# Patient Record
Sex: Male | Born: 1950 | Race: Black or African American | Hispanic: No | Smoking: Current every day smoker
Health system: Southern US, Community
[De-identification: ages and names within clinical notes are randomized; demographics above are authoritative.]

## PROBLEM LIST (undated history)

## (undated) DIAGNOSIS — M1990 Unspecified osteoarthritis, unspecified site: Secondary | ICD-10-CM

## (undated) DIAGNOSIS — I10 Essential (primary) hypertension: Secondary | ICD-10-CM

## (undated) DIAGNOSIS — I739 Peripheral vascular disease, unspecified: Secondary | ICD-10-CM

## (undated) DIAGNOSIS — E785 Hyperlipidemia, unspecified: Secondary | ICD-10-CM

## (undated) DIAGNOSIS — H409 Unspecified glaucoma: Secondary | ICD-10-CM

## (undated) HISTORY — PX: COLONOSCOPY W/ POLYPECTOMY: SHX1380

## (undated) HISTORY — PX: FOOT SURGERY: SHX648

---

## 2011-08-23 ENCOUNTER — Ambulatory Visit: Payer: Self-pay | Admitting: Physician Assistant

## 2011-08-23 VITALS — BP 126/83 | HR 67 | Temp 98.0°F | Resp 16 | Ht 68.0 in | Wt 145.2 lb

## 2011-08-23 DIAGNOSIS — Z0289 Encounter for other administrative examinations: Secondary | ICD-10-CM

## 2011-08-23 NOTE — Progress Notes (Signed)
Patient ID: DANIS PEMBLETON MRN: [id number], DOB: 1950-06-11 61 y.o. Date of Encounter: 08/23/2011, 12:46 PM  Primary Physician: No primary provider on file.  Chief Complaint: DOT Physical   HPI: 61 y.o. y/o male with history noted below here for DOT physical. Doing well. No issues/complaints. Recertification. 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 Drinks 2 beers per day after work. PCP [facility].   Review of Systems: Consitutional: No fever, chills, fatigue, night sweats, lymphadenopathy, or weight changes. Eyes: No visual changes, eye redness, or discharge. ENT/Mouth: Ears: No otalgia, tinnitus, hearing loss, discharge. Nose: No congestion, rhinorrhea, sinus pain, or epistaxis. Throat: No sore throat, post nasal drip, or teeth pain. Cardiovascular: No CP, palpitations, diaphoresis, DOE, edema, orthopnea, PND. Respiratory: No cough, hemoptysis, SOB, or wheezing. Gastrointestinal: No anorexia, dysphagia, reflux, pain, nausea, vomiting, hematemesis, diarrhea, constipation, BRBPR, or melena. Genitourinary: No dysuria, frequency, urgency, hematuria, incontinence, nocturia, decreased urinary stream, discharge, impotence, or testicular pain/masses. Musculoskeletal: No decreased ROM, myalgias, stiffness, joint swelling, or weakness. Skin: No rash, erythema, lesion changes, pain, warmth, jaundice, or pruritis. Neurological: No headache, dizziness, syncope, seizures, tremors, memory loss, coordination problems, or paresthesias. Psychological: No anxiety, depression, hallucinations, SI/HI. Endocrine: No fatigue, polydipsia, polyphagia, polyuria, or known diabetes. All other systems were reviewed and are otherwise negative.  No past medical history on file.   No past surgical history on file.  Home Meds:  Prior to Admission medications   Medication Sig Start Date End Date Taking? Authorizing Provider  aspirin 81 MG tablet Take 81 mg by mouth daily.   Yes  Historical Provider, MD    Allergies: No Known Allergies  History   Social History  . Marital Status: Single    Spouse Name: N/A    Number of Children: N/A  . Years of Education: N/A   Occupational History  . Not on file.   Social History Main Topics  . Smoking status: Current Everyday Smoker -- 1.0 packs/day    Types: Cigarettes  . Smokeless tobacco: Not on file  . Alcohol Use: Not on file  . Drug Use: Not on file  . Sexually Active: Not on file   Other Topics Concern  . Not on file   Social History Narrative  . No narrative on file    No family history on file.  Vision uncorrected: Right eye 20/30           Left eye 20/30           Both eyes 20/20 Peripheral vision : Right eye 85         Left eye 85 Hearing: Right ear: 10 feet      Left ear ear: 10 feet Urine: Sp. GR. 1.030 Protein: negative Blood: Trace Sugar: negatvie  Physical Exam: Blood pressure 126/83, pulse 67, temperature 98 F (36.7 C), temperature source Oral, resp. rate 16, height 5\' 8"  (1.727 m), weight 145 lb 3.2 oz (65.862 kg).  General: Well developed, well nourished, in no acute distress. HEENT: Normocephalic, atraumatic. Conjunctiva pink, sclera non-icteric. Pupils 2 mm constricting to 1 mm, round, regular, and equally reactive to light and accomodation. EOMI. Internal auditory canal clear. TMs with good cone of light and without pathology. Nasal mucosa pink. Nares are without discharge. No sinus tenderness. Oral mucosa pink. Dentition poor. Pharynx without exudate.   Neck: Supple. Trachea midline. No thyromegaly. Full ROM. No lymphadenopathy. Lungs: Clear to auscultation bilaterally without wheezes, rales, or rhonchi. Breathing is of normal effort  and unlabored. Cardiovascular: RRR with S1 S2. No murmurs, rubs, or gallops appreciated. Distal pulses 2+ symmetrically. No carotid or abdominal bruits. Abdomen: Soft, non-tender, non-distended with normoactive bowel sounds. No hepatosplenomegaly or  masses. No rebound/guarding. No CVA tenderness. Without hernias.  Genitourinary: Circumcised male. No penile lesions. Testes descended bilaterally, and smooth without tenderness or masses.  Musculoskeletal: Full range of motion and 5/5 strength throughout. Without swelling, atrophy, tenderness, crepitus, or warmth. Extremities without clubbing, cyanosis, or edema. Calves supple. Skin: Warm and moist without erythema, ecchymosis, wounds, or rash. Neuro: A+Ox3. CN II-XII grossly intact. Moves all extremities spontaneously. Full sensation throughout. Normal gait. DTR 2+ throughout upper and lower extremities. Finger to nose intact. Psych:  Responds to questions appropriately with a normal affect.    Assessment/Plan:  61 y.o. y/o male here for DOT physical. -Cleared -2 year card issued -Form completed -RTC prn -Follow up with [facility] for trace hematuria  Signed, Eula Listen, PA-C 08/23/2011 12:46 PM

## 2017-11-11 ENCOUNTER — Ambulatory Visit: Payer: Self-pay | Admitting: Cardiology

## 2017-11-14 ENCOUNTER — Ambulatory Visit (INDEPENDENT_AMBULATORY_CARE_PROVIDER_SITE_OTHER): Payer: No Typology Code available for payment source | Admitting: Cardiovascular Disease

## 2017-11-14 ENCOUNTER — Encounter: Payer: Self-pay | Admitting: Cardiovascular Disease

## 2017-11-14 VITALS — BP 117/78 | HR 84 | Ht 67.0 in | Wt 127.6 lb

## 2017-11-14 DIAGNOSIS — E7800 Pure hypercholesterolemia, unspecified: Secondary | ICD-10-CM

## 2017-11-14 DIAGNOSIS — I10 Essential (primary) hypertension: Secondary | ICD-10-CM

## 2017-11-14 DIAGNOSIS — Z720 Tobacco use: Secondary | ICD-10-CM | POA: Diagnosis not present

## 2017-11-14 DIAGNOSIS — E785 Hyperlipidemia, unspecified: Secondary | ICD-10-CM | POA: Insufficient documentation

## 2017-11-14 DIAGNOSIS — I739 Peripheral vascular disease, unspecified: Secondary | ICD-10-CM

## 2017-11-14 NOTE — Assessment & Plan Note (Signed)
History of mild hyperlipidemia not on statin therapy 

## 2017-11-14 NOTE — Progress Notes (Signed)
11/14/2017 Nathan SpillerWilliam R Caillier   Oct 28, 1950  [id number]  [facility] Primary Cardiologist: Runell GessJonathan J Berry MD Roseanne RenoFACP, FACC, FAHA, FSCAI  HPI:  Nathan Palmer is a 67 y.o. thin appearing divorced African-American male father of 3, grandfather one grandchild referred by the [facility] for evaluation of claudication.  He has a history of treated hypertension, untreated mild hyperlipidemia and greater than 50 pack years of tobacco abuse currently smoking 1 pack/day.  There is no family history of heart disease.  Never had a heart attack or stroke and denies chest pain or shortness of breath.  He has complained of right greater than left lower extremity lifestyle limiting claudication less than 1 block for the last year and was referred here for further evaluation of this.   Current Meds  Medication Sig  . acetaminophen (TYLENOL) 500 MG tablet Take 500 mg by mouth daily as needed.  Marland Kitchen. aspirin 81 MG tablet Take 81 mg by mouth daily.  Marland Kitchen. gabapentin (NEURONTIN) 300 MG capsule Take 300 mg by mouth 2 (two) times daily.  . hydrochlorothiazide (HYDRODIURIL) 25 MG tablet Take 25 mg by mouth daily.  . salsalate (DISALCID) 750 MG tablet Take 750 mg by mouth daily as needed.     No Known Allergies  Social History   Socioeconomic History  . Marital status: Single    Spouse name: Not on file  . Number of children: Not on file  . Years of education: Not on file  . Highest education level: Not on file  Occupational History  . Not on file  Social Needs  . Financial resource strain: Not on file  . Food insecurity:    Worry: Not on file    Inability: Not on file  . Transportation needs:    Medical: Not on file    Non-medical: Not on file  Tobacco Use  . Smoking status: Current Every Day Smoker    Packs/day: 1.00    Types: Cigarettes  . Smokeless tobacco: Never Used  Substance and Sexual Activity  . Alcohol use: Not on file  . Drug use: Not on  file  . Sexual activity: Not on file  Lifestyle  . Physical activity:    Days per week: Not on file    Minutes per session: Not on file  . Stress: Not on file  Relationships  . Social connections:    Talks on phone: Not on file    Gets together: Not on file    Attends religious service: Not on file    Active member of club or organization: Not on file    Attends meetings of clubs or organizations: Not on file    Relationship status: Not on file  . Intimate partner violence:    Fear of current or ex partner: Not on file    Emotionally abused: Not on file    Physically abused: Not on file    Forced sexual activity: Not on file  Other Topics Concern  . Not on file  Social History Narrative  . Not on file     Review of Systems: General: negative for chills, fever, night sweats or weight changes.  Cardiovascular: negative for chest pain, dyspnea on exertion, edema, orthopnea, palpitations, paroxysmal nocturnal dyspnea or shortness of breath Dermatological: negative for rash Respiratory: negative for cough or wheezing Urologic: negative for hematuria Abdominal: negative for nausea, vomiting, diarrhea, bright red blood per rectum, melena, or hematemesis Neurologic: negative for visual  changes, syncope, or dizziness All other systems reviewed and are otherwise negative except as noted above.    Blood pressure 117/78, pulse 84, height 5\' 7"  (1.702 m), weight 127 lb 9.6 oz (57.9 kg).  General appearance: alert and no distress Neck: no adenopathy, no carotid bruit, no JVD, supple, symmetrical, trachea midline and thyroid not enlarged, symmetric, no tenderness/mass/nodules Lungs: clear to auscultation bilaterally Heart: regular rate and rhythm, S1, S2 normal, no murmur, click, rub or gallop Extremities: extremities normal, atraumatic, no cyanosis or edema Pulses: 2+ and symmetric Skin: Skin color, texture, turgor normal. No rashes or lesions Neurologic: Alert and oriented X 3,  normal strength and tone. Normal symmetric reflexes. Normal coordination and gait  EKG sinus rhythm 84 without ST or T wave changes.  There were septal Q waves.  I personally reviewed this EKG.  ASSESSMENT AND PLAN:   Claudication in peripheral vascular disease (HCC) History of lifestyle limiting claudication right greater than left for greater than 1 year.  We will repeat lower extremity arterial Doppler studies.  Essential hypertension History of essential hypertension with blood pressure measured today 117/78.  He is on HydroDIURIL.  Hyperlipidemia History of mild hyperlipidemia not on statin therapy.  Tobacco abuse History of ongoing tobacco abuse of 1 pack/day for last 50 years.      Runell Gess MD FACP,FACC,FAHA, [facility] 11/14/2017 2:04 PM

## 2017-11-14 NOTE — Assessment & Plan Note (Signed)
History of ongoing tobacco abuse of 1 pack/day for last 50 years.

## 2017-11-14 NOTE — Assessment & Plan Note (Signed)
History of lifestyle limiting claudication right greater than left for greater than 1 year.  We will repeat lower extremity arterial Doppler studies.

## 2017-11-14 NOTE — Patient Instructions (Signed)
Medication Instructions:   NO CHANGE  Testing/Procedures:  Your physician has requested that you have a lower extremity arterial duplex. This test is an ultrasound of the arteries in the legs or arms. It looks at arterial blood flow in the legs and arms. Allow one hour for Lower and Upper Arterial scans. There are no restrictions or special instructions   Follow-Up:  Your physician recommends that you schedule a follow-up appointment in: WITH DR BERRY AFTER TESTING COMPLETE

## 2017-11-14 NOTE — Assessment & Plan Note (Signed)
History of essential hypertension with blood pressure measured today 117/78.  He is on HydroDIURIL.

## 2017-11-21 ENCOUNTER — Other Ambulatory Visit: Payer: Self-pay | Admitting: Cardiovascular Disease

## 2017-11-21 DIAGNOSIS — I739 Peripheral vascular disease, unspecified: Secondary | ICD-10-CM

## 2017-12-01 ENCOUNTER — Ambulatory Visit (HOSPITAL_COMMUNITY)
Admission: RE | Admit: 2017-12-01 | Discharge: 2017-12-01 | Disposition: A | Discharge status: Home / Self Care | Payer: No Typology Code available for payment source | Source: Ambulatory Visit | Attending: Cardiovascular Disease | Admitting: Cardiovascular Disease

## 2017-12-01 ENCOUNTER — Telehealth: Payer: Self-pay | Admitting: Cardiovascular Disease

## 2017-12-01 ENCOUNTER — Inpatient Hospital Stay (HOSPITAL_COMMUNITY): Admission: RE | Admit: 2017-12-01 | Payer: Self-pay | Source: Ambulatory Visit

## 2017-12-01 DIAGNOSIS — I739 Peripheral vascular disease, unspecified: Secondary | ICD-10-CM | POA: Insufficient documentation

## 2017-12-01 NOTE — Telephone Encounter (Signed)
[facility] on 12/01/17, Appt 12/03/17 @ 10:00AM. NV

## 2017-12-03 ENCOUNTER — Other Ambulatory Visit (HOSPITAL_COMMUNITY): Payer: Self-pay

## 2017-12-03 ENCOUNTER — Other Ambulatory Visit (HOSPITAL_COMMUNITY): Payer: Self-pay | Admitting: Cardiovascular Disease

## 2017-12-03 ENCOUNTER — Telehealth: Payer: Self-pay

## 2017-12-03 ENCOUNTER — Encounter: Payer: Self-pay | Admitting: Cardiovascular Disease

## 2017-12-03 ENCOUNTER — Ambulatory Visit (HOSPITAL_COMMUNITY)
Admission: RE | Admit: 2017-12-03 | Discharge: 2017-12-03 | Disposition: A | Discharge status: Home / Self Care | Payer: No Typology Code available for payment source | Source: Ambulatory Visit | Attending: Cardiovascular Disease | Admitting: Cardiovascular Disease

## 2017-12-03 ENCOUNTER — Ambulatory Visit (INDEPENDENT_AMBULATORY_CARE_PROVIDER_SITE_OTHER): Payer: Non-veteran care | Admitting: Cardiovascular Disease

## 2017-12-03 ENCOUNTER — Other Ambulatory Visit: Payer: Self-pay | Admitting: Cardiovascular Disease

## 2017-12-03 DIAGNOSIS — I739 Peripheral vascular disease, unspecified: Secondary | ICD-10-CM | POA: Insufficient documentation

## 2017-12-03 LAB — CBC
HEMATOCRIT: 40.6 % (ref 37.5–51.0)
HEMOGLOBIN: 14.1 g/dL (ref 13.0–17.7)
MCH: 32.9 pg (ref 26.6–33.0)
MCHC: 34.7 g/dL (ref 31.5–35.7)
MCV: 95 fL (ref 79–97)
PLATELETS: 189 10*3/uL (ref 150–450)
RBC: 4.29 x10E6/uL (ref 4.14–5.80)
RDW: 13.5 % (ref 12.3–15.4)
WBC: 5 10*3/uL (ref 3.4–10.8)

## 2017-12-03 LAB — BASIC METABOLIC PANEL
BUN / CREAT RATIO: 16 (ref 10–24)
BUN: 17 mg/dL (ref 8–27)
CALCIUM: 9.8 mg/dL (ref 8.6–10.2)
CHLORIDE: 105 mmol/L (ref 96–106)
CO2: 25 mmol/L (ref 20–29)
CREATININE: 1.06 mg/dL (ref 0.76–1.27)
GFR, EST AFRICAN AMERICAN: 84 mL/min/{1.73_m2} (ref >59)
GFR, EST NON AFRICAN AMERICAN: 72 mL/min/{1.73_m2} (ref >59)
Glucose: 90 mg/dL (ref 65–99)
Potassium: 5.3 mmol/L — ABNORMAL HIGH (ref 3.5–5.2)
Sodium: 139 mmol/L (ref 134–144)

## 2017-12-03 NOTE — H&P (View-Only) (Signed)
Mr. Nathan Palmer returns today for follow-up of his Doppler studies performed in evaluation of lifestyle limiting right lower extremely claudication.  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  His left leg without significant disease.  The plan is to perform angiography and potential endovascular therapy next week.  Runell GessJonathan J. Shakenya Stoneberg, M.D., FACP, [facility]FACC, Earl LagosFAHA, [facility]SCAI [facility] [address] [place], [place]  [address]  [phone] 12/03/2017 10:42 AM

## 2017-12-03 NOTE — Patient Instructions (Addendum)
    [facility] [address] [place] [address] Dept: [phone] Loc: [phone]  Levonne SpillerWilliam R Kleppe  12/03/2017  You are scheduled for a Peripheral Angiogram on Thursday, August 21 with Dr. Nanetta BattyJonathan Berry.  1. [facility]: [address] [place], [place] [address] at 9:30 AM (This time is two hours before your procedure to ensure your preparation). Free valet parking service is available.   Special note: Every effort is made to have your procedure done on time. Please understand that emergencies sometimes delay scheduled procedures.  2. Diet: Do not eat solid foods after midnight.  The patient may have clear liquids until 5am upon the day of the procedure.  3. Labs: You will need to have blood drawn on TODAY  4. Medication instructions in preparation for your procedure:  On the morning of your procedure, take your Aspirin and any morning medicines NOT listed above.  You may use sips of water.  5. Plan for one night stay--bring personal belongings. 6. Bring a current list of your medications and current insurance cards. 7. You MUST have a responsible person to drive you home. 8. Someone MUST be with you the first 24 hours after you arrive home or your discharge will be delayed. 9. Please wear clothes that are easy to get on and off and wear slip-on shoes.  Thank you for allowing us to care for you!   -- [facility]    DOPPLERS ONE WEEK POST PROCEDURE   Your physician recommends that you schedule a follow-up appointment in: 2-3 WEEKS POST PROCEDURE WITH DR Allyson SabalBERRY

## 2017-12-03 NOTE — Assessment & Plan Note (Signed)
Mr. Nathan Palmer returns today for follow-up of his Doppler studies performed in evaluation of lifestyle limiting right lower extremely claudication.  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  His left leg without significant disease.  The plan is to perform angiography and potential endovascular therapy next week.

## 2017-12-03 NOTE — Telephone Encounter (Signed)
RECEIVED CALL

## 2017-12-03 NOTE — Progress Notes (Signed)
Mr. Humbarger returns today for follow-up of his Doppler studies performed in evaluation of lifestyle limiting right lower extremely claudication.  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  His left leg without significant disease.  The plan is to perform angiography and potential endovascular therapy next week.  Vandell Kun J. Redonna Wilbert, M.D., FACP, FACC, FAHA, FSCAI [facility] [address] [place], [place]  [address]  [phone] 12/03/2017 10:42 AM  

## 2017-12-04 ENCOUNTER — Ambulatory Visit (HOSPITAL_COMMUNITY)
Admission: RE | Admit: 2017-12-04 | Discharge: 2017-12-05 | Disposition: A | Discharge status: Home / Self Care | Payer: No Typology Code available for payment source | Source: Ambulatory Visit | Attending: Cardiovascular Disease | Admitting: Cardiovascular Disease

## 2017-12-04 ENCOUNTER — Ambulatory Visit (HOSPITAL_COMMUNITY)
Admission: RE | Disposition: A | Discharge status: Home / Self Care | Payer: Self-pay | Source: Ambulatory Visit | Attending: Cardiovascular Disease

## 2017-12-04 ENCOUNTER — Telehealth: Payer: Self-pay | Admitting: Surgery

## 2017-12-04 ENCOUNTER — Encounter (HOSPITAL_COMMUNITY): Payer: Self-pay | Admitting: *Deleted

## 2017-12-04 ENCOUNTER — Other Ambulatory Visit: Payer: Self-pay

## 2017-12-04 DIAGNOSIS — Z7902 Long term (current) use of antithrombotics/antiplatelets: Secondary | ICD-10-CM | POA: Insufficient documentation

## 2017-12-04 DIAGNOSIS — Z7982 Long term (current) use of aspirin: Secondary | ICD-10-CM | POA: Insufficient documentation

## 2017-12-04 DIAGNOSIS — I70213 Atherosclerosis of native arteries of extremities with intermittent claudication, bilateral legs: Secondary | ICD-10-CM | POA: Insufficient documentation

## 2017-12-04 DIAGNOSIS — F17210 Nicotine dependence, cigarettes, uncomplicated: Secondary | ICD-10-CM | POA: Insufficient documentation

## 2017-12-04 DIAGNOSIS — I739 Peripheral vascular disease, unspecified: Secondary | ICD-10-CM | POA: Diagnosis present

## 2017-12-04 DIAGNOSIS — I70212 Atherosclerosis of native arteries of extremities with intermittent claudication, left leg: Secondary | ICD-10-CM

## 2017-12-04 DIAGNOSIS — Z23 Encounter for immunization: Secondary | ICD-10-CM | POA: Insufficient documentation

## 2017-12-04 DIAGNOSIS — I10 Essential (primary) hypertension: Secondary | ICD-10-CM | POA: Insufficient documentation

## 2017-12-04 DIAGNOSIS — E785 Hyperlipidemia, unspecified: Secondary | ICD-10-CM | POA: Diagnosis not present

## 2017-12-04 HISTORY — DX: Hyperlipidemia, unspecified: E78.5

## 2017-12-04 HISTORY — PX: ABDOMINAL AORTOGRAM W/LOWER EXTREMITY: CATH118223

## 2017-12-04 HISTORY — DX: Essential (primary) hypertension: I10

## 2017-12-04 HISTORY — PX: PERIPHERAL VASCULAR INTERVENTION: CATH118257

## 2017-12-04 HISTORY — DX: Peripheral vascular disease, unspecified: I73.9

## 2017-12-04 HISTORY — PX: ABDOMINAL AORTAGRAM: SHX5706

## 2017-12-04 LAB — POCT ACTIVATED CLOTTING TIME
ACTIVATED CLOTTING TIME: 246 s
Activated Clotting Time: 285 seconds
Activated Clotting Time: 191 seconds
Activated Clotting Time: 164 seconds

## 2017-12-04 SURGERY — ABDOMINAL AORTOGRAM W/LOWER EXTREMITY
Anesthesia: LOCAL

## 2017-12-04 MED ORDER — NITROGLYCERIN 1 MG/10 ML FOR IR/CATH LAB
INTRA_ARTERIAL | Status: AC
  Filled 2017-12-04: qty 10

## 2017-12-04 MED ORDER — LABETALOL HCL 5 MG/ML IV SOLN: 10.0000 mg | INTRAVENOUS | Status: DC | PRN

## 2017-12-04 MED ORDER — SODIUM CHLORIDE 0.9 % IV SOLN: 250.0000 mL | INTRAVENOUS | Status: DC | PRN

## 2017-12-04 MED ORDER — ATORVASTATIN CALCIUM 80 MG PO TABS
80.0000 mg | ORAL_TABLET | Freq: Every day | ORAL | Status: DC
  Administered 2017-12-04: 80 mg via ORAL
  Filled 2017-12-04: qty 1

## 2017-12-04 MED ORDER — HYDRALAZINE HCL 20 MG/ML IJ SOLN: 5.0000 mg | INTRAMUSCULAR | Status: DC | PRN

## 2017-12-04 MED ORDER — SODIUM CHLORIDE 0.9 % IV SOLN: INTRAVENOUS | Status: AC

## 2017-12-04 MED ORDER — SODIUM CHLORIDE 0.9 % WEIGHT BASED INFUSION: 1.0000 mL/kg/h | INTRAVENOUS | Status: DC

## 2017-12-04 MED ORDER — HYDROCHLOROTHIAZIDE 25 MG PO TABS
25.0000 mg | ORAL_TABLET | Freq: Every day | ORAL | Status: DC
  Administered 2017-12-05: 25 mg via ORAL
  Filled 2017-12-04: qty 1

## 2017-12-04 MED ORDER — ACETAMINOPHEN 325 MG PO TABS: 650.0000 mg | ORAL_TABLET | ORAL | Status: DC | PRN

## 2017-12-04 MED ORDER — CLOPIDOGREL BISULFATE 300 MG PO TABS
ORAL_TABLET | ORAL | Status: DC | PRN
  Administered 2017-12-04: 300 mg via ORAL

## 2017-12-04 MED ORDER — FENTANYL CITRATE (PF) 100 MCG/2ML IJ SOLN
INTRAMUSCULAR | Status: AC
  Filled 2017-12-04: qty 2

## 2017-12-04 MED ORDER — CLOPIDOGREL BISULFATE 300 MG PO TABS
ORAL_TABLET | ORAL | Status: AC
  Filled 2017-12-04: qty 1

## 2017-12-04 MED ORDER — ONDANSETRON HCL 4 MG/2ML IJ SOLN: 4.0000 mg | Freq: Four times a day (QID) | INTRAMUSCULAR | Status: DC | PRN

## 2017-12-04 MED ORDER — SODIUM CHLORIDE 0.9% FLUSH: 3.0000 mL | INTRAVENOUS | Status: DC | PRN

## 2017-12-04 MED ORDER — MIDAZOLAM HCL 2 MG/2ML IJ SOLN
INTRAMUSCULAR | Status: AC
  Filled 2017-12-04: qty 2

## 2017-12-04 MED ORDER — SODIUM CHLORIDE 0.9% FLUSH: 3.0000 mL | Freq: Two times a day (BID) | INTRAVENOUS | Status: DC

## 2017-12-04 MED ORDER — LIDOCAINE HCL (PF) 1 % IJ SOLN
INTRAMUSCULAR | Status: AC
  Filled 2017-12-04: qty 30

## 2017-12-04 MED ORDER — ASPIRIN EC 81 MG PO TBEC
81.0000 mg | DELAYED_RELEASE_TABLET | Freq: Every day | ORAL | Status: DC
  Administered 2017-12-05: 81 mg via ORAL
  Filled 2017-12-04: qty 1

## 2017-12-04 MED ORDER — ANGIOPLASTY BOOK
Freq: Once | Status: AC
  Administered 2017-12-04: 21:00:00 1
  Filled 2017-12-04: qty 1

## 2017-12-04 MED ORDER — HEPARIN SODIUM (PORCINE) 1000 UNIT/ML IJ SOLN
INTRAMUSCULAR | Status: DC | PRN
  Administered 2017-12-04: 2500 [IU] via INTRAVENOUS
  Administered 2017-12-04: 7000 [IU] via INTRAVENOUS

## 2017-12-04 MED ORDER — FENTANYL CITRATE (PF) 100 MCG/2ML IJ SOLN
INTRAMUSCULAR | Status: DC | PRN
  Administered 2017-12-04: 25 ug via INTRAVENOUS

## 2017-12-04 MED ORDER — HEPARIN (PORCINE) IN NACL 1000-0.9 UT/500ML-% IV SOLN
INTRAVENOUS | Status: DC | PRN
  Administered 2017-12-04 (×2): 500 mL

## 2017-12-04 MED ORDER — ASPIRIN 81 MG PO CHEW: 81.0000 mg | CHEWABLE_TABLET | ORAL | Status: DC

## 2017-12-04 MED ORDER — ENSURE ENLIVE PO LIQD
237.0000 mL | Freq: Two times a day (BID) | ORAL | Status: DC
  Administered 2017-12-05: 237 mL via ORAL
  Filled 2017-12-04 (×4): qty 237

## 2017-12-04 MED ORDER — MIDAZOLAM HCL 2 MG/2ML IJ SOLN
INTRAMUSCULAR | Status: DC | PRN
  Administered 2017-12-04: 1 mg via INTRAVENOUS

## 2017-12-04 MED ORDER — LIDOCAINE HCL (PF) 1 % IJ SOLN
INTRAMUSCULAR | Status: DC | PRN
  Administered 2017-12-04: 15 mL via INTRADERMAL

## 2017-12-04 MED ORDER — HEPARIN (PORCINE) IN NACL 1000-0.9 UT/500ML-% IV SOLN
INTRAVENOUS | Status: AC
  Filled 2017-12-04: qty 1000

## 2017-12-04 MED ORDER — HEPARIN SODIUM (PORCINE) 1000 UNIT/ML IJ SOLN
INTRAMUSCULAR | Status: AC
  Filled 2017-12-04: qty 1

## 2017-12-04 MED ORDER — CLOPIDOGREL BISULFATE 75 MG PO TABS
75.0000 mg | ORAL_TABLET | Freq: Every day | ORAL | Status: DC
  Administered 2017-12-05: 75 mg via ORAL
  Filled 2017-12-04: qty 1

## 2017-12-04 MED ORDER — IODIXANOL 320 MG/ML IV SOLN
INTRAVENOUS | Status: DC | PRN
  Administered 2017-12-04: 200 mL via INTRA_ARTERIAL

## 2017-12-04 MED ORDER — PNEUMOCOCCAL VAC POLYVALENT 25 MCG/0.5ML IJ INJ
0.5000 mL | INJECTION | INTRAMUSCULAR | Status: AC
  Administered 2017-12-05: 0.5 mL via INTRAMUSCULAR
  Filled 2017-12-04: qty 0.5

## 2017-12-04 MED ORDER — MORPHINE SULFATE (PF) 10 MG/ML IV SOLN: 2.0000 mg | INTRAVENOUS | Status: DC | PRN

## 2017-12-04 MED ORDER — SODIUM CHLORIDE 0.9 % WEIGHT BASED INFUSION
3.0000 mL/kg/h | INTRAVENOUS | Status: DC
  Administered 2017-12-04: 3 mL/kg/h via INTRAVENOUS

## 2017-12-04 MED ORDER — ASPIRIN 81 MG PO TABS: 81.0000 mg | ORAL_TABLET | Freq: Every day | ORAL | Status: DC

## 2017-12-04 MED ORDER — ACETAMINOPHEN 500 MG PO TABS: 1000.0000 mg | ORAL_TABLET | Freq: Three times a day (TID) | ORAL | Status: DC | PRN

## 2017-12-04 MED ORDER — NITROGLYCERIN IN D5W 200-5 MCG/ML-% IV SOLN
INTRAVENOUS | Status: AC
  Filled 2017-12-04: qty 250

## 2017-12-04 SURGICAL SUPPLY — 27 items
BALLN MUSTANG 9X40X75 (BALLOONS) ×3
BALLOON MUSTANG 9X40X75 (BALLOONS) IMPLANT
CATH ANGIO 5F PIGTAIL 65CM (CATHETERS) ×2 IMPLANT
CATH STRAIGHT 5FR 65CM (CATHETERS) ×2 IMPLANT
DEVICE CONTINUOUS FLUSH (MISCELLANEOUS) ×1 IMPLANT
DIAMONDBACK SOLID OAS 2.0MM (CATHETERS) ×3
KIT ENCORE 26 ADVANTAGE (KITS) ×1 IMPLANT
KIT PV (KITS) ×3 IMPLANT
LUBRICANT VIPERSLIDE CORONARY (MISCELLANEOUS) ×1 IMPLANT
SHEATH BRITE TIP 7FR 35CM (SHEATH) ×1 IMPLANT
SHEATH BRITE TIP 8FR 35CM (SHEATH) ×1 IMPLANT
SHEATH PINNACLE 5F 10CM (SHEATH) ×1 IMPLANT
SHEATH PINNACLE 7F 10CM (SHEATH) IMPLANT
SHEATH PINNACLE 8F 10CM (SHEATH) ×1 IMPLANT
STENT EPIC VASCULAR 12X40X75 (Permanent Stent) ×1 IMPLANT
STENT VIABAHNBX 10X29X135 (Permanent Stent) ×1 IMPLANT
STOPCOCK MORSE 400PSI 3WAY (MISCELLANEOUS) ×1 IMPLANT
SYRINGE MEDRAD AVANTA MACH 7 (SYRINGE) ×1 IMPLANT
SYSTEM DIMNDBCK SLD OAS 2.0MM (CATHETERS) IMPLANT
TAPE VIPERTRACK RADIOPAQ (MISCELLANEOUS) IMPLANT
TAPE VIPERTRACK RADIOPAQUE (MISCELLANEOUS) ×3
TRANSDUCER W/STOPCOCK (MISCELLANEOUS) ×3 IMPLANT
TRAY PV CATH (CUSTOM PROCEDURE TRAY) ×3 IMPLANT
TUBING CIL FLEX 10 FLL-RA (TUBING) ×1 IMPLANT
WIRE HITORQ VERSACORE ST 145CM (WIRE) ×1 IMPLANT
WIRE VERSACORE LOC 115CM (WIRE) ×1 IMPLANT
WIRE VIPER ADVANCE .017X335CM (WIRE) ×1 IMPLANT

## 2017-12-04 NOTE — Progress Notes (Addendum)
628fr sheath aspirated and removed from LFA, manual pressure applied for 25 minutes. Groin level 0 tegaderm dressing applied, bedrest instructions given. Bilateral dp and pt pulses present per Vernona RiegerLaura.  Bedrest begins at 17:05:00

## 2017-12-04 NOTE — Interval H&P Note (Signed)
History and Physical Interval Note:  12/04/2017 12:28 PM  Nathan SpillerWilliam R Chizek  has presented today for surgery, with the diagnosis of pad  The various methods of treatment have been discussed with the patient and family. After consideration of risks, benefits and other options for treatment, the patient has consented to  Procedure(s): ABDOMINAL AORTOGRAM W/LOWER EXTREMITY (N/A) as a surgical intervention .  The patient's history has been reviewed, patient examined, no change in status, stable for surgery.  I have reviewed the patient's chart and labs.  Questions were answered to the patient's satisfaction.     Nanetta BattyJonathan Berry

## 2017-12-04 NOTE — Telephone Encounter (Signed)
sch appt phone NA mld ltr 01/05/18 830am New Pt

## 2017-12-05 ENCOUNTER — Encounter (HOSPITAL_COMMUNITY): Payer: Self-pay | Admitting: Cardiovascular Disease

## 2017-12-05 DIAGNOSIS — I739 Peripheral vascular disease, unspecified: Secondary | ICD-10-CM

## 2017-12-05 DIAGNOSIS — I70213 Atherosclerosis of native arteries of extremities with intermittent claudication, bilateral legs: Secondary | ICD-10-CM | POA: Diagnosis not present

## 2017-12-05 LAB — CBC
HCT: 34.1 % — ABNORMAL LOW (ref 39.0–52.0)
Hemoglobin: 11.7 g/dL — ABNORMAL LOW (ref 13.0–17.0)
MCH: 33.3 pg (ref 26.0–34.0)
MCHC: 34.3 g/dL (ref 30.0–36.0)
MCV: 97.2 fL (ref 78.0–100.0)
PLATELETS: 143 10*3/uL — AB (ref 150–400)
RBC: 3.51 MIL/uL — ABNORMAL LOW (ref 4.22–5.81)
RDW: 12.9 % (ref 11.5–15.5)
WBC: 5.5 10*3/uL (ref 4.0–10.5)

## 2017-12-05 LAB — BASIC METABOLIC PANEL
Anion gap: 8 (ref 5–15)
BUN: 11 mg/dL (ref 8–23)
CALCIUM: 8 mg/dL — AB (ref 8.9–10.3)
CO2: 24 mmol/L (ref 22–32)
CREATININE: 0.86 mg/dL (ref 0.61–1.24)
Chloride: 106 mmol/L (ref 98–111)
GFR calc Af Amer: >60 mL/min (ref >60)
GFR calc non Af Amer: >60 mL/min (ref >60)
GLUCOSE: 91 mg/dL (ref 70–99)
Potassium: 3.5 mmol/L (ref 3.5–5.1)
Sodium: 138 mmol/L (ref 135–145)

## 2017-12-05 MED ORDER — ATORVASTATIN CALCIUM 80 MG PO TABS: 80.0000 mg | ORAL_TABLET | Freq: Every day | ORAL | 3 refills | Status: AC

## 2017-12-05 MED ORDER — CLOPIDOGREL BISULFATE 75 MG PO TABS: 75.0000 mg | ORAL_TABLET | Freq: Every day | ORAL | 3 refills | Status: AC

## 2017-12-05 MED FILL — Nitroglycerin IV Soln 100 MCG/ML in D5W: INTRA_ARTERIAL | Qty: 10 | Status: AC

## 2017-12-05 MED FILL — Nitroglycerin IV Soln 200 MCG/ML in D5W: INTRAVENOUS | Qty: 250 | Status: AC

## 2017-12-05 NOTE — Progress Notes (Signed)
Progress Note  Patient Name: Nathan Palmer Date of Encounter: 12/05/2017  Primary Cardiologist: Nanetta Batty, MD   Subjective    Feeling well. No chest pain, sob or palpitations.   Inpatient Medications    Scheduled Meds: . aspirin EC  81 mg Oral Daily  . atorvastatin  80 mg Oral q1800  . clopidogrel  75 mg Oral Q breakfast  . feeding supplement (ENSURE ENLIVE)  237 mL Oral BID BM  . hydrochlorothiazide  25 mg Oral Daily  . pneumococcal 23 valent vaccine  0.5 mL Intramuscular Tomorrow-1000  . sodium chloride flush  3 mL Intravenous Q12H   Continuous Infusions: . sodium chloride     PRN Meds: sodium chloride, acetaminophen, hydrALAZINE, labetalol, morphine injection, ondansetron (ZOFRAN) IV, sodium chloride flush   Vital Signs    Vitals:   12/04/17 2100 12/05/17 0236 12/05/17 0300 12/05/17 0616  BP: 137/79 113/82 120/75 124/83  Pulse: 82 79 72 78  Resp: (!) 22 13 14 16   Temp:  98.5 F (36.9 C)  98.6 F (37 C)  TempSrc:  Oral  Oral  SpO2: 99% 96% 95% 96%  Weight:    56.7 kg  Height:        Intake/Output Summary (Last 24 hours) at 12/05/2017 0802 Last data filed at 12/05/2017 0237 Gross per 24 hour  Intake 693.75 ml  Output 1025 ml  Net -331.25 ml   Filed Weights   12/04/17 0843 12/05/17 0616  Weight: 58.5 kg 56.7 kg    Telemetry    SR  ECG    N/A  Physical Exam   GEN: No acute distress.   Neck: No JVD Cardiac: RRR, no murmurs, rubs, or gallops. R groin stable Respiratory: Clear to auscultation bilaterally. GI: Soft, nontender, non-distended  MS: No edema; No deformity. Neuro:  Nonfocal  Psych: Normal affect   Labs    Chemistry Recent Labs  Lab 12/03/17 1143 12/05/17 0232  NA 139 138  K 5.3* 3.5  CL 105 106  CO2 25 24  GLUCOSE 90 91  BUN 17 11  CREATININE 1.06 0.86  CALCIUM 9.8 8.0*  GFRNONAA 72 >60  GFRAA 84 >60  ANIONGAP  --  8     Hematology Recent Labs  Lab 12/03/17 1143 12/05/17 0232  WBC 5.0 5.5  RBC 4.29  3.51*  HGB 14.1 11.7*  HCT 40.6 34.1*  MCV 95 97.2  MCH 32.9 33.3  MCHC 34.7 34.3  RDW 13.5 12.9  PLT 189 143*    Radiology    Dg Chest 2 View  Result Date: 12/03/2017 CLINICAL DATA:  Preop stent placement. EXAM: CHEST - 2 VIEW COMPARISON:  None. FINDINGS: The heart size and mediastinal contours are within normal limits. Both lungs are clear. The visualized skeletal structures are unremarkable. IMPRESSION: No active cardiopulmonary disease. Electronically Signed   By: Elige Ko   On: 12/03/2017 14:19    Cardiac Studies   PV Angiogram/Intervention Procedures Performed:               1.  Abdominal aortogram/bilateral iliac angiogram/bifemoral runoff               2.  Diamondback orbital rotational atherectomy left common iliac and external iliac artery               3.  VBX covered stenting left common iliac artery               4.  Epic nitinol self-expanding stenting left external  iliac artery.  PROCEDURE DESCRIPTION:   The patient was brought to the second floor [facility]iac cath lab in the the postabsorptive state. He was premedicated with IV Versed and fentanyl. His left groin was prepped and shaved in usual sterile fashion. Xylocaine 1% was used for local anesthesia. A 5 French sheath was inserted into the left common femoral artery using standard Seldinger technique.  A 5 French pigtail catheter was placed in the distal abdominal aorta.  Distal abdominal aortography, bilateral iliac angiography with bifemoral runoff was performed using bolus chase, digital subtraction and step table technique.  Omnipaque dye was used for the entirety of the case.  Retrograde aortic pressure was monitored during the case.    Angiographic Data:   1: Abdominal aorta- fluoroscopically calcified but not aneurysmal or obstructive 2: Left lower extremity- calcified 80% diffuse segmental proximal and mid left common iliac artery stenosis, 90% calcified proximal left external iliac artery  stenosis with three-vessel runoff 3: Right lower extremity- diffuse 90% calcified right common and external iliac artery, occluded right common femoral and proximal right SFA with a calcified plaque, 90% focal mid right SFA and 95% calcified popliteal artery in the P2 segment with three-vessel runoff  IMPRESSION: Nathan Palmer has highly calcified iliac vessels bilaterally with an occluded right common femoral and proximal right SFA.  He has a high-grade focal calcified mid right SFA and popliteal artery with three-vessel runoff bilaterally.  He complains of right greater than left lower extremity claudication.  I reviewed the angiographic with Dr. Myra Gianotti who agrees that he will need surgical revascularization of his right common femoral artery with endarterectomy patch angioplasty and most likely orbital atherectomy of his right common and external iliac artery.  I will performed orbital atherectomy of the left common and external iliac artery with covered stenting of his common and nitinol self-expanding stenting of his left external iliac artery preserving his hypogastric artery.  Procedure Description: Patient received a total of 9500 units of heparin with an ACT of 285.  Total contrast measured the patient was 200 cc.  He did receive 300 mg of p.o. Plavix at the end of the case.  I placed an 8 French bright tip sheath in his mid left external iliac artery.  I put a Viper wire in the infra renal abdominal aorta and performed orbital atherectomy with a 2 mm solid bur up to 120,000 RPMs performing multiple passes.  I then used a 10 mm x 29 mm VBX covered stent to stent the proximal left common iliac artery.  I overlapped a 10 mm x 40 mm [employer] epic nitinol self-expanding stent in the distal left common and proximal left external iliac artery and postdilated with a 9 mm x 4 cm balloon resulting in reduction of 80% left common and 90% left external iliac artery stenosis to 0% residual.  Patient  tolerated the procedure well.  A distal abdominal aortogram was performed at the end of the case using the pigtail catheter.  The bright tip sheath with then exchanged over the 035 wire for a short 8 French sheath which was secured in place.  Final Impression: Successful diamondback orbital rotational atherectomy of the highly calcified high-grade segmentally occluded left common and external iliac artery with covered stenting using a VBX covered stent of the proximal left common iliac and nitinol self-expanding stent of the left external iliac artery post dilating with a 9 mm balloon.  The sheath will be removed once ACT falls below 170 and  pressure held.  Patient will be discharged home in the morning and will see me back in the office next week.  He will get lower extremity arterial Doppler studies.  Dr. Myra GianottiBrabham will then see him in the office for consultation with the intent to perform right common femoral endarterectomy with orbital atherectomy of the right common and external iliac artery intraoperatively  Patient Profile     67 y.o. male with hx of HTN, HLD and ongoing tobacco smoking presented for outpatient PV angiogram for right greater than left lower extremity lifestyle limiting claudication.  Assessment & Plan    1. PVD - s/p successful diamondback orbital rotational atherectomy of the highly calcified high-grade segmentally occluded left common and external iliac artery with covered stenting using a VBX covered stent of the proximal left common iliac and nitinol self-expanding stent of the left external iliac artery post dilating with a 9 mm balloon.  - Plan for outpatient R sided intervention by Dr. Myra GianottiBrabham. - Continue ASA, Plavix and Lipitor.   2. HLD - Continue high intensity statin.   3. HTN - BP stable on current medications.  For questions or updates, please contact [facility] Please consult [email or web address] for contact info under Cardiology/STEMI.       Signed, Manson PasseyBhavinkumar Shi Grose, PA  12/05/2017, 8:02 AM

## 2017-12-05 NOTE — Discharge Summary (Addendum)
Discharge Summary    Patient ID: Nathan Palmer,  MRN: [id number], DOB/AGE: [birth year] 67 y.o.  Admit date: 12/04/2017 Discharge date: 12/05/2017  Primary Care Provider: [facility] Primary Cardiologist: Nanetta Batty, MD  Discharge Diagnoses    Active Problems:   Claudication in peripheral vascular disease (HCC)   HTN   HLD  Tobacco smoking  Allergies No Known Allergies  Diagnostic Studies/Procedures    PV Angiogram/Intervention Procedures Performed: 1. Abdominal aortogram/bilateral iliac angiogram/bifemoral runoff 2. Diamondback orbital rotational atherectomy left common iliac and external iliac artery 3. VBX covered stenting left common iliac artery 4. Epic nitinol self-expanding stenting left external iliac artery.  PROCEDURE DESCRIPTION:  The patient was brought to the second floor [facility] lab in the the postabsorptive state. Hewas premedicated with IV Versed and fentanyl. Hisleft groin was prepped and shaved in usual sterile fashion. Xylocaine 1% was used for local anesthesia. A 5 French sheath was inserted into the left common femoral artery using standard Seldinger technique. A 5 French pigtail catheter was placed in the distal abdominal aorta. Distal abdominal aortography, bilateral iliac angiography with bifemoral runoff was performed using bolus chase, digital subtraction and step table technique. Omnipaque dye was used for the entirety of the case. Retrograde aortic pressure was monitored during the case.   Angiographic Data:   1: Abdominal aorta- fluoroscopically calcified but not aneurysmal or obstructive 2: Left lower extremity- calcified 80% diffuse segmental proximal and mid left common iliac artery stenosis, 90% calcified proximal left external iliac artery stenosis with three-vessel runoff 3: Right lower extremity- diffuse 90% calcified right common and  external iliac artery, occluded right common femoral and proximal right SFA with a calcified plaque, 90% focal mid right SFA and 95% calcified popliteal artery in the P2 segment with three-vessel runoff  IMPRESSION:Nathan Palmer has highly calcified iliac vessels bilaterally with an occluded right common femoral and proximal right SFA. He has a high-grade focal calcified mid right SFA and popliteal artery with three-vessel runoff bilaterally. He complains of right greater than left lower extremity claudication. I reviewed the angiographic with Dr. Myra Gianotti who agrees that he will need surgical revascularization of his right common femoral artery with endarterectomy patch angioplasty and most likely orbital atherectomy of his right common and external iliac artery. I will performed orbital atherectomy of the left common and external iliac artery with covered stenting of his common and nitinol self-expanding stenting of his left external iliac artery preserving his hypogastric artery.  Procedure Description: Patient received a total of 9500 units of heparin with an ACT of 285. Total contrast measured the patient was 200 cc. He did receive 300 mg of p.o. Plavix at the end of the case. I placed an 8 French bright tip sheath in his mid left external iliac artery. I put a Viper wire in the infra renal abdominal aorta and performed orbital atherectomy with a 2 mm solid bur up to 120,000 RPMs performing multiple passes. I then used a 10 mm x 29 mm VBX covered stent to stent the proximal left common iliac artery. I overlapped a 10 mm x 40 mm [employer] epic nitinol self-expanding stent in the distal left common and proximal left external iliac artery and postdilated with a 9 mm x 4 cm balloon resulting in reduction of 80% left common and 90% left external iliac artery stenosis to 0% residual. Patient tolerated the procedure well. A distal abdominal aortogram was performed at the end of the case using  the pigtail catheter. The bright tip sheath with then exchanged over the 035 wire for a short 8 French sheath which was secured in place.  Final Impression:Successful diamondback orbital rotational atherectomy of the highly calcified high-grade segmentally occluded left common and external iliac artery with covered stenting using a VBX covered stent of the proximal left common iliac and nitinol self-expanding stent of the left external iliac artery post dilating with a 9 mm balloon. The sheath will be removed once ACT falls below 170 and pressure held. Patient will be discharged home in the morning and will see me back in the office next week. He will get lower extremity arterial Doppler studies. Dr. Myra Gianotti will then see him in the office for consultation with the intent to perform right common femoral endarterectomy with orbital atherectomy of the right common and external iliac artery intraoperatively   History of Present Illness     67 y.o. male with hx of HTN, HLD and ongoing tobacco smoking presented for outpatient PV angiogram for right greater than left lower extremity lifestyle limiting claudication.  Hx of greater than 50 pack years of tobacco abuse, currently smoking 1 pack/day.  There is no family history of heart disease.  Never had a heart attack or stroke and denies chest pain or shortness of breath.  He has complained of right greater than left lower extremity lifestyle limiting claudication less than 1 block for the last year and was referred to Dr. Allyson Sabal for further evaluation.  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  His left leg without significant disease.  The plan is to perform angiography and potential endovascular therapy.   Hospital Course     Consultants: None  1. PVD - s/p successful diamondback orbital rotational atherectomy of the highly calcified high-grade segmentally occluded left common and external iliac artery with covered  stenting using a VBX covered stent of the proximal left common iliac and nitinol self-expanding stent of the left external iliac artery post dilating with a 9 mm balloon. outpatient study post intervention followed by office visit with Dr. Allyson Sabal.  - Plan for outpatient R sided intervention by Dr. Myra Gianotti. - Continue ASA, Plavix and Lipitor.   2. HLD - Continue high intensity statin.   3. HTN - BP stable on current medications.  Discharge Vitals Blood pressure 124/83, pulse 78, temperature 98.6 F (37 C), temperature source Oral, resp. rate 16, height 5' 7.5" (1.715 m), weight 56.7 kg, SpO2 96 %.  Filed Weights   12/04/17 0843 12/05/17 0616  Weight: 58.5 kg 56.7 kg    Labs & Radiologic Studies    CBC Recent Labs    12/03/17 1143 12/05/17 0232  WBC 5.0 5.5  HGB 14.1 11.7*  HCT 40.6 34.1*  MCV 95 97.2  PLT 189 143*   Basic Metabolic Panel Recent Labs    16/10/96 1143 12/05/17 0232  NA 139 138  K 5.3* 3.5  CL 105 106  CO2 25 24  GLUCOSE 90 91  BUN 17 11  CREATININE 1.06 0.86  CALCIUM 9.8 8.0*   _____________  Dg Chest 2 View  Result Date: 12/03/2017 CLINICAL DATA:  Preop stent placement. EXAM: CHEST - 2 VIEW COMPARISON:  None. FINDINGS: The heart size and mediastinal contours are within normal limits. Both lungs are clear. The visualized skeletal structures are unremarkable. IMPRESSION: No active cardiopulmonary disease. Electronically Signed   By: Elige Ko   On: 12/03/2017 14:19   Disposition   Pt is being  discharged home today in good condition.  Follow-up Plans & Appointments    Follow-up Information    [facility]. Go on 12/12/2017.   Specialty:  Cardiology Why:  @9am  for LE arterial doppler Contact information: [address] [place] [id number] [phone]       Runell GessBerry, Jonathan J, MD. Go on 12/24/2017.   Specialties:  Cardiology, Radiology Why:  @ 9am for post hospital follow up  Contact  information: [address] [place] [id number] [phone]        Nada LibmanBrabham, Vance W, MD. Go on 01/05/2018.   Specialties:  Vascular Surgery, Cardiology Why:  @8 :30am for vascular evaluation Contact information: [address] [place] [address] [phone]          Discharge Instructions    Diet - low sodium heart healthy   Complete by:  As directed    Discharge instructions   Complete by:  As directed    No driving for 72 hours. No lifting over 5 lbs for 1 week. No sexual activity for 1 week.  Keep procedure site clean & dry. If you notice increased pain, swelling, bleeding or pus, call/return!  You may shower, but no soaking baths/hot tubs/pools for 1 week.   Increase activity slowly   Complete by:  As directed       Discharge Medications   Allergies as of 12/05/2017   No Known Allergies     Medication List    TAKE these medications   acetaminophen 500 MG tablet Commonly known as:  TYLENOL Take 1,000 mg by mouth 3 (three) times daily as needed for mild pain.   aspirin 81 MG tablet Take 81 mg by mouth daily.   atorvastatin 80 MG tablet Commonly known as:  LIPITOR Take 1 tablet (80 mg total) by mouth daily at 6 PM.   clopidogrel 75 MG tablet Commonly known as:  PLAVIX Take 1 tablet (75 mg total) by mouth daily with breakfast.   gabapentin 300 MG capsule Commonly known as:  NEURONTIN Take 300 mg by mouth 2 (two) times daily.   hydrochlorothiazide 25 MG tablet Commonly known as:  HYDRODIURIL Take 25 mg by mouth daily.   latanoprost 0.005 % ophthalmic solution Commonly known as:  XALATAN Place 1 drop into both eyes at bedtime.   OVER THE COUNTER MEDICATION Apply 1 application topically daily as needed (rash). AMISH CREAM        Acute coronary syndrome (MI, NSTEMI, STEMI, etc) this admission?: No.    Outstanding Labs/Studies   LE arterial doppler   Duration of Discharge Encounter   Greater than 30 minutes including physician  time.  Lorelei PontSigned, Chantal Worthey, PA 12/05/2017, 10:56 AM

## 2017-12-12 ENCOUNTER — Ambulatory Visit (HOSPITAL_BASED_OUTPATIENT_CLINIC_OR_DEPARTMENT_OTHER)
Admission: RE | Admit: 2017-12-12 | Discharge: 2017-12-12 | Disposition: A | Discharge status: Home / Self Care | Payer: No Typology Code available for payment source | Source: Ambulatory Visit | Attending: Cardiovascular Disease | Admitting: Cardiovascular Disease

## 2017-12-12 ENCOUNTER — Ambulatory Visit (HOSPITAL_COMMUNITY)
Admission: RE | Admit: 2017-12-12 | Discharge: 2017-12-12 | Disposition: A | Discharge status: Home / Self Care | Payer: No Typology Code available for payment source | Source: Ambulatory Visit | Attending: Cardiovascular Disease | Admitting: Cardiovascular Disease

## 2017-12-12 DIAGNOSIS — I739 Peripheral vascular disease, unspecified: Secondary | ICD-10-CM

## 2017-12-24 ENCOUNTER — Ambulatory Visit (INDEPENDENT_AMBULATORY_CARE_PROVIDER_SITE_OTHER): Payer: No Typology Code available for payment source | Admitting: Cardiovascular Disease

## 2017-12-24 ENCOUNTER — Encounter: Payer: Self-pay | Admitting: Cardiovascular Disease

## 2017-12-24 VITALS — BP 111/75 | HR 91 | Ht 69.0 in | Wt 131.4 lb

## 2017-12-24 DIAGNOSIS — E782 Mixed hyperlipidemia: Secondary | ICD-10-CM | POA: Diagnosis not present

## 2017-12-24 DIAGNOSIS — Z01818 Encounter for other preprocedural examination: Secondary | ICD-10-CM

## 2017-12-24 NOTE — Assessment & Plan Note (Signed)
History of essential hypertension blood pressure measured at 111/75.  He is on hydrochlorothiazide.

## 2017-12-24 NOTE — Progress Notes (Signed)
12/24/2017 Nathan Palmer   Dec 27, 1950  [id number]  Primary Physician [facility] Primary Cardiologist: Runell Gess MD [name]  HPI:  Nathan Palmer is a 67 y.o.  thin appearing divorced African-American male father of 3, grandfather one grandchild referred by the [facility] for evaluation of claudication.    I last saw him in the office 11/14/2017.  He has a history of treated hypertension, untreated mild hyperlipidemia and greater than 50 pack years of tobacco abuse currently smoking 1 pack/day.  There is no family history of heart disease.  Never had a heart attack or stroke and denies chest pain or shortness of breath.  He has complained of right greater than left lower extremity lifestyle limiting claudication less than 1 block for the last year and was referred here for further evaluation of this. I performed peripheral angiography 12/04/2017 revealing bilateral iliac disease as well as right common femoral SFA and popliteal disease.  I performed Dynabac orbital rotational atherectomy, PTA and stenting of his left common and external iliac artery.  His ABIs did not change but his velocities improved.  His claudication on the left has markedly improved as well.  He has an appointment to see Dr. Myra Gianotti on 01/05/2018 to discuss surgical recatheterization of his right lower extremity.  Current Meds  Medication Sig  . aspirin 81 MG tablet Take 81 mg by mouth daily.  Marland Kitchen atorvastatin (LIPITOR) 80 MG tablet Take 1 tablet (80 mg total) by mouth daily at 6 PM.  . clopidogrel (PLAVIX) 75 MG tablet Take 1 tablet (75 mg total) by mouth daily with breakfast.  . gabapentin (NEURONTIN) 300 MG capsule Take 300 mg by mouth 2 (two) times daily.  . hydrochlorothiazide (HYDRODIURIL) 25 MG tablet Take 25 mg by mouth daily.  Marland Kitchen latanoprost (XALATAN) 0.005 % ophthalmic solution Place 1 drop into both eyes at bedtime.  Marland Kitchen OVER THE COUNTER MEDICATION Apply 1  application topically daily as needed (rash). AMISH CREAM  . traMADol (ULTRAM) 50 MG tablet Take by mouth every 3 (three) hours as needed.  . [DISCONTINUED] acetaminophen (TYLENOL) 500 MG tablet Take 1,000 mg by mouth 3 (three) times daily as needed for mild pain.      No Known Allergies  Social History   Socioeconomic History  . Marital status: Single    Spouse name: Not on file  . Number of children: Not on file  . Years of education: Not on file  . Highest education level: Not on file  Occupational History  . Not on file  Social Needs  . Financial resource strain: Not on file  . Food insecurity:    Worry: Not on file    Inability: Not on file  . Transportation needs:    Medical: Not on file    Non-medical: Not on file  Tobacco Use  . Smoking status: Former Smoker    Packs/day: 1.00    Types: Cigarettes    Last attempt to quit: 12/04/2017    Years since quitting: 0.0  . Smokeless tobacco: Never Used  Substance and Sexual Activity  . Alcohol use: Yes    Comment: beer daily " I plan on quitting "  . Drug use: Never  . Sexual activity: Not on file  Lifestyle  . Physical activity:    Days per week: Not on file    Minutes per session: Not on file  . Stress: Not on file  Relationships  .  Social connections:    Talks on phone: Not on file    Gets together: Not on file    Attends religious service: Not on file    Active member of club or organization: Not on file    Attends meetings of clubs or organizations: Not on file    Relationship status: Not on file  . Intimate partner violence:    Fear of current or ex partner: Not on file    Emotionally abused: Not on file    Physically abused: Not on file    Forced sexual activity: Not on file  Other Topics Concern  . Not on file  Social History Narrative  . Not on file     Review of Systems: General: negative for chills, fever, night sweats or weight changes.  Cardiovascular: negative for chest pain, dyspnea on  exertion, edema, orthopnea, palpitations, paroxysmal nocturnal dyspnea or shortness of breath Dermatological: negative for rash Respiratory: negative for cough or wheezing Urologic: negative for hematuria Abdominal: negative for nausea, vomiting, diarrhea, bright red blood per rectum, melena, or hematemesis Neurologic: negative for visual changes, syncope, or dizziness All other systems reviewed and are otherwise negative except as noted above.    Blood pressure 111/75, pulse 91, height 5\' 9"  (1.753 m), weight 131 lb 6.4 oz (59.6 kg).  General appearance: alert and no distress Neck: no adenopathy, no carotid bruit, no JVD, supple, symmetrical, trachea midline and thyroid not enlarged, symmetric, no tenderness/mass/nodules Lungs: clear to auscultation bilaterally Heart: regular rate and rhythm, S1, S2 normal, no murmur, click, rub or gallop Extremities: extremities normal, atraumatic, no cyanosis or edema Pulses: Absent right pedal pulse Skin: Skin color, texture, turgor normal. No rashes or lesions Neurologic: Alert and oriented X 3, normal strength and tone. Normal symmetric reflexes. Normal coordination and gait  EKG not performed today  ASSESSMENT AND PLAN:   Claudication in peripheral vascular disease [facility]) [name] returns today after his recent lower extremity revascularization procedure which I performed on 12/04/2017.  I performed Dynabac orbital rotational atherectomy, PTA and stenting of a highly calcified left common and external iliac artery.  He has three-vessel runoff on that side.  He did have a long diffuse calcific right common and external iliac artery stenosis with an occluded right common femoral and proximal right SFA.  He had 90% mid right SFA and 90% calcified popliteal artery stenosis on that side.  He has an appointment to see Dr. Myra Gianotti for consideration of surgical revascularization on 01/05/2018.  Because of his risk factors and the diffuse nature of his PAD, I  am going to get a 2D echo and a formic logic Myoview stress test to risk stratify him.  Essential hypertension History of essential hypertension blood pressure measured at 111/75.  He is on hydrochlorothiazide.  Hyperlipidemia History of hyperlipidemia on statin therapy.  We will check a lipid and liver profile  Tobacco abuse History of ongoing tobacco abuse down from 1 pack a day to 5 cigarettes a day with a desire to stop.  I counseled him on the importance of smoking cessation.      Runell Gess MD FACP,FACC,FAHA, [facility] 12/24/2017 9:05 AM

## 2017-12-24 NOTE — Assessment & Plan Note (Signed)
History of ongoing tobacco abuse down from 1 pack a day to 5 cigarettes a day with a desire to stop.  I counseled him on the importance of smoking cessation.

## 2017-12-24 NOTE — Assessment & Plan Note (Signed)
Nathan Palmer returns today after his recent lower extremity revascularization procedure which I performed on 12/04/2017.  I performed Dynabac orbital rotational atherectomy, PTA and stenting of a highly calcified left common and external iliac artery.  He has three-vessel runoff on that side.  He did have a long diffuse calcific right common and external iliac artery stenosis with an occluded right common femoral and proximal right SFA.  He had 90% mid right SFA and 90% calcified popliteal artery stenosis on that side.  He has an appointment to see Dr. Myra Gianotti for consideration of surgical revascularization on 01/05/2018.  Because of his risk factors and the diffuse nature of his PAD, I am going to get a 2D echo and a formic logic Myoview stress test to risk stratify him.

## 2017-12-24 NOTE — Assessment & Plan Note (Signed)
History of hyperlipidemia on statin therapy. We will check a lipid and liver profile 

## 2017-12-24 NOTE — Patient Instructions (Signed)
Medication Instructions:  Your physician recommends that you continue on your current medications as directed. Please refer to the Current Medication list given to you today.   Labwork: Your physician recommends that you return for lab work in: FASTING Labs   Testing/Procedures: Your physician has requested that you have an echocardiogram. Echocardiography is a painless test that uses sound waves to create images of your heart. It provides your doctor with information about the size and shape of your heart and how well your heart's chambers and valves are working. This procedure takes approximately one hour. There are no restrictions for this procedure.  Your physician has requested that you have a lexiscan myoview. For further information please visit [email or web address] Please follow instruction sheet, as given.    Follow-Up: Your physician recommends that you schedule a follow-up appointment in: 3 months with Dr. Allyson Sabal   Any Other Special Instructions Will Be Listed Below (If Applicable).     If you need a refill on your cardiac medications before your next appointment, please call your pharmacy.

## 2017-12-29 ENCOUNTER — Ambulatory Visit (HOSPITAL_COMMUNITY): Payer: No Typology Code available for payment source | Attending: Cardiology

## 2017-12-29 ENCOUNTER — Other Ambulatory Visit: Payer: Non-veteran care | Admitting: *Deleted

## 2017-12-29 ENCOUNTER — Other Ambulatory Visit: Payer: Self-pay

## 2017-12-29 DIAGNOSIS — Z01818 Encounter for other preprocedural examination: Secondary | ICD-10-CM | POA: Diagnosis present

## 2017-12-29 DIAGNOSIS — I119 Hypertensive heart disease without heart failure: Secondary | ICD-10-CM | POA: Diagnosis not present

## 2017-12-29 DIAGNOSIS — Z720 Tobacco use: Secondary | ICD-10-CM | POA: Diagnosis not present

## 2017-12-29 DIAGNOSIS — E785 Hyperlipidemia, unspecified: Secondary | ICD-10-CM | POA: Insufficient documentation

## 2017-12-29 DIAGNOSIS — I059 Rheumatic mitral valve disease, unspecified: Secondary | ICD-10-CM | POA: Diagnosis not present

## 2017-12-29 DIAGNOSIS — I739 Peripheral vascular disease, unspecified: Secondary | ICD-10-CM | POA: Insufficient documentation

## 2017-12-29 LAB — LIPID PANEL
Chol/HDL Ratio: 2.4 ratio (ref 0.0–5.0)
Cholesterol, Total: 111 mg/dL (ref 100–199)
HDL: 46 mg/dL (ref >39)
LDL Calculated: 18 mg/dL (ref 0–99)
Triglycerides: 236 mg/dL — ABNORMAL HIGH (ref 0–149)
VLDL Cholesterol Cal: 47 mg/dL — ABNORMAL HIGH (ref 5–40)

## 2017-12-29 LAB — HEPATIC FUNCTION PANEL
ALBUMIN: 4.5 g/dL (ref 3.6–4.8)
ALT: 24 IU/L (ref 0–44)
AST: 41 IU/L — AB (ref 0–40)
Alkaline Phosphatase: 82 IU/L (ref 39–117)
BILIRUBIN TOTAL: 0.6 mg/dL (ref 0.0–1.2)
Bilirubin, Direct: 0.22 mg/dL (ref 0.00–0.40)
TOTAL PROTEIN: 7.4 g/dL (ref 6.0–8.5)

## 2017-12-30 ENCOUNTER — Telehealth (HOSPITAL_COMMUNITY): Payer: Self-pay

## 2017-12-30 NOTE — Telephone Encounter (Signed)
Encounter complete. 

## 2018-01-01 ENCOUNTER — Ambulatory Visit (HOSPITAL_COMMUNITY)
Admission: RE | Admit: 2018-01-01 | Discharge: 2018-01-01 | Disposition: A | Discharge status: Home / Self Care | Payer: No Typology Code available for payment source | Source: Ambulatory Visit | Attending: Cardiovascular Disease | Admitting: Cardiovascular Disease

## 2018-01-01 DIAGNOSIS — Z01818 Encounter for other preprocedural examination: Secondary | ICD-10-CM | POA: Insufficient documentation

## 2018-01-01 LAB — MYOCARDIAL PERFUSION IMAGING
CHL CUP NUCLEAR SDS: 0
CHL CUP RESTING HR STRESS: 74 {beats}/min
LV dias vol: 88 mL (ref 62–150)
LVSYSVOL: 44 mL
NUC STRESS TID: 1.06
Peak HR: 105 {beats}/min
SRS: 2
SSS: 2

## 2018-01-01 MED ORDER — TECHNETIUM TC 99M TETROFOSMIN IV KIT
10.2000 | PACK | Freq: Once | INTRAVENOUS | Status: AC | PRN
  Administered 2018-01-01: 10.2 via INTRAVENOUS
  Filled 2018-01-01: qty 11

## 2018-01-01 MED ORDER — TECHNETIUM TC 99M TETROFOSMIN IV KIT
31.0000 | PACK | Freq: Once | INTRAVENOUS | Status: AC | PRN
  Administered 2018-01-01: 31 via INTRAVENOUS
  Filled 2018-01-01: qty 31

## 2018-01-01 MED ORDER — REGADENOSON 0.4 MG/5ML IV SOLN
0.4000 mg | Freq: Once | INTRAVENOUS | Status: AC
  Administered 2018-01-01: 0.4 mg via INTRAVENOUS

## 2018-01-05 ENCOUNTER — Other Ambulatory Visit: Payer: Self-pay

## 2018-01-05 ENCOUNTER — Encounter: Payer: Self-pay | Admitting: Surgery

## 2018-01-05 ENCOUNTER — Ambulatory Visit (INDEPENDENT_AMBULATORY_CARE_PROVIDER_SITE_OTHER): Payer: No Typology Code available for payment source | Admitting: Surgery

## 2018-01-05 ENCOUNTER — Other Ambulatory Visit: Payer: Self-pay | Admitting: *Deleted

## 2018-01-05 ENCOUNTER — Ambulatory Visit (INDEPENDENT_AMBULATORY_CARE_PROVIDER_SITE_OTHER)
Admission: RE | Admit: 2018-01-05 | Discharge: 2018-01-05 | Disposition: A | Discharge status: Home / Self Care | Payer: No Typology Code available for payment source | Source: Ambulatory Visit | Attending: Surgery | Admitting: Surgery

## 2018-01-05 ENCOUNTER — Encounter: Payer: Self-pay | Admitting: *Deleted

## 2018-01-05 ENCOUNTER — Other Ambulatory Visit: Payer: Self-pay | Admitting: Surgery

## 2018-01-05 VITALS — BP 111/80 | HR 86 | Temp 98.4°F | Resp 19 | Ht 69.0 in | Wt 125.3 lb

## 2018-01-05 DIAGNOSIS — I70213 Atherosclerosis of native arteries of extremities with intermittent claudication, bilateral legs: Secondary | ICD-10-CM | POA: Diagnosis not present

## 2018-01-05 DIAGNOSIS — I6523 Occlusion and stenosis of bilateral carotid arteries: Secondary | ICD-10-CM

## 2018-01-05 DIAGNOSIS — Z01818 Encounter for other preprocedural examination: Secondary | ICD-10-CM

## 2018-01-05 DIAGNOSIS — I739 Peripheral vascular disease, unspecified: Secondary | ICD-10-CM

## 2018-01-05 NOTE — H&P (View-Only) (Signed)
Vascular and Vein Specialist of Nathan Palmer  Patient name: Nathan Palmer [place] MRN: [id number] DOB: 12-30-1950 Sex: male   REQUESTING PROVIDER:    Dr Allyson SabalBerry   REASON FOR CONSULT:    claudication  HISTORY OF PRESENT ILLNESS:   Nathan Palmer is a 67 y.o. male, who is referred for evaluation for evaluation of right leg claudication.  The patient has previously undergone intervention on the left common and external iliac artery which gave him symptomatic improvement on the left.  On the right he has iliac disease as well as femoral-popliteal disease.  He is here today to discuss surgical intervention.  He states he can walk less than half a block before his right leg gives out.  He does not have rest pain.  He does not have any nonhealing wounds.  The patient suffers from hypertension which has been medically managed.  He takes a statin for hypercholesterolemia.  He is a 50-pack-year smoker, currently smoking 1 pack a day.  PAST MEDICAL HISTORY    Past Medical History:  Diagnosis Date  . Hyperlipidemia   . Hypertension   . Peripheral vascular disease (HCC)      FAMILY HISTORY   History reviewed. No pertinent family history.  SOCIAL HISTORY:   Social History   Socioeconomic History  . Marital status: Single    Spouse name: Not on file  . Number of children: Not on file  . Years of education: Not on file  . Highest education level: Not on file  Occupational History  . Not on file  Social Needs  . Financial resource strain: Not on file  . Food insecurity:    Worry: Not on file    Inability: Not on file  . Transportation needs:    Medical: Not on file    Non-medical: Not on file  Tobacco Use  . Smoking status: Former Smoker    Packs/day: 1.00    Types: Cigarettes    Last attempt to quit: 12/04/2017    Years since quitting: 0.0  . Smokeless tobacco: Never Used  Substance and Sexual Activity  . Alcohol use: Yes    Comment: beer  daily " I plan on quitting "  . Drug use: Never  . Sexual activity: Not on file  Lifestyle  . Physical activity:    Days per week: Not on file    Minutes per session: Not on file  . Stress: Not on file  Relationships  . Social connections:    Talks on phone: Not on file    Gets together: Not on file    Attends religious service: Not on file    Active member of club or organization: Not on file    Attends meetings of clubs or organizations: Not on file    Relationship status: Not on file  . Intimate partner violence:    Fear of current or ex partner: Not on file    Emotionally abused: Not on file    Physically abused: Not on file    Forced sexual activity: Not on file  Other Topics Concern  . Not on file  Social History Narrative  . Not on file    ALLERGIES:    No Known Allergies  CURRENT MEDICATIONS:    Current Outpatient Medications  Medication Sig Dispense Refill  . aspirin 81 MG tablet Take 81 mg by mouth daily.    Marland Kitchen. atorvastatin (LIPITOR) 80 MG tablet Take 1 tablet (80 mg total) by mouth daily at 6  PM. 90 tablet 3  . clopidogrel (PLAVIX) 75 MG tablet Take 1 tablet (75 mg total) by mouth daily with breakfast. 90 tablet 3  . gabapentin (NEURONTIN) 300 MG capsule Take 300 mg by mouth 2 (two) times daily.    . hydrochlorothiazide (HYDRODIURIL) 25 MG tablet Take 25 mg by mouth daily.    Marland Kitchen latanoprost (XALATAN) 0.005 % ophthalmic solution Place 1 drop into both eyes at bedtime.    Marland Kitchen OVER THE COUNTER MEDICATION Apply 1 application topically daily as needed (rash). AMISH CREAM    . traMADol (ULTRAM) 50 MG tablet Take by mouth every 3 (three) hours as needed.     No current facility-administered medications for this visit.     REVIEW OF SYSTEMS:   [X]  denotes positive finding, [ ]  denotes negative finding Cardiac  Comments:  Chest pain or chest pressure:    Shortness of breath upon exertion:    Short of breath when lying flat:    Irregular heart rhythm:          Vascular    Pain in calf, thigh, or hip brought on by ambulation: x   Pain in feet at night that wakes you up from your sleep:  x   Blood clot in your veins:    Leg swelling:         Pulmonary    Oxygen at home:    Productive cough:     Wheezing:         Neurologic    Sudden weakness in arms or legs:     Sudden numbness in arms or legs:     Sudden onset of difficulty speaking or slurred speech:    Temporary loss of vision in one eye:     Problems with dizziness:  x       Gastrointestinal    Blood in stool:  x    Vomited blood:         Genitourinary    Burning when urinating:     Blood in urine:        Psychiatric    Major depression:         Hematologic    Bleeding problems:    Problems with blood clotting too easily:        Skin    Rashes or ulcers:        Constitutional    Fever or chills:     PHYSICAL EXAM:   Vitals:   01/05/18 0826  BP: 111/80  Pulse: 86  Resp: 19  Temp: 98.4 F (36.9 C)  TempSrc: Oral  SpO2: 98%  Weight: 125 lb 4.8 oz (56.8 kg)  Height: 5\' 9"  (1.753 m)    GENERAL: The patient is a well-nourished male, in no acute distress. The vital signs are documented above. CARDIAC: There is a regular rate and rhythm.  VASCULAR: Palpable left femoral pulse, nonpalpable right.  Pedal pulses are not palpable. PULMONARY: Nonlabored respirations ABDOMEN: Soft and non-tender with normal pitched bowel sounds.  MUSCULOSKELETAL: There are no major deformities or cyanosis. NEUROLOGIC: No focal weakness or paresthesias are detected. SKIN: There are no ulcers or rashes noted. PSYCHIATRIC: The patient has a normal affect.  STUDIES:   I have reviewed his arteriogram which shows heavily calcified right iliac system, occluded right common femoral and proximal superficial femoral artery and high-grade stenosis of the popliteal artery at the level of the knee  Carotid:  1-39% bilateral  ASSESSMENT and PLAN   Severe right leg claudication:  We discussed  proceeding with right femoral endarterectomy and atherectomy with stenting of his right iliac artery.  He wants to get this done as soon as possible.  I am placing him on the schedule for Wednesday.  I will stop his Plavix today.  We discussed the risk of recurrence, bleeding, infection.  All of his questions were answered.  I am getting a carotid duplex for preoperative evaluation.  We spoke about smoking cessation and its importance is now 5 weight 7 get his vitals to   Durene Cal, MD [facility] [phone] Pager [phone]

## 2018-01-05 NOTE — Progress Notes (Signed)
V[facility] and Vein Specialist of [name] name: Nathan Palmer MRN: [id number] DOB: 12-30-1950 Sex: male   REQUESTING PROVIDER:    Dr Allyson SabalBerry   REASON FOR CONSULT:    claudication  HISTORY OF PRESENT ILLNESS:   Nathan Palmer is a 67 y.o. male, who is referred for evaluation for evaluation of right leg claudication.  The patient has previously undergone intervention on the left common and external iliac artery which gave him symptomatic improvement on the left.  On the right he has iliac disease as well as femoral-popliteal disease.  He is here today to discuss surgical intervention.  He states he can walk less than half a block before his right leg gives out.  He does not have rest pain.  He does not have any nonhealing wounds.  The patient suffers from hypertension which has been medically managed.  He takes a statin for hypercholesterolemia.  He is a 50-pack-year smoker, currently smoking 1 pack a day.  PAST MEDICAL HISTORY    Past Medical History:  Diagnosis Date  . Hyperlipidemia   . Hypertension   . Peripheral vascular disease (HCC)      FAMILY HISTORY   History reviewed. No pertinent family history.  SOCIAL HISTORY:   Social History   Socioeconomic History  . Marital status: Single    Spouse name: Not on file  . Number of children: Not on file  . Years of education: Not on file  . Highest education level: Not on file  Occupational History  . Not on file  Social Needs  . Financial resource strain: Not on file  . Food insecurity:    Worry: Not on file    Inability: Not on file  . Transportation needs:    Medical: Not on file    Non-medical: Not on file  Tobacco Use  . Smoking status: Former Smoker    Packs/day: 1.00    Types: Cigarettes    Last attempt to quit: 12/04/2017    Years since quitting: 0.0  . Smokeless tobacco: Never Used  Substance and Sexual Activity  . Alcohol use: Yes    Comment: beer  daily " I plan on quitting "  . Drug use: Never  . Sexual activity: Not on file  Lifestyle  . Physical activity:    Days per week: Not on file    Minutes per session: Not on file  . Stress: Not on file  Relationships  . Social connections:    Talks on phone: Not on file    Gets together: Not on file    Attends religious service: Not on file    Active member of club or organization: Not on file    Attends meetings of clubs or organizations: Not on file    Relationship status: Not on file  . Intimate partner violence:    Fear of current or ex partner: Not on file    Emotionally abused: Not on file    Physically abused: Not on file    Forced sexual activity: Not on file  Other Topics Concern  . Not on file  Social History Narrative  . Not on file    ALLERGIES:    No Known Allergies  CURRENT MEDICATIONS:    Current Outpatient Medications  Medication Sig Dispense Refill  . aspirin 81 MG tablet Take 81 mg by mouth daily.    Marland Kitchen. atorvastatin (LIPITOR) 80 MG tablet Take 1 tablet (80 mg total) by mouth daily at 6  PM. 90 tablet 3  . clopidogrel (PLAVIX) 75 MG tablet Take 1 tablet (75 mg total) by mouth daily with breakfast. 90 tablet 3  . gabapentin (NEURONTIN) 300 MG capsule Take 300 mg by mouth 2 (two) times daily.    . hydrochlorothiazide (HYDRODIURIL) 25 MG tablet Take 25 mg by mouth daily.    Marland Kitchen latanoprost (XALATAN) 0.005 % ophthalmic solution Place 1 drop into both eyes at bedtime.    Marland Kitchen OVER THE COUNTER MEDICATION Apply 1 application topically daily as needed (rash). AMISH CREAM    . traMADol (ULTRAM) 50 MG tablet Take by mouth every 3 (three) hours as needed.     No current facility-administered medications for this visit.     REVIEW OF SYSTEMS:   [X]  denotes positive finding, [ ]  denotes negative finding Cardiac  Comments:  Chest pain or chest pressure:    Shortness of breath upon exertion:    Short of breath when lying flat:    Irregular heart rhythm:          Vascular    Pain in calf, thigh, or hip brought on by ambulation: x   Pain in feet at night that wakes you up from your sleep:  x   Blood clot in your veins:    Leg swelling:         Pulmonary    Oxygen at home:    Productive cough:     Wheezing:         Neurologic    Sudden weakness in arms or legs:     Sudden numbness in arms or legs:     Sudden onset of difficulty speaking or slurred speech:    Temporary loss of vision in one eye:     Problems with dizziness:  x       Gastrointestinal    Blood in stool:  x    Vomited blood:         Genitourinary    Burning when urinating:     Blood in urine:        Psychiatric    Major depression:         Hematologic    Bleeding problems:    Problems with blood clotting too easily:        Skin    Rashes or ulcers:        Constitutional    Fever or chills:     PHYSICAL EXAM:   Vitals:   01/05/18 0826  BP: 111/80  Pulse: 86  Resp: 19  Temp: 98.4 F (36.9 C)  TempSrc: Oral  SpO2: 98%  Weight: 125 lb 4.8 oz (56.8 kg)  Height: 5\' 9"  (1.753 m)    GENERAL: The patient is a well-nourished male, in no acute distress. The vital signs are documented above. CARDIAC: There is a regular rate and rhythm.  VASCULAR: Palpable left femoral pulse, nonpalpable right.  Pedal pulses are not palpable. PULMONARY: Nonlabored respirations ABDOMEN: Soft and non-tender with normal pitched bowel sounds.  MUSCULOSKELETAL: There are no major deformities or cyanosis. NEUROLOGIC: No focal weakness or paresthesias are detected. SKIN: There are no ulcers or rashes noted. PSYCHIATRIC: The patient has a normal affect.  STUDIES:   I have reviewed his arteriogram which shows heavily calcified right iliac system, occluded right common femoral and proximal superficial femoral artery and high-grade stenosis of the popliteal artery at the level of the knee  Carotid:  1-39% bilateral  ASSESSMENT and PLAN   Severe right leg claudication:  We discussed  proceeding with right femoral endarterectomy and atherectomy with stenting of his right iliac artery.  He wants to get this done as soon as possible.  I am placing him on the schedule for Wednesday.  I will stop his Plavix today.  We discussed the risk of recurrence, bleeding, infection.  All of his questions were answered.  I am getting a carotid duplex for preoperative evaluation.  We spoke about smoking cessation and its importance is now 5 weight 7 get his vitals to   Durene Cal, MD [facility] of [facility] [phone] Pager [phone]

## 2018-01-06 ENCOUNTER — Encounter (HOSPITAL_COMMUNITY): Payer: Self-pay | Admitting: *Deleted

## 2018-01-06 ENCOUNTER — Other Ambulatory Visit: Payer: Self-pay

## 2018-01-06 ENCOUNTER — Other Ambulatory Visit: Payer: Self-pay | Admitting: *Deleted

## 2018-01-06 DIAGNOSIS — I739 Peripheral vascular disease, unspecified: Secondary | ICD-10-CM

## 2018-01-06 DIAGNOSIS — E782 Mixed hyperlipidemia: Secondary | ICD-10-CM

## 2018-01-06 NOTE — Progress Notes (Signed)
Anesthesia Chart Review: Maury Dus  Case:  [id number] Date/Time:  01/07/18 1115   Procedures:      ENDARTERECTOMY FEMORAL RIGHT (Right )     ILIAC ATHERECTOMY RIGHT (Right )     INSERTION OF ILIAC STENT RIGHT (Right )   Anesthesia type:  General   Pre-op diagnosis:  PERIPHERAL VASCULAR DISEASE WITH CLAUDICATION   Location:  [facility] ROOM 16 / [facility] OR   Surgeon:  Nada Libman, MD      DISCUSSION: Patient is a 67 year old male scheduled for the above procedure.   History includes PAD (atherectomy/stenting left CIA, left EIA 12/04/17; surgical intervention for RLE disease), smoking, HTN, HLD, glaucoma.   He had recent cardiac testing by cardiologist Dr. Allyson Sabal in anticipation for upcoming vascular surgery (see below).   According to [facility] notes, patient to hold Plavix x 5 days and continue ASA perioperatively. If no acute changes then I anticipate that he can proceed as planned.   PROVIDERS: Sherwood Gambler, MD is listed as PCP Nanetta Batty, MD is cardiologist   LABS: He is for updated labs prior to surgery. Last Cr 0.86, AST 41, ALT 24, H/H 11.7/34.1, PLT 143.     IMAGES:  CXR 12/03/17: IMPRESSION: No active cardiopulmonary disease.   EKG: 11/14/17: NSR   CV: Carotid U/S 01/05/18: Final Interpretation: Right Carotid: Velocities in the right ICA are consistent with a 1-39% stenosis. Left Carotid: Velocities in the left ICA are consistent with a 1-39% stenosis. Vertebrals: Bilateral vertebral arteries demonstrate antegrade flow. Subclavians: Normal flow hemodynamics were seen in bilateral subclavian arteries.  Nuclear stress test 01/01/18:  Nuclear stress EF: 50%. The left ventricular ejection fraction is mildly decreased (45-54%).  There was no ST segment deviation noted during stress.  The study is normal. There is no evidence of ischemia no evidence of previous infarction.  This is a low risk study.  Echo 12/29/17: Study Conclusions - Left ventricle: The  cavity size was normal. Systolic function was   mildly reduced. The estimated ejection fraction was in the range   of 45% to 50%. Mild hypokinesis of the anteroseptal myocardium. - Aortic valve: There was no regurgitation. - Mitral valve: Calcified annulus. There was trivial regurgitation. - Right ventricle: Systolic function was normal. - Atrial septum: No defect or patent foramen ovale was identified. - Tricuspid valve: There was trivial regurgitation. - Pulmonic valve: There was no significant regurgitation. Impressions: - Technically difficult study. There appears to be mild-moderate   hypokinesis of the anteroseptum, but the anterior wall cannot be   fully visualized to determine the extent of the wall motion   abnormality. EF mildly reduced. Recommend study such as   myocardial perfusion to determine if there is ischemia/infarct in   that region.  Abdominal aortogram/bilateral iliac angiogram/bifemoral runoff 12/04/17: 1: Abdominal aorta- fluoroscopically calcified but not aneurysmal or obstructive 2: Left lower extremity- calcified 80% diffuse segmental proximal and mid left common iliac artery stenosis, 90% calcified proximal left external iliac artery stenosis with three-vessel runoff 3: Right lower extremity- diffuse 90% calcified right common and external iliac artery, occluded right common femoral and proximal right SFA with a calcified plaque, 90% focal mid right SFA and 95% calcified popliteal artery in the P2 segment with three-vessel runoff 4. Diamondback orbital rotational atherectomy left common iliac and external iliac artery. VBX covered stenting left common iliac artery. Epic nitinol self-expanding stenting left external iliac artery.   Past Medical History:  Diagnosis Date  .  Arthritis   . Glaucoma   . Hyperlipidemia   . Hypertension   . Peripheral vascular disease [facility](HCC)     Past Surgical History:  Procedure Laterality Date  . ABDOMINAL AORTAGRAM Left  12/04/2017   ABDOMINAL AORTOGRAM W/LOWER EXTREMITY (N/A) as a surgical intervention   . ABDOMINAL AORTOGRAM W/LOWER EXTREMITY N/A 12/04/2017   Procedure: ABDOMINAL AORTOGRAM W/LOWER EXTREMITY;  Surgeon: Runell GessBerry, Jonathan J, MD;  Location: [facility] INVASIVE CV LAB;  Service: Cardiovascular;  Laterality: N/A;  . bone spurs Bilateral    feet  . COLONOSCOPY W/ POLYPECTOMY    . PERIPHERAL VASCULAR INTERVENTION Left 12/04/2017   Procedure: PERIPHERAL VASCULAR INTERVENTION;  Surgeon: Runell GessBerry, Jonathan J, MD;  [facility];  Service: Cardiovascular;  Laterality: Left;  left external and common    MEDICATIONS: No current facility-administered medications for this encounter.    Marland Kitchen. aspirin 81 MG tablet  . atorvastatin (LIPITOR) 80 MG tablet  . clopidogrel (PLAVIX) 75 MG tablet  . gabapentin (NEURONTIN) 300 MG capsule  . hydrochlorothiazide (HYDRODIURIL) 25 MG tablet  . latanoprost (XALATAN) 0.005 % ophthalmic solution  . Skin Protectants, Misc. (MINERIN) CREA  . traMADol (ULTRAM) 50 MG tablet    Velna Ochsllison Zelenak, PA-C [facility]esthesiology Phone [phone] 01/06/2018 4:58 PM

## 2018-01-07 ENCOUNTER — Encounter (HOSPITAL_COMMUNITY)
Admission: RE | Disposition: A | Discharge status: Home Health | Payer: Self-pay | Source: Home / Self Care | Attending: Surgery

## 2018-01-07 ENCOUNTER — Inpatient Hospital Stay (HOSPITAL_COMMUNITY): Payer: No Typology Code available for payment source | Admitting: Vascular Surgery

## 2018-01-07 ENCOUNTER — Inpatient Hospital Stay (HOSPITAL_COMMUNITY)
Admission: RE | Admit: 2018-01-07 | Discharge: 2018-01-09 | DRG: 272 | Disposition: A | Discharge status: Home Health | Payer: No Typology Code available for payment source | Attending: Surgery | Admitting: Surgery

## 2018-01-07 ENCOUNTER — Encounter (HOSPITAL_COMMUNITY): Payer: Self-pay

## 2018-01-07 DIAGNOSIS — Z716 Tobacco abuse counseling: Secondary | ICD-10-CM

## 2018-01-07 DIAGNOSIS — E785 Hyperlipidemia, unspecified: Secondary | ICD-10-CM | POA: Diagnosis present

## 2018-01-07 DIAGNOSIS — F1290 Cannabis use, unspecified, uncomplicated: Secondary | ICD-10-CM | POA: Diagnosis not present

## 2018-01-07 DIAGNOSIS — Z79899 Other long term (current) drug therapy: Secondary | ICD-10-CM | POA: Diagnosis not present

## 2018-01-07 DIAGNOSIS — F17210 Nicotine dependence, cigarettes, uncomplicated: Secondary | ICD-10-CM | POA: Diagnosis not present

## 2018-01-07 DIAGNOSIS — Z7982 Long term (current) use of aspirin: Secondary | ICD-10-CM

## 2018-01-07 DIAGNOSIS — I10 Essential (primary) hypertension: Secondary | ICD-10-CM | POA: Diagnosis present

## 2018-01-07 DIAGNOSIS — Z79891 Long term (current) use of opiate analgesic: Secondary | ICD-10-CM | POA: Diagnosis not present

## 2018-01-07 DIAGNOSIS — H409 Unspecified glaucoma: Secondary | ICD-10-CM | POA: Diagnosis present

## 2018-01-07 DIAGNOSIS — Z7902 Long term (current) use of antithrombotics/antiplatelets: Secondary | ICD-10-CM

## 2018-01-07 DIAGNOSIS — I70211 Atherosclerosis of native arteries of extremities with intermittent claudication, right leg: Principal | ICD-10-CM | POA: Diagnosis present

## 2018-01-07 DIAGNOSIS — I739 Peripheral vascular disease, unspecified: Secondary | ICD-10-CM | POA: Diagnosis present

## 2018-01-07 HISTORY — PX: ILIAC ATHERECTOMY: SHX6328

## 2018-01-07 HISTORY — PX: ENDARTERECTOMY FEMORAL: SHX5804

## 2018-01-07 HISTORY — PX: FEMORAL ENDARTERECTOMY: SUR606

## 2018-01-07 HISTORY — DX: Unspecified glaucoma: H40.9

## 2018-01-07 HISTORY — PX: INSERTION OF ILIAC STENT: SHX6256

## 2018-01-07 HISTORY — DX: Unspecified osteoarthritis, unspecified site: M19.90

## 2018-01-07 LAB — SURGICAL PCR SCREEN
MRSA, PCR: NEGATIVE
STAPHYLOCOCCUS AUREUS: NEGATIVE

## 2018-01-07 LAB — COMPREHENSIVE METABOLIC PANEL
ALT: 36 U/L (ref 0–44)
AST: 59 U/L — ABNORMAL HIGH (ref 15–41)
Albumin: 3.6 g/dL (ref 3.5–5.0)
Alkaline Phosphatase: 59 U/L (ref 38–126)
Anion gap: 10 (ref 5–15)
BILIRUBIN TOTAL: 0.7 mg/dL (ref 0.3–1.2)
BUN: 10 mg/dL (ref 8–23)
CHLORIDE: 104 mmol/L (ref 98–111)
CO2: 24 mmol/L (ref 22–32)
CREATININE: 0.96 mg/dL (ref 0.61–1.24)
Calcium: 8.8 mg/dL — ABNORMAL LOW (ref 8.9–10.3)
Glucose, Bld: 89 mg/dL (ref 70–99)
Potassium: 3.6 mmol/L (ref 3.5–5.1)
Sodium: 138 mmol/L (ref 135–145)
TOTAL PROTEIN: 6.7 g/dL (ref 6.5–8.1)

## 2018-01-07 LAB — URINALYSIS, ROUTINE W REFLEX MICROSCOPIC
BACTERIA UA: NONE SEEN
Bilirubin Urine: NEGATIVE
Glucose, UA: NEGATIVE mg/dL
KETONES UR: NEGATIVE mg/dL
LEUKOCYTES UA: NEGATIVE
Nitrite: NEGATIVE
PROTEIN: NEGATIVE mg/dL
Specific Gravity, Urine: 1.021 (ref 1.005–1.030)
pH: 5 (ref 5.0–8.0)

## 2018-01-07 LAB — CBC
HEMATOCRIT: 35.6 % — AB (ref 39.0–52.0)
Hemoglobin: 12 g/dL — ABNORMAL LOW (ref 13.0–17.0)
MCH: 32.5 pg (ref 26.0–34.0)
MCHC: 33.7 g/dL (ref 30.0–36.0)
MCV: 96.5 fL (ref 78.0–100.0)
PLATELETS: 152 10*3/uL (ref 150–400)
RBC: 3.69 MIL/uL — AB (ref 4.22–5.81)
RDW: 12.1 % (ref 11.5–15.5)
WBC: 5.1 10*3/uL (ref 4.0–10.5)

## 2018-01-07 LAB — PROTIME-INR
INR: 0.93
PROTHROMBIN TIME: 12.4 s (ref 11.4–15.2)

## 2018-01-07 LAB — TYPE AND SCREEN
ABO/RH(D): AB POS
Antibody Screen: NEGATIVE

## 2018-01-07 LAB — ABO/RH: ABO/RH(D): AB POS

## 2018-01-07 LAB — APTT: aPTT: 29 seconds (ref 24–36)

## 2018-01-07 SURGERY — ENDARTERECTOMY, FEMORAL
Anesthesia: General | Site: Groin | Laterality: Right

## 2018-01-07 MED ORDER — LACTATED RINGERS IV SOLN
INTRAVENOUS | Status: DC | PRN
  Administered 2018-01-07 (×3): via INTRAVENOUS

## 2018-01-07 MED ORDER — POLYETHYLENE GLYCOL 3350 17 G PO PACK
17.0000 g | PACK | Freq: Every day | ORAL | Status: DC | PRN
  Administered 2018-01-08: 17 g via ORAL
  Filled 2018-01-07: qty 1

## 2018-01-07 MED ORDER — PROPOFOL 10 MG/ML IV BOLUS
INTRAVENOUS | Status: DC | PRN
  Administered 2018-01-07: 150 mg via INTRAVENOUS

## 2018-01-07 MED ORDER — SODIUM CHLORIDE 0.9 % IV SOLN: 500.0000 mL | Freq: Once | INTRAVENOUS | Status: DC | PRN

## 2018-01-07 MED ORDER — ACETAMINOPHEN 325 MG PO TABS
325.0000 mg | ORAL_TABLET | ORAL | Status: DC | PRN
  Administered 2018-01-08 – 2018-01-09 (×2): 650 mg via ORAL
  Filled 2018-01-07 (×2): qty 2

## 2018-01-07 MED ORDER — VERAPAMIL HCL 2.5 MG/ML IV SOLN
5.0000 mg | INTRAVENOUS | Status: DC
  Filled 2018-01-07: qty 2

## 2018-01-07 MED ORDER — OXYCODONE HCL 5 MG PO TABS: 5.0000 mg | ORAL_TABLET | Freq: Once | ORAL | Status: DC | PRN

## 2018-01-07 MED ORDER — CLOPIDOGREL BISULFATE 75 MG PO TABS
75.0000 mg | ORAL_TABLET | Freq: Every day | ORAL | Status: DC
  Administered 2018-01-08 – 2018-01-09 (×2): 75 mg via ORAL
  Filled 2018-01-07 (×2): qty 1

## 2018-01-07 MED ORDER — GABAPENTIN 300 MG PO CAPS
300.0000 mg | ORAL_CAPSULE | Freq: Two times a day (BID) | ORAL | Status: DC
  Administered 2018-01-07 – 2018-01-09 (×4): 300 mg via ORAL
  Filled 2018-01-07 (×4): qty 1

## 2018-01-07 MED ORDER — 0.9 % SODIUM CHLORIDE (POUR BTL) OPTIME
TOPICAL | Status: DC | PRN
  Administered 2018-01-07: 2000 mL

## 2018-01-07 MED ORDER — DOCUSATE SODIUM 100 MG PO CAPS
100.0000 mg | ORAL_CAPSULE | Freq: Every day | ORAL | Status: DC
  Administered 2018-01-08: 100 mg via ORAL
  Filled 2018-01-07 (×2): qty 1

## 2018-01-07 MED ORDER — CHLORHEXIDINE GLUCONATE 4 % EX LIQD: 60.0000 mL | Freq: Once | CUTANEOUS | Status: DC

## 2018-01-07 MED ORDER — HEMOSTATIC AGENTS (NO CHARGE) OPTIME
TOPICAL | Status: DC | PRN
  Administered 2018-01-07: 1 via TOPICAL

## 2018-01-07 MED ORDER — HEPARIN SODIUM (PORCINE) 5000 UNIT/ML IJ SOLN
5000.0000 [IU] | Freq: Three times a day (TID) | INTRAMUSCULAR | Status: DC
  Administered 2018-01-08 – 2018-01-09 (×3): 5000 [IU] via SUBCUTANEOUS
  Filled 2018-01-07 (×3): qty 1

## 2018-01-07 MED ORDER — BISACODYL 10 MG RE SUPP: 10.0000 mg | Freq: Every day | RECTAL | Status: DC | PRN

## 2018-01-07 MED ORDER — NITROGLYCERIN 5 MG/ML IV SOLN
5.0000 mg | INTRAVENOUS | Status: AC
  Administered 2018-01-07: 5 mg via INTRAVENOUS
  Filled 2018-01-07: qty 1

## 2018-01-07 MED ORDER — ONDANSETRON HCL 4 MG/2ML IJ SOLN
INTRAMUSCULAR | Status: AC
  Filled 2018-01-07: qty 2

## 2018-01-07 MED ORDER — PANTOPRAZOLE SODIUM 40 MG PO TBEC
40.0000 mg | DELAYED_RELEASE_TABLET | Freq: Every day | ORAL | Status: DC
  Administered 2018-01-08 – 2018-01-09 (×2): 40 mg via ORAL
  Filled 2018-01-07 (×2): qty 1

## 2018-01-07 MED ORDER — SODIUM CHLORIDE 0.9 % IR SOLN
Status: DC | PRN
  Administered 2018-01-07: 1000 mL

## 2018-01-07 MED ORDER — MORPHINE SULFATE (PF) 2 MG/ML IV SOLN: 1.0000 mg | INTRAVENOUS | Status: DC | PRN

## 2018-01-07 MED ORDER — CEFAZOLIN SODIUM-DEXTROSE 2-4 GM/100ML-% IV SOLN
2.0000 g | INTRAVENOUS | Status: AC
  Administered 2018-01-07: 2 g via INTRAVENOUS

## 2018-01-07 MED ORDER — FENTANYL CITRATE (PF) 100 MCG/2ML IJ SOLN
INTRAMUSCULAR | Status: DC | PRN
  Administered 2018-01-07: 150 ug via INTRAVENOUS
  Administered 2018-01-07: 100 ug via INTRAVENOUS
  Administered 2018-01-07 (×2): 50 ug via INTRAVENOUS
  Administered 2018-01-07: 100 ug via INTRAVENOUS
  Administered 2018-01-07: 50 ug via INTRAVENOUS

## 2018-01-07 MED ORDER — MAGNESIUM SULFATE 2 GM/50ML IV SOLN: 2.0000 g | Freq: Every day | INTRAVENOUS | Status: DC | PRN

## 2018-01-07 MED ORDER — GUAIFENESIN-DM 100-10 MG/5ML PO SYRP: 15.0000 mL | ORAL_SOLUTION | ORAL | Status: DC | PRN

## 2018-01-07 MED ORDER — LATANOPROST 0.005 % OP SOLN
1.0000 [drp] | Freq: Every day | OPHTHALMIC | Status: DC
  Administered 2018-01-07 – 2018-01-08 (×2): 1 [drp] via OPHTHALMIC
  Filled 2018-01-07: qty 2.5

## 2018-01-07 MED ORDER — SODIUM CHLORIDE 0.9 % IV SOLN
INTRAVENOUS | Status: DC | PRN
  Administered 2018-01-07: 500 mL

## 2018-01-07 MED ORDER — ROCURONIUM BROMIDE 50 MG/5ML IV SOSY
PREFILLED_SYRINGE | INTRAVENOUS | Status: AC
  Filled 2018-01-07: qty 10

## 2018-01-07 MED ORDER — MUPIROCIN 2 % EX OINT
1.0000 "application " | TOPICAL_OINTMENT | Freq: Once | CUTANEOUS | Status: AC
  Administered 2018-01-07: 1 via TOPICAL
  Filled 2018-01-07: qty 22

## 2018-01-07 MED ORDER — TRAMADOL HCL 50 MG PO TABS
50.0000 mg | ORAL_TABLET | ORAL | Status: DC | PRN
  Administered 2018-01-08 – 2018-01-09 (×3): 50 mg via ORAL
  Filled 2018-01-07 (×3): qty 1

## 2018-01-07 MED ORDER — ALUM & MAG HYDROXIDE-SIMETH 200-200-20 MG/5ML PO SUSP: 15.0000 mL | ORAL | Status: DC | PRN

## 2018-01-07 MED ORDER — METOPROLOL TARTRATE 5 MG/5ML IV SOLN: 2.0000 mg | INTRAVENOUS | Status: DC | PRN

## 2018-01-07 MED ORDER — SUCCINYLCHOLINE CHLORIDE 200 MG/10ML IV SOSY
PREFILLED_SYRINGE | INTRAVENOUS | Status: AC
  Filled 2018-01-07: qty 10

## 2018-01-07 MED ORDER — CEFAZOLIN SODIUM-DEXTROSE 2-4 GM/100ML-% IV SOLN
2.0000 g | Freq: Three times a day (TID) | INTRAVENOUS | Status: AC
  Administered 2018-01-07 – 2018-01-08 (×2): 2 g via INTRAVENOUS
  Filled 2018-01-07 (×2): qty 100

## 2018-01-07 MED ORDER — HYDROCHLOROTHIAZIDE 25 MG PO TABS
25.0000 mg | ORAL_TABLET | Freq: Every day | ORAL | Status: DC
  Administered 2018-01-07 – 2018-01-09 (×3): 25 mg via ORAL
  Filled 2018-01-07 (×3): qty 1

## 2018-01-07 MED ORDER — PROTAMINE SULFATE 10 MG/ML IV SOLN
INTRAVENOUS | Status: DC | PRN
  Administered 2018-01-07 (×5): 10 mg via INTRAVENOUS

## 2018-01-07 MED ORDER — PHENYLEPHRINE HCL 10 MG/ML IJ SOLN
INTRAMUSCULAR | Status: DC | PRN
  Administered 2018-01-07 (×2): 100 ug via INTRAVENOUS

## 2018-01-07 MED ORDER — POTASSIUM CHLORIDE CRYS ER 20 MEQ PO TBCR
20.0000 meq | EXTENDED_RELEASE_TABLET | Freq: Every day | ORAL | Status: AC | PRN
  Administered 2018-01-08: 40 meq via ORAL
  Filled 2018-01-07: qty 2

## 2018-01-07 MED ORDER — ASPIRIN EC 81 MG PO TBEC
81.0000 mg | DELAYED_RELEASE_TABLET | Freq: Every day | ORAL | Status: DC
  Administered 2018-01-08 – 2018-01-09 (×2): 81 mg via ORAL
  Filled 2018-01-07 (×2): qty 1

## 2018-01-07 MED ORDER — VERAPAMIL HCL 2.5 MG/ML IV SOLN
5.0000 mg | INTRAVENOUS | Status: AC
  Administered 2018-01-07: 5 mg via INTRAVENOUS
  Filled 2018-01-07: qty 2

## 2018-01-07 MED ORDER — HEPARIN SODIUM (PORCINE) 1000 UNIT/ML IJ SOLN
INTRAMUSCULAR | Status: DC | PRN
  Administered 2018-01-07: 2000 [IU] via INTRAVENOUS
  Administered 2018-01-07: 6000 [IU] via INTRAVENOUS

## 2018-01-07 MED ORDER — PHENOL 1.4 % MT LIQD: 1.0000 | OROMUCOSAL | Status: DC | PRN

## 2018-01-07 MED ORDER — SODIUM CHLORIDE 0.9 % IV SOLN: INTRAVENOUS | Status: DC

## 2018-01-07 MED ORDER — HYDROCERIN EX CREA
1.0000 "application " | TOPICAL_CREAM | Freq: Two times a day (BID) | CUTANEOUS | Status: DC | PRN
  Filled 2018-01-07: qty 113

## 2018-01-07 MED ORDER — ONDANSETRON HCL 4 MG/2ML IJ SOLN
INTRAMUSCULAR | Status: DC | PRN
  Administered 2018-01-07: 4 mg via INTRAVENOUS

## 2018-01-07 MED ORDER — ONDANSETRON HCL 4 MG/2ML IJ SOLN: 4.0000 mg | Freq: Once | INTRAMUSCULAR | Status: DC | PRN

## 2018-01-07 MED ORDER — ROCURONIUM BROMIDE 50 MG/5ML IV SOSY
PREFILLED_SYRINGE | INTRAVENOUS | Status: DC | PRN
  Administered 2018-01-07: 50 mg via INTRAVENOUS
  Administered 2018-01-07: 20 mg via INTRAVENOUS

## 2018-01-07 MED ORDER — FENTANYL CITRATE (PF) 100 MCG/2ML IJ SOLN
INTRAMUSCULAR | Status: AC
  Administered 2018-01-07: 50 ug via INTRAVENOUS
  Filled 2018-01-07: qty 2

## 2018-01-07 MED ORDER — DEXAMETHASONE SODIUM PHOSPHATE 10 MG/ML IJ SOLN
INTRAMUSCULAR | Status: AC
  Filled 2018-01-07: qty 1

## 2018-01-07 MED ORDER — LABETALOL HCL 5 MG/ML IV SOLN: 10.0000 mg | INTRAVENOUS | Status: DC | PRN

## 2018-01-07 MED ORDER — OXYCODONE HCL 5 MG/5ML PO SOLN: 5.0000 mg | Freq: Once | ORAL | Status: DC | PRN

## 2018-01-07 MED ORDER — NITROGLYCERIN 5 MG/ML IV SOLN
5.0000 mg | INTRAVENOUS | Status: DC
  Filled 2018-01-07: qty 1

## 2018-01-07 MED ORDER — HYDRALAZINE HCL 20 MG/ML IJ SOLN: 5.0000 mg | INTRAMUSCULAR | Status: DC | PRN

## 2018-01-07 MED ORDER — SUGAMMADEX SODIUM 200 MG/2ML IV SOLN
INTRAVENOUS | Status: DC | PRN
  Administered 2018-01-07: 113.6 mg via INTRAVENOUS

## 2018-01-07 MED ORDER — ATORVASTATIN CALCIUM 80 MG PO TABS
80.0000 mg | ORAL_TABLET | Freq: Every day | ORAL | Status: DC
  Administered 2018-01-07 – 2018-01-08 (×2): 80 mg via ORAL
  Filled 2018-01-07 (×2): qty 1

## 2018-01-07 MED ORDER — LACTATED RINGERS IV SOLN
INTRAVENOUS | Status: DC
  Administered 2018-01-07: 09:00:00 via INTRAVENOUS

## 2018-01-07 MED ORDER — ACETAMINOPHEN 325 MG RE SUPP: 325.0000 mg | RECTAL | Status: DC | PRN

## 2018-01-07 MED ORDER — IODIXANOL 320 MG/ML IV SOLN
INTRAVENOUS | Status: DC | PRN
  Administered 2018-01-07: 43 mL via INTRAVENOUS

## 2018-01-07 MED ORDER — FENTANYL CITRATE (PF) 250 MCG/5ML IJ SOLN
INTRAMUSCULAR | Status: AC
  Filled 2018-01-07: qty 5

## 2018-01-07 MED ORDER — PROPOFOL 10 MG/ML IV BOLUS
INTRAVENOUS | Status: AC
  Filled 2018-01-07: qty 20

## 2018-01-07 MED ORDER — FENTANYL CITRATE (PF) 100 MCG/2ML IJ SOLN
25.0000 ug | INTRAMUSCULAR | Status: DC | PRN
  Administered 2018-01-07 (×2): 50 ug via INTRAVENOUS

## 2018-01-07 MED ORDER — LIDOCAINE HCL (CARDIAC) PF 100 MG/5ML IV SOSY
PREFILLED_SYRINGE | INTRAVENOUS | Status: DC | PRN
  Administered 2018-01-07: 30 mg via INTRAVENOUS

## 2018-01-07 MED ORDER — ONDANSETRON HCL 4 MG/2ML IJ SOLN: 4.0000 mg | Freq: Four times a day (QID) | INTRAMUSCULAR | Status: DC | PRN

## 2018-01-07 MED ORDER — SODIUM CHLORIDE 0.9 % IV SOLN
INTRAVENOUS | Status: AC
  Filled 2018-01-07: qty 1.2

## 2018-01-07 SURGICAL SUPPLY — 97 items
ADH SKN CLS APL DERMABOND .7 (GAUZE/BANDAGES/DRESSINGS) ×1
BAG BANDED W/RUBBER/TAPE 36X54 (MISCELLANEOUS) ×2 IMPLANT
BAG EQP BAND 135X91 W/RBR TAPE (MISCELLANEOUS) ×1
BAG ISL DRAPE 18X18 STRL (DRAPES)
BAG ISOLATION DRAPE 18X18 (DRAPES) IMPLANT
BALLN MUSTANG 10X80X75 (BALLOONS) ×2
BALLOON MUSTANG 10X80X75 (BALLOONS) IMPLANT
CANISTER SUCT 3000ML PPV (MISCELLANEOUS) ×2 IMPLANT
CATH ANGIO 5F BER2 65CM (CATHETERS) IMPLANT
CATH BEACON 5 .035 65 KMP TIP (CATHETERS) ×1 IMPLANT
CATH CROSS OVER TEMPO 5F (CATHETERS) IMPLANT
CATH CXI SUPP ANG 2.6FR 150CM (CATHETERS) IMPLANT
CATH EMB 5FR 80CM (CATHETERS) ×1 IMPLANT
CATH OMNI FLUSH .035X70CM (CATHETERS) IMPLANT
CATH QUICKCROSS SUPP .035X90CM (MICROCATHETER) IMPLANT
CATH SOFT-VU 4F 65 STRAIGHT (CATHETERS) IMPLANT
CATH SOFT-VU STRAIGHT 4F 65CM (CATHETERS)
CLIP VESOCCLUDE MED 24/CT (CLIP) ×2 IMPLANT
CLIP VESOCCLUDE MED 6/CT (CLIP) ×2 IMPLANT
CLIP VESOCCLUDE SM WIDE 24/CT (CLIP) ×2 IMPLANT
CLIP VESOCCLUDE SM WIDE 6/CT (CLIP) ×2 IMPLANT
COVER BACK TABLE 60X90IN (DRAPES) ×2 IMPLANT
COVER BACK TABLE 80X110 HD (DRAPES) ×2 IMPLANT
COVER DOME SNAP 22 D (MISCELLANEOUS) ×2 IMPLANT
COVER SURGICAL LIGHT HANDLE (MISCELLANEOUS) ×1 IMPLANT
COVER TRANSDUCER ULTRASND GEL (DRAPE) ×2 IMPLANT
DERMABOND ADVANCED (GAUZE/BANDAGES/DRESSINGS) ×1
DERMABOND ADVANCED .7 DNX12 (GAUZE/BANDAGES/DRESSINGS) ×1 IMPLANT
DRAIN CHANNEL 15F RND FF W/TCR (WOUND CARE) IMPLANT
DRAPE C-ARM 42X72 X-RAY (DRAPES) ×2 IMPLANT
DRAPE ISOLATION BAG 18X18 (DRAPES)
DRAPE UNIVERSAL (DRAPES) IMPLANT
DRAPE X-RAY CASS 24X20 (DRAPES) IMPLANT
ELECT REM PT RETURN 9FT ADLT (ELECTROSURGICAL) ×2
ELECTRODE REM PT RTRN 9FT ADLT (ELECTROSURGICAL) ×1 IMPLANT
EVACUATOR SILICONE 100CC (DRAIN) IMPLANT
GAUZE 4X4 16PLY RFD (DISPOSABLE) ×2 IMPLANT
GLIDEWIRE ANGLED SS 035X260CM (WIRE) IMPLANT
GLOVE BIOGEL PI IND STRL 7.5 (GLOVE) ×1 IMPLANT
GLOVE BIOGEL PI INDICATOR 7.5 (GLOVE) ×1
GLOVE SURG SS PI 7.5 STRL IVOR (GLOVE) ×2 IMPLANT
GOWN STRL REUS W/ TWL LRG LVL3 (GOWN DISPOSABLE) ×3 IMPLANT
GOWN STRL REUS W/ TWL XL LVL3 (GOWN DISPOSABLE) ×1 IMPLANT
GOWN STRL REUS W/TWL LRG LVL3 (GOWN DISPOSABLE) ×6
GOWN STRL REUS W/TWL XL LVL3 (GOWN DISPOSABLE) ×2
GRAFT VASC PATCH XENOSURE 1X14 (Vascular Products) ×1 IMPLANT
HEMOSTAT SNOW SURGICEL 2X4 (HEMOSTASIS) ×1 IMPLANT
KIT BASIN OR (CUSTOM PROCEDURE TRAY) ×2 IMPLANT
KIT ENCORE 26 ADVANTAGE (KITS) ×1 IMPLANT
KIT TURNOVER KIT B (KITS) ×2 IMPLANT
LUBRICANT VIPERSLIDE CORONARY (MISCELLANEOUS) ×1 IMPLANT
NDL PERC 18GX7CM (NEEDLE) ×1 IMPLANT
NEEDLE PERC 18GX7CM (NEEDLE) ×4 IMPLANT
NS IRRIG 1000ML POUR BTL (IV SOLUTION) ×4 IMPLANT
PACK PERIPHERAL VASCULAR (CUSTOM PROCEDURE TRAY) ×2 IMPLANT
PAD ARMBOARD 7.5X6 YLW CONV (MISCELLANEOUS) ×4 IMPLANT
POWDER SURGICEL 3.0 GRAM (HEMOSTASIS) ×1 IMPLANT
PROTECTION STATION PRESSURIZED (MISCELLANEOUS)
SET COLLECT BLD 21X3/4 12 (NEEDLE) IMPLANT
SHEATH AVANTI 11CM 5FR (SHEATH) IMPLANT
SHEATH BRITE TIP 7FRX11 (SHEATH) ×1 IMPLANT
SHEATH HIGHFLEX ANSEL 6FRX55 (SHEATH) IMPLANT
SHEATH PINNACLE 6F 10CM (SHEATH) IMPLANT
SHEATH PINNACLE 8F 10CM (SHEATH) ×1 IMPLANT
STATION PROTECTION PRESSURIZED (MISCELLANEOUS) IMPLANT
STENT EPIC VASCULAR 10X80X75 (Permanent Stent) ×1 IMPLANT
STENT VIABAHNBX 10X29X135 (Permanent Stent) ×1 IMPLANT
STOPCOCK 4 WAY LG BORE MALE ST (IV SETS) ×2 IMPLANT
STOPCOCK MORSE 400PSI 3WAY (MISCELLANEOUS) ×2 IMPLANT
SUT ETHILON 3 0 PS 1 (SUTURE) IMPLANT
SUT MNCRL AB 4-0 PS2 18 (SUTURE) ×2 IMPLANT
SUT PROLENE 5 0 C 1 24 (SUTURE) ×5 IMPLANT
SUT PROLENE 6 0 BV (SUTURE) ×4 IMPLANT
SUT PROLENE 6 0 CC (SUTURE) IMPLANT
SUT VIC AB 2-0 CT1 27 (SUTURE) ×2
SUT VIC AB 2-0 CT1 TAPERPNT 27 (SUTURE) ×1 IMPLANT
SUT VIC AB 2-0 CTX 36 (SUTURE) IMPLANT
SUT VIC AB 3-0 SH 27 (SUTURE) ×2
SUT VIC AB 3-0 SH 27X BRD (SUTURE) ×1 IMPLANT
SUT VICRYL 4-0 PS2 18IN ABS (SUTURE) ×3 IMPLANT
SYR 10ML LL (SYRINGE) ×6 IMPLANT
SYR 20CC LL (SYRINGE) ×4 IMPLANT
SYR 30ML LL (SYRINGE) ×4 IMPLANT
SYR MEDRAD MARK V 150ML (SYRINGE) IMPLANT
TAPE VIPERTRACK RADIOPAQ 30X (MISCELLANEOUS) IMPLANT
TAPE VIPERTRACK RADIOPAQUE (MISCELLANEOUS) ×2
TOWEL GREEN STERILE (TOWEL DISPOSABLE) ×4 IMPLANT
TOWEL GREEN STERILE FF (TOWEL DISPOSABLE) ×2 IMPLANT
TRAY FOLEY MTR SLVR 16FR STAT (SET/KITS/TRAYS/PACK) ×2 IMPLANT
TUBING EXTENTION W/L.L. (IV SETS) ×2 IMPLANT
TUBING HIGH PRESSURE 120CM (CONNECTOR) IMPLANT
UNDERPAD 30X30 (UNDERPADS AND DIAPERS) ×2 IMPLANT
WATER STERILE IRR 1000ML POUR (IV SOLUTION) ×2 IMPLANT
WIRE BENTSON .035X145CM (WIRE) ×3 IMPLANT
WIRE HI TORQ VERSACORE J 260CM (WIRE) ×1 IMPLANT
WIRE MICRO SET SILHO 5FR 7 (SHEATH) IMPLANT
WIRE VIPER ADVANCE .017X335CM (WIRE) ×1 IMPLANT

## 2018-01-07 NOTE — Anesthesia Preprocedure Evaluation (Addendum)
Anesthesia Evaluation  Patient identified by MRN, date of birth, ID band Patient awake    Reviewed: Allergy & Precautions, NPO status , Patient's Chart, lab work & pertinent test results  History of Anesthesia Complications Negative for: history of anesthetic complications  Airway Mallampati: III  TM Distance: >3 FB Neck ROM: Full   Comment: Full beard  Dental  (+) Dental Advisory Given, Missing   Pulmonary Current Smoker,    breath sounds clear to auscultation       Cardiovascular hypertension, Pt. on medications (-) angina+ Peripheral Vascular Disease   Rhythm:Regular Rate:Normal   '19 Carotid US - 1-39% b/l ICAS  '19 Myoperfusion - Nuclear stress EF: 50%. The left ventricular ejection fraction is mildly decreased (45-54%). There was no ST segment deviation noted during stress. The study is normal. There is no evidence of ischemia no evidence of previous infarction. This is a low risk study.  '19 TTE - EF 45% to 50%. Mild hypokinesis of the anteroseptal myocardium. Trivial MR, TR.    Neuro/Psych negative neurological ROS  negative psych ROS   GI/Hepatic negative GI ROS, (+)     substance abuse  marijuana use,   Endo/Other  negative endocrine ROS  Renal/GU negative Renal ROS  negative genitourinary   Musculoskeletal  (+) Arthritis ,   Abdominal   Peds  Hematology negative hematology ROS (+)   Anesthesia Other Findings   Reproductive/Obstetrics                            Anesthesia Physical Anesthesia Plan  ASA: III  Anesthesia Plan: General   Post-op Pain Management:    Induction: Intravenous  PONV Risk Score and Plan: 3 and Treatment may vary due to age or medical condition, Ondansetron and Dexamethasone  Airway Management Planned: Oral ETT  Additional Equipment: Arterial line  Intra-op Plan:   Post-operative Plan: Extubation in OR  Informed Consent: I have  reviewed the patients History and Physical, chart, labs and discussed the procedure including the risks, benefits and alternatives for the proposed anesthesia with the patient or authorized representative who has indicated his/her understanding and acceptance.   Dental advisory given  Plan Discussed with: CRNA and Anesthesiologist  Anesthesia Plan Comments:        Anesthesia Quick Evaluation

## 2018-01-07 NOTE — Anesthesia Procedure Notes (Signed)
Arterial Line Insertion Start/End9/25/2019 9:40 AM, 01/07/2018 9:54 AM Performed by: Sonda Primes, CRNA, CRNA  Patient location: Pre-op. Preanesthetic checklist: patient identified, IV checked, site marked, risks and benefits discussed, surgical consent, monitors and equipment checked, pre-op evaluation, timeout performed and anesthesia consent Lidocaine 1% used for infiltration radial was placed Catheter size: 20 G Hand hygiene performed  and maximum sterile barriers used  Allen's test indicative of satisfactory collateral circulation Attempts: 2 Procedure performed without using ultrasound guided technique. Ultrasound Notes:anatomy identified and no ultrasound evidence of intravascular and/or intraneural injection Following insertion, dressing applied and Biopatch. Post procedure assessment: normal

## 2018-01-07 NOTE — Interval H&P Note (Signed)
History and Physical Interval Note:  01/07/2018 11:10 AM  Nathan Palmer  has presented today for surgery, with the diagnosis of PERIPHERAL VASCULAR DISEASE WITH CLAUDICATION  The various methods of treatment have been discussed with the patient and family. After consideration of risks, benefits and other options for treatment, the patient has consented to  Procedure(s): ENDARTERECTOMY FEMORAL RIGHT (Right) ILIAC ATHERECTOMY RIGHT (Right) INSERTION OF ILIAC STENT RIGHT (Right) as a surgical intervention .  The patient's history has been reviewed, patient examined, no change in status, stable for surgery.  I have reviewed the patient's chart and labs.  Questions were answered to the patient's satisfaction.     Durene Cal

## 2018-01-07 NOTE — Transfer of Care (Signed)
Immediate Anesthesia Transfer of Care Note  Patient: Nathan Palmer  Procedure(s) Performed: ENDARTERECTOMY FEMORAL RIGHT (Right Groin) EXTERNAL ILIAC ATHERECTOMY (Right Groin) INSERTION OF STENT COMMON AND EXTERNAL ILIAC (Right Groin)  Patient Location: PACU  Anesthesia Type:General  Level of Consciousness: awake, alert  and oriented  Airway & Oxygen Therapy: Patient Spontanous Breathing and Patient connected to nasal cannula oxygen  Post-op Assessment: Report given to RN and Post -op Vital signs reviewed and stable  Post vital signs: Reviewed and stable  Last Vitals:  Vitals Value Taken Time  BP 143/91 01/07/2018  3:30 PM  Temp    Pulse 81 01/07/2018  3:38 PM  Resp 16 01/07/2018  3:38 PM  SpO2 99 % 01/07/2018  3:38 PM  Vitals shown include unvalidated device data.  Last Pain:  Vitals:   01/07/18 1530  PainSc: (P) 0-No pain         Complications: No apparent anesthesia complications

## 2018-01-07 NOTE — Progress Notes (Signed)
Patient received from PACU A&Ox4, VSS. Telemetry applied and CCMD notified. 

## 2018-01-07 NOTE — Anesthesia Procedure Notes (Signed)
Procedure Name: Intubation Date/Time: 01/07/2018 11:37 AM Performed by: Gwenyth Allegra, CRNA Pre-anesthesia Checklist: Patient identified, Suction available, Patient being monitored and Timeout performed Patient Re-evaluated:Patient Re-evaluated prior to induction Oxygen Delivery Method: Circle system utilized Preoxygenation: Pre-oxygenation with 100% oxygen Induction Type: IV induction Ventilation: Mask ventilation without difficulty Grade View: Grade II Tube type: Oral Tube size: 7.5 mm Number of attempts: 1 Airway Equipment and Method: Stylet Placement Confirmation: ETT inserted through vocal cords under direct vision,  positive ETCO2 and breath sounds checked- equal and bilateral Secured at: 22 cm Tube secured with: Tape Dental Injury: Teeth and Oropharynx as per pre-operative assessment

## 2018-01-07 NOTE — Op Note (Signed)
Patient name: Nathan Palmer MRN: [id number] DOB: 01-18-1951 Sex: male  01/07/2018 Pre-operative Diagnosis: Severe right leg claudication Post-operative diagnosis:  Same Surgeon:  Annamarie Major Assistants: Aldona Bar Ryne Procedure:   #1: Right external iliac, common femoral, superficial femoral, and profundofemoral artery endarterectomy with bovine pericardial patch angioplasty   #2: Distal abdominal and pelvic aortogram   #3: Orbital atherectomy, right external iliac artery   #4: Right external iliac artery stent (10 x 80 epic)   #5: Right common iliac artery stent (10 x 29 VBX) Anesthesia: General Blood Loss: 300 Specimens: None  Findings:  Occlusive plaque in the common femoral and proximal superficial femoral artery extending into the profundofemoral artery.  Endarterectomy was performed extending up into the external iliac artery and bovine pericardial patch angioplasty was performed.  I also used a CSI 2.0 classic device to perform rotational atherectomy and the external iliac artery followed by stenting using a 10 x 80 epic self-expanding stent.  I then used a 10 x 29 VBX stent in the common iliac artery landing at the aortic bifurcation.  Indications: The patient has severe right leg claudication bordering on rest pain.  He underwent angiography by Dr. Alvester Chou who performed percutaneous intervention on the left iliac system.  The patient had an occluded right femoral artery and so surgical intervention was warranted on the right side.  The risk benefits the procedure discussed with the patient.  He is eager to proceed.  Procedure:  The patient was identified in the holding area and taken to [facility]  The patient was then placed supine on the table. general anesthesia was administered.  The patient was prepped and draped in the usual sterile fashion.  A time out was called and antibiotics were administered.  A longitudinal incision was made in the right groin.  Cautery was used  about subtenons tissue down the femoral sheath which was opened sharply.  I exposed the common femoral artery up under the inguinal ligament as well as all branches.  I dissected out the superficial femoral artery for approximately 5 cm.  There were 2 main profunda arteries, both of which were individually isolated.  Once exposure was adequate, the patient was systemically heparinized.  Heparin levels were monitored with ACT levels, and appropriately dosing was performed.  I then occluded the common femoral, profundofemoral and superficial femoral artery with vascular clamps.  A #11 blade was used to make an arteriotomy along the anterior surface of the common femoral artery.  There was severe completely occlusive plaque in the common femoral artery which extended approximately 2 cm into the superficial femoral artery and into the origins of the profundofemoral artery.  A Kleinert Kuntz elevator was used to perform endarterectomy.  I was able to get a good distal endpoint in both profunda branches as well as in the superficial femoral artery.  I then inserted a Fogarty balloon up into the right external iliac artery and then used a hemostat to retrieve plaque from the distal external iliac artery.  Next, all potential embolic debris was removed.  Once I was satisfied with the endarterectomy, a bovine pericardial patch was used to perform patch angioplasty.  This is approximately a 6 cm patch going from the proximal common femoral artery down approximately 2 cm on the superficial femoral artery.  Prior to completion, the appropriate flushing maneuvers were performed and the anastomosis was completed.   Next, a 18-gauge needle was used to cannulate the middle of the bovine patch.  Using a Berenstein 2 catheter and a Bentson wire, wire access into the aorta was performed.  I then inserted an 8 [place] sheath.  A retrograde injection was performed through the sheath which showed 90% plaque within the external iliac  artery and greater than 60% plaque in the common iliac artery.  I then used a viper wire into the aorta.  A CSI 2.0 classic device was used to perform rotational atherectomy and the right external iliac artery.  Forward and backward passes were made at low medium and high speeds.  I then selected a 10 x 80 epic self-expanding stent and deployed this landing at the origin of the hypogastric artery distally.  It was molded with an 8 mm balloon.  I then elected to use a 10 x 29 balloon expandable covered VBX stent to stent the right common iliac artery.  The stent landed at the origin of the iliac artery and went down to just proximal to the hypogastric artery.  The balloon was taken to 15 atm.  A retrograde injection was then performed which showed no residual stenosis within the iliac arterial system.  The sheath was then removed and the hole in the patch was repaired with 5-0 Prolene.  There were brisk Doppler signals in the common femoral profundofemoral and superficial femoral artery.  I then reversed the heparin with 50 mg of protamine.  Once hemostasis was achieved, the femoral sheath was reapproximated with 2-0 Vicryl.  The subtenons tissue was then closed with additional layers of 3-0 Vicryl followed by 4-0 Vicryl and Dermabond on the skin.  There were no immediate complications.  The patient was successfully extubated and taken to recovery room in stable condition.   Disposition: To PACU stable   V. Annamarie Major, M.D. [facility] Office: [phone] Pager:  [phone]

## 2018-01-08 ENCOUNTER — Inpatient Hospital Stay (HOSPITAL_COMMUNITY): Payer: No Typology Code available for payment source

## 2018-01-08 ENCOUNTER — Encounter (HOSPITAL_COMMUNITY): Payer: Self-pay | Admitting: General Practice

## 2018-01-08 DIAGNOSIS — I739 Peripheral vascular disease, unspecified: Secondary | ICD-10-CM

## 2018-01-08 LAB — CBC
HEMATOCRIT: 25.1 % — AB (ref 39.0–52.0)
HEMOGLOBIN: 8.4 g/dL — AB (ref 13.0–17.0)
MCH: 32.4 pg (ref 26.0–34.0)
MCHC: 33.5 g/dL (ref 30.0–36.0)
MCV: 96.9 fL (ref 78.0–100.0)
Platelets: 127 10*3/uL — ABNORMAL LOW (ref 150–400)
RBC: 2.59 MIL/uL — ABNORMAL LOW (ref 4.22–5.81)
RDW: 12.3 % (ref 11.5–15.5)
WBC: 5.2 10*3/uL (ref 4.0–10.5)

## 2018-01-08 LAB — BASIC METABOLIC PANEL
Anion gap: 7 (ref 5–15)
BUN: 6 mg/dL — AB (ref 8–23)
CHLORIDE: 106 mmol/L (ref 98–111)
CO2: 25 mmol/L (ref 22–32)
CREATININE: 0.94 mg/dL (ref 0.61–1.24)
Calcium: 8.2 mg/dL — ABNORMAL LOW (ref 8.9–10.3)
GFR calc Af Amer: >60 mL/min (ref >60)
GFR calc non Af Amer: >60 mL/min (ref >60)
Glucose, Bld: 102 mg/dL — ABNORMAL HIGH (ref 70–99)
Potassium: 2.9 mmol/L — ABNORMAL LOW (ref 3.5–5.1)
SODIUM: 138 mmol/L (ref 135–145)

## 2018-01-08 LAB — POCT ACTIVATED CLOTTING TIME
Activated Clotting Time: 202 seconds
Activated Clotting Time: 208 seconds

## 2018-01-08 MED ORDER — TRAMADOL HCL 50 MG PO TABS: 50.0000 mg | ORAL_TABLET | Freq: Four times a day (QID) | ORAL | 0 refills | Status: DC | PRN

## 2018-01-08 MED ORDER — INFLUENZA VAC SPLIT HIGH-DOSE 0.5 ML IM SUSY: 0.5000 mL | PREFILLED_SYRINGE | INTRAMUSCULAR | Status: DC | PRN

## 2018-01-08 MED ORDER — ENSURE ENLIVE PO LIQD
237.0000 mL | Freq: Two times a day (BID) | ORAL | Status: DC
  Administered 2018-01-08 – 2018-01-09 (×2): 237 mL via ORAL

## 2018-01-08 NOTE — Progress Notes (Addendum)
[facility]  Subjective  - Doing well this am without new complaints.   Objective 106/65 77 98.7 F (37.1 C) (Oral) (!) 22 100%  Intake/Output Summary (Last 24 hours) at 01/08/2018 0747 Last data filed at 01/08/2018 0453 Gross per 24 hour  Intake 2710 ml  Output 2840 ml  Net -130 ml    Right groin soft without hematoma, incision healing well Right foot with doppler signal PT/peroneal Heart RRR Lungs non labored breathing  Assessment/Planning: POD # 1  #1: Right external iliac, common femoral, superficial femoral, and profundofemoral artery endarterectomy with bovine pericardial patch angioplasty                         #2: Distal abdominal and pelvic aortogram                         #3: Orbital atherectomy, right external iliac artery                         #4: Right external iliac artery stent (10 x 80 epic)                         #5: Right common iliac artery stent (10 x 29 VBX)  PT eval and treatment Will restart Plavix today he has Plavix at home. Discharge possible today, likely tomorrow. Pending ABI's and duplex of aortoiliacs.   Mosetta Pigeon 01/08/2018 7:47 AM --  Laboratory Lab Results: Recent Labs    01/07/18 0819 01/08/18 0359  WBC 5.1 5.2  HGB 12.0* 8.4*  HCT 35.6* 25.1*  PLT 152 127*   BMET Recent Labs    01/07/18 0819 01/08/18 0359  NA 138 138  K 3.6 2.9*  CL 104 106  CO2 24 25  GLUCOSE 89 102*  BUN 10 6*  CREATININE 0.96 0.94  CALCIUM 8.8* 8.2*    COAG Lab Results  Component Value Date   INR 0.93 01/07/2018   No results found for: PTT  POD#1:  Right groin incision without hematoma.  Brisk PT and peroneal doppler signals.  Will encourage ambulation.  Anticipate d/c today or tomorrow.  Restart Plavix.  Durene Cal

## 2018-01-08 NOTE — Progress Notes (Signed)
VASCULAR LAB PRELIMINARY  ARTERIAL  ABI completed:  Right: Resting right ankle-brachial index indicates noncompressible right lower extremity arteries.The right toe-brachial index is abnormal.   Left: Resting left ankle-brachial index indicates noncompressible left lower extremity arteries.The left toe-brachial index is abnormal.   Left toe pressure compares to the preious study decreased.    Right and Left ABIs appear essentially unchanged. Right TBIs appear increased.     RIGHT    LEFT    PRESSURE WAVEFORM  PRESSURE WAVEFORM  BRACHIAL 101 Triphasic BRACHIAL 98 Triphasic  DP 254 Monophasic DP 196 Biphasic  PT 254 Monophasic PT 143 Monophasic  GREAT TOE 34  GREAT TOE 50     RIGHT LEFT  ABI/TBI  2.51/0.34 1.94/0.50     Melodie Bouillon, RVT, RDMS 01/08/2018, 11:08 AM

## 2018-01-08 NOTE — Evaluation (Signed)
Physical Therapy Evaluation Patient Details Name: Nathan Palmer MRN: [id number] DOB: 12-17-1950 Today's Date: 01/08/2018   History of Present Illness  s/p R femoral endarterectomy and atherectomy with stenting of R iliac artery. PMH: HTN, smoker, PVD, arthritis , marijuana use, glaucoma.  Clinical Impression  Pt admitted with above diagnosis. Pt currently with functional limitations due to the deficits listed below (see PT Problem List). Pt is able to ambulate with and without RW with steady gait.  Pt feels more confident with the RW.  Encouraged to use RW at home until he feels better.  Will follow acutely to progress in hallways.   Pt will benefit from skilled PT to increase their independence and safety with mobility to allow discharge to the venue listed below.    Follow Up Recommendations No PT follow up    Equipment Recommendations  None recommended by PT    Recommendations for Other Services       Precautions / Restrictions Precautions Precautions: Fall Restrictions Weight Bearing Restrictions: No      Mobility  Bed Mobility Overal bed mobility: Independent                Transfers Overall transfer level: Independent                  Ambulation/Gait Ambulation/Gait assistance: Modified independent (Device/Increase time) Gait Distance (Feet): 110 Feet Assistive device: Rolling walker (2 wheeled) Gait Pattern/deviations: Step-through pattern;Decreased stride length   Gait velocity interpretation: <1.31 ft/sec, indicative of household ambulator General Gait Details: No issues with balance with RW and even without RW pt is steady.  Pt likes to use RW for decr pain in right groin so encouraged pt to use until pain is better.   Stairs            Wheelchair Mobility    Modified Rankin (Stroke Patients Only)       Balance Overall balance assessment: Needs assistance Sitting-balance support: No upper extremity supported;Feet supported Sitting  balance-Leahy Scale: Fair     Standing balance support: Bilateral upper extremity supported;During functional activity;No upper extremity supported Standing balance-Leahy Scale: Fair Standing balance comment: pt can balance statically without UE support                             Pertinent Vitals/Pain Pain Assessment: Faces Faces Pain Scale: Hurts even more Pain Location: right groin Pain Descriptors / Indicators: Aching;Grimacing;Guarding Pain Intervention(s): Limited activity within patient's tolerance;Monitored during session;Repositioned   HR 96-122 bpm with activity  Home Living Family/patient expects to be discharged to:: Private residence Living Arrangements: Alone Available Help at Discharge: Family;Available PRN/intermittently(daughters and sons work, daughter lives next door) Type of Home: House Home Access: Stairs to enter   Secretary/administrator of Steps: 1 Home Layout: One level Home Equipment: Environmental consultant - 4 wheels      Prior Function Level of Independence: Independent               Hand Dominance        Extremity/Trunk Assessment   Upper Extremity Assessment Upper Extremity Assessment: Defer to OT evaluation    Lower Extremity Assessment Lower Extremity Assessment: Generalized weakness    Cervical / Trunk Assessment Cervical / Trunk Assessment: Normal  Communication   Communication: No difficulties  Cognition Arousal/Alertness: Awake/alert Behavior During Therapy: WFL for tasks assessed/performed Overall Cognitive Status: Within Functional Limits for tasks assessed  General Comments      Exercises     Assessment/Plan    PT Assessment Patient needs continued PT services  PT Problem List Decreased activity tolerance;Decreased balance;Decreased mobility;Decreased knowledge of use of DME;Decreased safety awareness;Decreased knowledge of precautions;Pain       PT  Treatment Interventions DME instruction;Gait training;Functional mobility training;Therapeutic activities;Therapeutic exercise;Stair training;Balance training;Patient/family education    PT Goals (Current goals can be found in the Care Plan section)  Acute Rehab PT Goals Patient Stated Goal: to go home PT Goal Formulation: With patient Time For Goal Achievement: 01/22/18 Potential to Achieve Goals: Good    Frequency Min 3X/week   Barriers to discharge        Co-evaluation PT/OT/SLP Co-Evaluation/Treatment: Yes Reason for Co-Treatment: For patient/therapist safety PT goals addressed during session: Mobility/safety with mobility         AM-PAC PT "6 Clicks" Daily Activity  Outcome Measure Difficulty turning over in bed (including adjusting bedclothes, sheets and blankets)?: None Difficulty moving from lying on back to sitting on the side of the bed? : None Difficulty sitting down on and standing up from a chair with arms (e.g., wheelchair, bedside commode, etc,.)?: None Help needed moving to and from a bed to chair (including a wheelchair)?: None Help needed walking in hospital room?: None Help needed climbing 3-5 steps with a railing? : A Little 6 Click Score: 23    End of Session Equipment Utilized During Treatment: Gait belt Activity Tolerance: Patient limited by fatigue Patient left: in chair;with call bell/phone within reach Nurse Communication: Mobility status PT Visit Diagnosis: Muscle weakness (generalized) (M62.81);Pain Pain - Right/Left: Right Pain - part of body: (groin)    Time: 1610-9604 PT Time Calculation (min) (ACUTE ONLY): 17 min   Charges:   PT Evaluation $PT Eval Moderate Complexity: 1 Mod          Phi Avans,PT Acute Rehabilitation Services Pager:  [phone]  Office:  [phone]    Berline Lopes 01/08/2018, 11:40 AM

## 2018-01-08 NOTE — Discharge Instructions (Signed)
° °  [facility] ° °Discharge Instructions ° °Lower Extremity Angiogram; Angioplasty/Stenting ° °Please refer to the following instructions for your post-procedure care. Your surgeon or physician assistant will discuss any changes with you. ° °Activity ° °Avoid lifting more than 8 pounds (1 gallons of milk) for 72 hours (3 days) after your procedure. You may walk as much as you can tolerate. It's OK to drive after 72 hours. ° °Bathing/Showering ° °You may shower the day after your procedure. If you have a bandage, you may remove it at 24- 48 hours. Clean your incision site with mild soap and water. Pat the area dry with a clean towel. ° °Diet ° °Resume your pre-procedure diet. There are no special food restrictions following this procedure. All patients with peripheral vascular disease should follow a low fat/low cholesterol diet. In order to heal from your surgery, it is CRITICAL to get adequate nutrition. Your body requires vitamins, minerals, and protein. Vegetables are the best source of vitamins and minerals. Vegetables also provide the perfect balance of protein. Processed food has little nutritional value, so try to avoid this. ° °Medications ° °Resume taking all of your medications unless your doctor tells you not to. If your incision is causing pain, you may take over-the-counter pain relievers such as acetaminophen (Tylenol) ° °Follow Up ° °Follow up will be arranged at the time of your procedure. You may have an office visit scheduled or may be scheduled for surgery. Ask your surgeon if you have any questions. ° °Please call us immediately for any of the following conditions: °•Severe or worsening pain your legs or feet at rest or with walking. °•Increased pain, redness, drainage at your groin puncture site. °•Fever of 101 degrees or higher. °•If you have any mild or slow bleeding from your puncture site: lie down, apply firm constant pressure over the area with a piece of  gauze or a clean wash cloth for 30 minutes- no peeking!, call 911 right away if you are still bleeding after 30 minutes, or if the bleeding is heavy and unmanageable. ° °Reduce your risk factors of vascular disease: ° °Stop smoking. If you would like help call [facility] at 1-800-QUIT-NOW (1-800-784-8669) or [facility] at [phone]. °Manage your cholesterol °Maintain a desired weight °Control your diabetes °Keep your blood pressure down ° °If you have any questions, please call the office at [phone] ° °

## 2018-01-08 NOTE — Anesthesia Postprocedure Evaluation (Signed)
Anesthesia Post Note  Patient: CIAN COSTANZO  Procedure(s) Performed: ENDARTERECTOMY FEMORAL RIGHT (Right Groin) EXTERNAL ILIAC ATHERECTOMY (Right Groin) INSERTION OF STENT COMMON AND EXTERNAL ILIAC (Right Groin)     Patient location during evaluation: PACU Anesthesia Type: General Level of consciousness: awake and alert Pain management: pain level controlled Vital Signs Assessment: post-procedure vital signs reviewed and stable Respiratory status: spontaneous breathing, nonlabored ventilation, respiratory function stable and patient connected to nasal cannula oxygen Cardiovascular status: blood pressure returned to baseline and stable Postop Assessment: no apparent nausea or vomiting Anesthetic complications: no    Last Vitals:  Vitals:   01/08/18 1132 01/08/18 1548  BP: 118/76 99/64  Pulse: 83   Resp: 19 16  Temp: 37.4 C 37.5 C  SpO2: 100%     Last Pain:  Vitals:   01/08/18 1548  TempSrc: Oral  PainSc:    Pain Goal: Patients Stated Pain Goal: 2 (01/07/18 1700)               Beryle Lathe

## 2018-01-08 NOTE — Evaluation (Signed)
Occupational Therapy Evaluation and Discharge Patient Details Name: Nathan Palmer MRN: [id number] DOB: Nov 17, 1950 Today's Date: 01/08/2018    History of Present Illness s/p R femoral endarterectomy and atherectomy with stenting of R iliac artery. PMH: HTN, smoker, PVD, arthritis , marijuana use, glaucoma.   Clinical Impression   Pt is functioning modified independently in ADL and ADL transfers. No OT needs.    Follow Up Recommendations  No OT follow up    Equipment Recommendations  None recommended by OT    Recommendations for Other Services       Precautions / Restrictions Precautions Precautions: Fall Restrictions Weight Bearing Restrictions: No      Mobility Bed Mobility Overal bed mobility: Independent                Transfers Overall transfer level: Independent Equipment used: Rolling walker (2 wheeled)                  Balance Overall balance assessment: Needs assistance Sitting-balance support: No upper extremity supported;Feet supported Sitting balance-Leahy Scale: Good     Standing balance support: Bilateral upper extremity supported;During functional activity;No upper extremity supported Standing balance-Leahy Scale: Fair Standing balance comment: pt can balance statically without UE support                           ADL either performed or assessed with clinical judgement   ADL Overall ADL's : Modified independent                                             Vision Patient Visual Report: No change from baseline       Perception     Praxis      Pertinent Vitals/Pain Pain Assessment: Faces Faces Pain Scale: Hurts even more Pain Location: right groin Pain Descriptors / Indicators: Aching;Grimacing;Guarding Pain Intervention(s): Monitored during session;Repositioned     Hand Dominance Right   Extremity/Trunk Assessment Upper Extremity Assessment Upper Extremity Assessment: Overall WFL for  tasks assessed   Lower Extremity Assessment Lower Extremity Assessment: Defer to PT evaluation   Cervical / Trunk Assessment Cervical / Trunk Assessment: Normal   Communication Communication Communication: No difficulties   Cognition Arousal/Alertness: Awake/alert Behavior During Therapy: WFL for tasks assessed/performed Overall Cognitive Status: Within Functional Limits for tasks assessed                                     General Comments       Exercises     Shoulder Instructions      Home Living Family/patient expects to be discharged to:: Private residence Living Arrangements: Alone Available Help at Discharge: Family;Available PRN/intermittently(children work, daughter lives next door) Type of Home: House Home Access: Stairs to enter Secretary/administrator of Steps: 1   Home Layout: One level     Bathroom Shower/Tub: Chief Strategy Officer: Handicapped height     Home Equipment: Environmental consultant - 4 wheels          Prior Functioning/Environment Level of Independence: Independent                 OT Problem List: Pain      OT Treatment/Interventions:      OT Goals(Current goals can be found in  the care plan section) Acute Rehab OT Goals Patient Stated Goal: to go home  OT Frequency:     Barriers to D/C:            Co-evaluation   Reason for Co-Treatment: For patient/therapist safety PT goals addressed during session: Mobility/safety with mobility        AM-PAC PT "6 Clicks" Daily Activity     Outcome Measure Help from another person eating meals?: None Help from another person taking care of personal grooming?: None Help from another person toileting, which includes using toliet, bedpan, or urinal?: None Help from another person bathing (including washing, rinsing, drying)?: None Help from another person to put on and taking off regular upper body clothing?: None Help from another person to put on and taking off  regular lower body clothing?: None 6 Click Score: 24   End of Session Equipment Utilized During Treatment: Gait belt;Rolling walker  Activity Tolerance: Patient tolerated treatment well Patient left: in chair;with call bell/phone within reach  OT Visit Diagnosis: Pain                Time: 0272-5366 OT Time Calculation (min): 16 min Charges:  OT General Charges $OT Visit: 1 Visit OT Evaluation $OT Eval Low Complexity: 1 Low  Martie Round, OTR/L Acute Rehabilitation Services Pager: [phone] Office: [phone]  Nathan Palmer 01/08/2018, 1:28 PM

## 2018-01-09 ENCOUNTER — Encounter (HOSPITAL_COMMUNITY): Payer: Self-pay | Admitting: Surgery

## 2018-01-09 ENCOUNTER — Telehealth: Payer: Self-pay | Admitting: Surgery

## 2018-01-09 LAB — BASIC METABOLIC PANEL
ANION GAP: 9 (ref 5–15)
BUN: 9 mg/dL (ref 8–23)
CHLORIDE: 99 mmol/L (ref 98–111)
CO2: 27 mmol/L (ref 22–32)
Calcium: 8.1 mg/dL — ABNORMAL LOW (ref 8.9–10.3)
Creatinine, Ser: 1.1 mg/dL (ref 0.61–1.24)
GFR calc non Af Amer: >60 mL/min (ref >60)
Glucose, Bld: 149 mg/dL — ABNORMAL HIGH (ref 70–99)
POTASSIUM: 3 mmol/L — AB (ref 3.5–5.1)
Sodium: 135 mmol/L (ref 135–145)

## 2018-01-09 MED ORDER — TRAMADOL HCL 50 MG PO TABS: 50.0000 mg | ORAL_TABLET | Freq: Four times a day (QID) | ORAL | 0 refills | Status: DC | PRN

## 2018-01-09 NOTE — Discharge Summary (Signed)
Discharge Summary     Nathan Palmer [birth year] 67 y.o. male  [id number]  Admission Date: 01/07/2018  Discharge Date: 01/09/18  Physician: Nada Libman, MD  Admission Diagnosis: PERIPHERAL VASCULAR DISEASE WITH CLAUDICATION   HPI:   This is a 67 y.o. male who is referred for evaluation for evaluation of right leg claudication.  The patient has previously undergone intervention on the left common and external iliac artery which gave him symptomatic improvement on the left.  On the right he has iliac disease as well as femoral-popliteal disease.  He is here today to discuss surgical intervention.  He states he can walk less than half a block before his right leg gives out.  He does not have rest pain.  He does not have any nonhealing wounds.  The patient suffers from hypertension which has been medically managed.  He takes a statin for hypercholesterolemia.  He is a 50-pack-year smoker, currently smoking 1 pack a day.  Hospital Course:  The patient was admitted to the hospital and taken to the operating room on 01/07/2018 and underwent: Procedure:   #1: Right external iliac, common femoral, superficial femoral, and profundofemoral artery endarterectomy with bovine pericardial patch angioplasty                         #2: Distal abdominal and pelvic aortogram                         #3: Orbital atherectomy, right external iliac artery                         #4: Right external iliac artery stent (10 x 80 epic)                         #5: Right common iliac artery stent (10 x 29 VBX)  Findings:  Occlusive plaque in the common femoral and proximal superficial femoral artery extending into the profundofemoral artery.  Endarterectomy was performed extending up into the external iliac artery and bovine pericardial patch angioplasty was performed.  I also used a CSI 2.0 classic device to perform rotational atherectomy and the external iliac artery followed by stenting using a 10 x 80  epic self-expanding stent.  I then used a 10 x 29 VBX stent in the common iliac artery landing at the aortic bifurcation.    The pt tolerated the procedure well and was transported to the PACU in good condition.   By POD 1, he was doing well.  His Plavix was restarted.    ABI's and duplex as follows on 01/08/18: ABI completed:  Right: Resting right ankle-brachial index indicates noncompressible right lower extremity arteries.The right toe-brachial index is abnormal.   Left: Resting left ankle-brachial index indicates noncompressible left lower extremity arteries.The left toe-brachial index is abnormal.   Left toe pressure compares to the preious study decreased.    Right and Left ABIs appear essentially unchanged. Right TBIs appear increased.     RIGHT    LEFT    PRESSURE WAVEFORM  PRESSURE WAVEFORM  BRACHIAL 101 Triphasic BRACHIAL 98 Triphasic  DP 254 Monophasic DP 196 Biphasic  PT 254 Monophasic PT 143 Monophasic  GREAT TOE 34  GREAT TOE 50     RIGHT LEFT  ABI/TBI  2.51/0.34 1.94/0.50   By POD 2, pt was doing well and discharged home.  No PT/OT follow  up recommended.   The remainder of the hospital course consisted of increasing mobilization and increasing intake of solids without difficulty.  CBC    Component Value Date/Time   WBC 5.2 01/08/2018 0359   RBC 2.59 (L) 01/08/2018 0359   HGB 8.4 (L) 01/08/2018 0359   HGB 14.1 12/03/2017 1143   HCT 25.1 (L) 01/08/2018 0359   HCT 40.6 12/03/2017 1143   PLT 127 (L) 01/08/2018 0359   PLT 189 12/03/2017 1143   MCV 96.9 01/08/2018 0359   MCV 95 12/03/2017 1143   MCH 32.4 01/08/2018 0359   MCHC 33.5 01/08/2018 0359   RDW 12.3 01/08/2018 0359   RDW 13.5 12/03/2017 1143    BMET    Component Value Date/Time   NA 135 01/09/2018 0343   NA 139 12/03/2017 1143   K 3.0 (L) 01/09/2018 0343   CL 99 01/09/2018 0343   CO2 27 01/09/2018 0343   GLUCOSE 149 (H) 01/09/2018 0343   BUN 9 01/09/2018 0343   BUN 17  12/03/2017 1143   CREATININE 1.10 01/09/2018 0343   CALCIUM 8.1 (L) 01/09/2018 0343   GFRNONAA >60 01/09/2018 0343   GFRAA >60 01/09/2018 0343     Discharge Instructions    Discharge patient   Complete by:  As directed    Discharge disposition:  01-Home or Self Care   Discharge patient date:  01/09/2018      Discharge Diagnosis:  PERIPHERAL VASCULAR DISEASE WITH CLAUDICATION  Secondary Diagnosis: Patient Active Problem List   Diagnosis Date Noted  . PAD (peripheral artery disease) (HCC) 01/07/2018  . Claudication in peripheral vascular disease (HCC) 11/14/2017  . Essential hypertension 11/14/2017  . Hyperlipidemia 11/14/2017  . Tobacco abuse 11/14/2017   Past Medical History:  Diagnosis Date  . Arthritis   . Glaucoma   . Hyperlipidemia   . Hypertension   . Peripheral vascular disease (HCC)      Allergies as of 01/09/2018   No Known Allergies     Medication List    TAKE these medications   aspirin 81 MG tablet Take 81 mg by mouth daily.   atorvastatin 80 MG tablet Commonly known as:  LIPITOR Take 1 tablet (80 mg total) by mouth daily at 6 PM.   clopidogrel 75 MG tablet Commonly known as:  PLAVIX Take 1 tablet (75 mg total) by mouth daily with breakfast.   gabapentin 300 MG capsule Commonly known as:  NEURONTIN Take 300 mg by mouth 2 (two) times daily.   hydrochlorothiazide 25 MG tablet Commonly known as:  HYDRODIURIL Take 25 mg by mouth daily.   latanoprost 0.005 % ophthalmic solution Commonly known as:  XALATAN Place 1 drop into both eyes at bedtime.   MINERIN Crea Apply 1 application topically 2 (two) times daily as needed (itching).   traMADol 50 MG tablet Commonly known as:  ULTRAM Take 1 tablet (50 mg total) by mouth every 6 (six) hours as needed (pain). What changed:  when to take this       Discharge Instructions: [facility] Discharge instructions Lower Extremity Bypass Surgery  Please refer to the  following instruction for your post-procedure care. Your surgeon or physician assistant will discuss any changes with you.  Activity  You are encouraged to walk as much as you can. You can slowly return to normal activities during the month after your surgery. Avoid strenuous activity and heavy lifting until your doctor tells you it's OK. Avoid activities such as vacuuming or  swinging a golf club. Do not drive until your doctor give the OK and you are no longer taking prescription pain medications. It is also normal to have difficulty with sleep habits, eating and bowel movement after surgery. These will go away with time.  Bathing/Showering  You may shower after you go home. Do not soak in a bathtub, hot tub, or swim until the incision heals completely.  Incision Care  Clean your incision with mild soap and water. Shower every day. Pat the area dry with a clean towel. You do not need a bandage unless otherwise instructed. Do not apply any ointments or creams to your incision. If you have open wounds you will be instructed how to care for them or a visiting nurse may be arranged for you. If you have staples or sutures along your incision they will be removed at your post-op appointment. You may have skin glue on your incision. Do not peel it off. It will come off on its own in about one week.  Wash the groin wound with soap and water daily and pat dry. (No tub bath-only shower)  Then put a dry gauze or washcloth in the groin to keep this area dry to help prevent wound infection.  Do this daily and as needed.  Do not use Vaseline or neosporin on your incisions.  Only use soap and water on your incisions and then protect and keep dry.  Diet  Resume your normal diet. There are no special food restrictions following this procedure. A low fat/ low cholesterol diet is recommended for all patients with vascular disease. In order to heal from your surgery, it is CRITICAL to get adequate nutrition. Your  body requires vitamins, minerals, and protein. Vegetables are the best source of vitamins and minerals. Vegetables also provide the perfect balance of protein. Processed food has little nutritional value, so try to avoid this.  Medications  Resume taking all your medications unless your doctor or Physician Assistant tells you not to. If your incision is causing pain, you may take over-the-counter pain relievers such as acetaminophen (Tylenol). If you were prescribed a stronger pain medication, please aware these medication can cause nausea and constipation. Prevent nausea by taking the medication with a snack or meal. Avoid constipation by drinking plenty of fluids and eating foods with high amount of fiber, such as fruits, vegetables, and grains. Take Colace 100 mg (an over-the-counter stool softener) twice a day as needed for constipation. Do not take Tylenol if you are taking prescription pain medications.  Follow Up  Our office will schedule a follow up appointment 2-3 weeks following discharge.  Please call us immediately for any of the following conditions  .Severe or worsening pain in your legs or feet while at rest or while walking .Increase pain, redness, warmth, or drainage (pus) from your incision site(s) Fever of 101 degree or higher The swelling in your leg with the bypass suddenly worsens and becomes more painful than when you were in the hospital If you have been instructed to feel your graft pulse then you should do so every day. If you can no longer feel this pulse, call the office immediately. Not all patients are given this instruction.  Leg swelling is common after leg bypass surgery.  The swelling should improve over a few months following surgery. To improve the swelling, you may elevate your legs above the level of your heart while you are sitting or resting. Your surgeon or physician assistant may ask you  to apply an ACE wrap or wear compression (TED) stockings to help to  reduce swelling.  Reduce your risk of vascular disease  Stop smoking. If you would like help call [facility] at 1-800-QUIT-NOW (9406305543) or [place] at (714)258-6138.  Manage your cholesterol Maintain a desired weight Control your diabetes weight Control your diabetes Keep your blood pressure down  If you have any questions, please call the office at [phone]   Prescriptions given: 1. Tramadol#30 No Refill  Disposition: home  Patient's condition: is Good  Follow up: 1. Dr. Myra Gianotti in 2 weeks with ABI's   Doreatha Massed, PA-C [facility] [phone] 01/09/2018  8:10 AM  - For VQI Registry use ---   Post-op:  Wound infection: No  Graft infection: No  Transfusion: No    If yes, n/a units given New Arrhythmia: No Ipsilateral amputation: No, [ ]  Minor, [ ]  BKA, [ ]  AKA Discharge patency: [x ] Primary, [ ]  Primary assisted, [ ]  Secondary, [ ]  Occluded Patency judged by: [x ] Dopper only, [ ]  Palpable graft pulse, []  Palpable distal pulse, [ ]  ABI inc. > 0.15, [ ]  Duplex Discharge ABI: R not done, L not done D/C Ambulatory Status: Ambulatory  Complications: MI: No, [ ]  Troponin only, [ ]  EKG or Clinical CHF: No Resp failure:No, [ ]  Pneumonia, [ ]  Ventilator Chg in renal function: No, [ ]  Inc. Cr > 0.5, [ ]  Temp. Dialysis,  [ ]  Permanent dialysis Stroke: No, [ ]  Minor, [ ]  Major Return to OR: No  Reason for return to OR: [ ]  Bleeding, [ ]  Infection, [ ]  Thrombosis, [ ]  Revision  Discharge medications: Statin use:  yes ASA use:  yes Plavix use:  yes Beta blocker use: no CCB use:  No ACEI use:   no ARB use:  no Coumadin use: no

## 2018-01-09 NOTE — Progress Notes (Signed)
[facility] [place]  Subjective  - Doing well this am without new complaints.  Ready to go home.  States his right leg feels much better.   Objective 97/65 84 98.6 F (37 C) (Oral) 18 97%  Intake/Output Summary (Last 24 hours) at 01/09/2018 0756 Last data filed at 01/09/2018 0500 Gross per 24 hour  Intake 1320 ml  Output 1500 ml  Net -180 ml    Right groin soft without hematoma, incision healing well Right foot warm with doppler signals   Assessment/Planning: POD #2 s/p #1 right external iliac, common femoral, superficial femoral, and profundofemoral artery endarterectomy with bovine pericardial patch angioplasty  #2: Distal abdominal and pelvic aortogram  #3: Orbital atherectomy, right external iliac artery  #4: Right external iliac artery stent (10 x 80 epic) #5: Right common iliac artery stent (10 x 29 VBX)  Discharge home today.  Continue Plavix.  Will arrange follow-up in 2 weeks with ABI's.   Cephus Shelling 01/09/2018 7:56 AM --  Laboratory Lab Results: Recent Labs    01/07/18 0819 01/08/18 0359  WBC 5.1 5.2  HGB 12.0* 8.4*  HCT 35.6* 25.1*  PLT 152 127*   BMET Recent Labs    01/08/18 0359 01/09/18 0343  NA 138 135  K 2.9* 3.0*  CL 106 99  CO2 25 27  GLUCOSE 102* 149*  BUN 6* 9  CREATININE 0.94 1.10  CALCIUM 8.2* 8.1*    COAG Lab Results  Component Value Date   INR 0.93 01/07/2018   No results found for: PTT  Cephus Shelling, MD [facility] [place] Office: [phone] Pager: [phone]

## 2018-01-09 NOTE — Progress Notes (Signed)
D/c instructions given to pt. Wound care and prescriptions reviewed. Prescription for tramadol given. Iv removed, clean and intact. Family member to escort home.  Versie Starks, RN

## 2018-01-09 NOTE — Telephone Encounter (Signed)
sch appt spk to pt 02/09/18 915am p/o MD

## 2018-01-09 NOTE — Care Management Note (Signed)
Case Management Note Donn Pierini RN, BSN Unit 4E- RN Care Coordinator  [phone]  Patient Details  Name: DEMONT LINFORD MRN: [id number] Date of Birth: 08-30-1950  Subjective/Objective:    Pt admitted s/p endovascular stents and endarterectomy.                  Action/Plan: PTA pt lived at home alone, notified by [facility] with [facility] that pt has pre-op referral from Vascular office for [facility] needs- Berkley Harvey has been submitted to [place]. - CM has notified Tiffany today of pt's transition home- [facility] will f/u with pt at home post discharge- no other CM needs noted.   Expected Discharge Date:  01/09/18               Expected Discharge Plan:  Home w Home Health Services  In-House Referral:  NA  Discharge planning Services  CM Consult  Post Acute Care Choice:  Home Health Choice offered to:  NA  DME Arranged:    DME Agency:     HH Arranged:    HH Agency:  [facility]  Status of Service:  Completed, signed off  If discussed at Long Length of Stay Meetings, dates discussed:    Discharge Disposition: home/home health   Additional Comments:  Nathan Span, RN 01/09/2018, 10:17 AM

## 2018-02-09 ENCOUNTER — Other Ambulatory Visit: Payer: Self-pay

## 2018-02-09 ENCOUNTER — Ambulatory Visit (INDEPENDENT_AMBULATORY_CARE_PROVIDER_SITE_OTHER): Payer: No Typology Code available for payment source | Admitting: Surgery

## 2018-02-09 ENCOUNTER — Encounter: Payer: Self-pay | Admitting: Surgery

## 2018-02-09 VITALS — BP 113/80 | HR 84 | Temp 98.8°F | Resp 18 | Ht 69.0 in | Wt 125.0 lb

## 2018-02-09 DIAGNOSIS — I70213 Atherosclerosis of native arteries of extremities with intermittent claudication, bilateral legs: Secondary | ICD-10-CM

## 2018-02-09 NOTE — Progress Notes (Signed)
   Patient name: Nathan Palmer MRN: [id number] DOB: 1951/04/13 Sex: male  REASON FOR VISIT:     post op  HISTORY OF PRESENT ILLNESS:   Nathan Palmer is a 67 y.o. male who is status post right iliofemoral endarterectomy with patch angioplasty as well as orbital atherectomy of the right external iliac artery and stenting of his right common and external iliac artery.  He had previously undergone atherectomy and stenting of his left iliac system by Dr. Allyson Sabal  The patient states that his legs are 100% better and he is very appreciative  CURRENT MEDICATIONS:    Current Outpatient Medications  Medication Sig Dispense Refill  . aspirin 81 MG tablet Take 81 mg by mouth daily.    Marland Kitchen atorvastatin (LIPITOR) 80 MG tablet Take 1 tablet (80 mg total) by mouth daily at 6 PM. 90 tablet 3  . clopidogrel (PLAVIX) 75 MG tablet Take 1 tablet (75 mg total) by mouth daily with breakfast. 90 tablet 3  . gabapentin (NEURONTIN) 300 MG capsule Take 300 mg by mouth 2 (two) times daily.    . hydrochlorothiazide (HYDRODIURIL) 25 MG tablet Take 25 mg by mouth daily.    Marland Kitchen latanoprost (XALATAN) 0.005 % ophthalmic solution Place 1 drop into both eyes at bedtime.    . Skin Protectants, Misc. (MINERIN) CREA Apply 1 application topically 2 (two) times daily as needed (itching).    . traMADol (ULTRAM) 50 MG tablet Take 1 tablet (50 mg total) by mouth every 6 (six) hours as needed (pain). 30 tablet 0   No current facility-administered medications for this visit.     REVIEW OF SYSTEMS:   [X]  denotes positive finding, [ ]  denotes negative finding Cardiac  Comments:  Chest pain or chest pressure:    Shortness of breath upon exertion:    Short of breath when lying flat:    Irregular heart rhythm:    Constitutional    Fever or chills:      PHYSICAL EXAM:   Vitals:   02/09/18 0934  BP: 113/80  Pulse: 84  Resp: 18  Temp: 98.8 F (37.1 C)  TempSrc: Oral  SpO2: 98%  Weight:  125 lb (56.7 kg)  Height: 5\' 9"  (1.753 m)    GENERAL: The patient is a well-nourished male, in no acute distress. The vital signs are documented above. CARDIOVASCULAR: There is a regular rate and rhythm. PULMONARY: Non-labored respirations Incision in the right groin is healing nicely.  STUDIES:   None   MEDICAL ISSUES:   Status post right femoral endarterectomy with patch angioplasty and atherectomy and stenting of the right common and external iliac arteries.  The patient states his symptoms are dramatically improved.  He no longer has claudication.  His incision is healing nicely.  I will have the patient get his follow-up with Dr. Allyson Sabal.  He will follow-up with me as needed.  Durene Cal, MD [facility] [phone] Pager [phone]

## 2018-03-17 ENCOUNTER — Encounter: Payer: Self-pay | Admitting: Family

## 2018-03-17 ENCOUNTER — Telehealth: Payer: Self-pay | Admitting: *Deleted

## 2018-03-17 ENCOUNTER — Ambulatory Visit (INDEPENDENT_AMBULATORY_CARE_PROVIDER_SITE_OTHER): Payer: No Typology Code available for payment source | Admitting: Family

## 2018-03-17 VITALS — BP 129/88 | HR 104 | Temp 99.4°F | Resp 14 | Ht 69.0 in | Wt 124.0 lb

## 2018-03-17 DIAGNOSIS — F17200 Nicotine dependence, unspecified, uncomplicated: Secondary | ICD-10-CM

## 2018-03-17 DIAGNOSIS — I779 Disorder of arteries and arterioles, unspecified: Secondary | ICD-10-CM

## 2018-03-17 NOTE — Progress Notes (Signed)
[facility] [place]   CC: Follow up peripheral artery occlusive disease  History of Present Illness Nathan Palmer is a 67 y.o. male who is status post right iliofemoral endarterectomy with patch angioplasty as well as orbital atherectomy of the right external iliac artery and stenting of his right common and external iliac artery on 01-07-18 by Dr. Myra Palmer.  He had previously undergone atherectomy and stenting of his left iliac system by Dr. Allyson Palmer.  The patient states that his legs are 100% better and he is very appreciative.  Pt was last evaluated on 02-09-18 by Dr. Myra Palmer. At that time his symptoms were dramatically improved.  He no longer had claudication.  His incisions were healing nicely.  Dr. Myra Palmer advised the patient get his follow-up with Dr. Allyson Palmer.  He was to follow-up with us as needed.  Pt returns today with  c/o pain and swelling at right groin. Pain 6 out of 10 no relief with pain medication.  States this has been ongoing since surgery in September. He states right calf claudication remains resolved. He states pain in right groin and right medial upper thigh are the same since surgery, no worse with walking. He states he has been chopping wood for his stove for the last month, 1-2x/week.       He denies any known hx of stroke or TIA.    Diabetic: No Tobacco use: smoker  (1/4 ppd x 40 yrs), he states he takes pills from the [facility] that have helped him cut back a great deal  Pt meds include: Statin :Yes Betablocker: No ASA: Yes Other anticoagulants/antiplatelets: Plavix  Past Medical History:  Diagnosis Date  . Arthritis   . Glaucoma   . Hyperlipidemia   . Hypertension   . Peripheral vascular disease [facility](HCC)     Social History Social History   Tobacco Use  . Smoking status: Current Every Day Smoker    Packs/day: 0.25    Years: 40.00    Pack years: 10.00    Types: Cigarettes    Last attempt to quit: 12/04/2017    Years since quitting: 0.2   . Smokeless tobacco: Never Used  Substance Use Topics  . Alcohol use: Yes    Comment:  40 ounce a week  . Drug use: Yes    Types: Marijuana    Comment: 01/06/18 - last time was 1 week ago    Family History History reviewed. No pertinent family history.  Past Surgical History:  Procedure Laterality Date  . ABDOMINAL AORTAGRAM Left 12/04/2017   ABDOMINAL AORTOGRAM Palmer/LOWER EXTREMITY (N/A) as a surgical intervention   . ABDOMINAL AORTOGRAM Palmer/LOWER EXTREMITY N/A 12/04/2017   Procedure: ABDOMINAL AORTOGRAM Palmer/LOWER EXTREMITY;  Surgeon: Nathan Palmer, Nathan J, MD;  Location: [facility] INVASIVE CV LAB;  Service: Cardiovascular;  Laterality: N/A;  . COLONOSCOPY Palmer/ POLYPECTOMY    . ENDARTERECTOMY FEMORAL Right 01/07/2018   Procedure: ENDARTERECTOMY FEMORAL RIGHT;  Surgeon: Nada LibmanBrabham, Nathan W, MD;  [facility];  Service: Vascular;  Laterality: Right;  . FEMORAL ENDARTERECTOMY Right 01/07/2018  . FOOT SURGERY Bilateral    bone spurs  . ILIAC ATHERECTOMY Right 01/07/2018   Procedure: EXTERNAL ILIAC ATHERECTOMY;  Surgeon: Nada LibmanBrabham, Nathan W, MD;  [facility];  Service: Vascular;  Laterality: Right;  . INSERTION OF ILIAC STENT Right 01/07/2018   Procedure: INSERTION OF STENT COMMON AND EXTERNAL ILIAC;  Surgeon: Nada LibmanBrabham, Nathan W, MD;  Location: [facility] OR;  Service: Vascular;  Laterality: Right;  . PERIPHERAL VASCULAR INTERVENTION  Left 12/04/2017   Procedure: PERIPHERAL VASCULAR INTERVENTION;  Surgeon: Nathan Gess, MD;  Location: [facility] INVASIVE CV LAB;  Service: Cardiovascular;  Laterality: Left;  left external and common    No Known Allergies  Current Outpatient Medications  Medication Sig Dispense Refill  . aspirin 81 MG tablet Take 81 mg by mouth daily.    Marland Kitchen atorvastatin (LIPITOR) 80 MG tablet Take 1 tablet (80 mg total) by mouth daily at 6 PM. 90 tablet 3  . clopidogrel (PLAVIX) 75 MG tablet Take 1 tablet (75 mg total) by mouth daily with breakfast. 90 tablet 3  . gabapentin (NEURONTIN) 300 MG capsule  Take 300 mg by mouth 2 (two) times daily.    . hydrochlorothiazide (HYDRODIURIL) 25 MG tablet Take 25 mg by mouth daily.    Marland Kitchen latanoprost (XALATAN) 0.005 % ophthalmic solution Place 1 drop into both eyes at bedtime.    . salsalate (DISALCID) 750 MG tablet Take 750 mg by mouth 2 (two) times daily.    . Skin Protectants, Misc. (MINERIN) CREA Apply 1 application topically 2 (two) times daily as needed (itching).    . traMADol (ULTRAM) 50 MG tablet Take 1 tablet (50 mg total) by mouth every 6 (six) hours as needed (pain). 30 tablet 0   No current facility-administered medications for this visit.     ROS: See HPI for pertinent positives and negatives.   Physical Examination  Vitals:   03/17/18 1554  BP: 129/88  Pulse: (!) 104  Resp: 14  Temp: 99.4 F (37.4 C)  SpO2: 98%  Weight: 124 lb (56.2 kg)  Height: 5\' 9"  (1.753 m)   Body mass index is 18.31 kg/m.  General: A&O x 3, WDWN, male. Gait: normal HENT: No gross abnormalities.  Eyes: PERRLA. Pulmonary: Respirations are non labored, CTAB, good air movement in all fields Cardiac: regular rhythm, no detected murmur.         Carotid Bruits Right Left   Negative Negative   Radial pulses are 2+ palpable bilaterally   Adominal aortic pulse is not palpable                         VASCULAR EXAM: Extremities without ischemic changes, without Gangrene; without open wounds.                                                                                                           LE Pulses Right Left       FEMORAL  2+ palpable  2+ palpable        POPLITEAL  2+ palpable   not  palpable       POSTERIOR TIBIAL  faintly palpable   1+ palpable        DORSALIS PEDIS      ANTERIOR TIBIAL 1+ palpable  1+ palpable    Abdomen: soft, NT, no palpable masses. There is hypertrophied scar tissue in right groin, incisions fully healed.  Skin: no rashes, no cellulitis, no ulcers noted. Musculoskeletal: no muscle wasting or  atrophy.  Neurologic: A&O X 3; appropriate affect, Sensation is normal; MOTOR FUNCTION:  moving all extremities equally, motor strength 5/5 throughout. Speech is fluent/normal. CN 2-12 intact. Psychiatric: Thought content is normal, mood appropriate for clinical situation.     ASSESSMENT: Nathan Palmer is a 67 y.o. male who is status post right iliofemoral endarterectomy with patch angioplasty as well as orbital atherectomy of the right external iliac artery and stenting of his right common and external iliac artery on 01-07-18 by Dr. Myra Gianotti.  He had previously undergone atherectomy and stenting of his left iliac system by Dr. Allyson Palmer.   No swelling noted nor palpated in the right groin and right upper medical thigh area of concern to pt.  Bilateral femoral and pedal pulses are palpable. Right calf claudication remains resolved. I discussed with Dr. Arbie Cookey pt concerns.  His pain may be from irritation of nerves in this area due to multiple procedures performed in the right CIA and right EIA and right iliofemoral arteries.   Hopefully the pain he is experiencing will diminish over the ext few months.    PLAN:  Follow up with Dr. Allyson Palmer as scheduled. Follow up with [place] as needed.   I discussed in depth with the patient the nature of atherosclerosis, and emphasized the importance of maximal medical management including strict control of blood pressure, blood glucose, and lipid levels, obtaining regular exercise, and cessation of smoking.  The patient is aware that without maximal medical management the underlying atherosclerotic disease process will progress, limiting the benefit of any interventions.  The patient was given information about PAD including signs, symptoms, treatment, what symptoms should prompt the patient to seek immediate medical care, and risk reduction measures to take.  Charisse March, RN, MSN, FNP-C [facility] [place] Phone:  [phone]  Clinic MD: Bonnye Fava  03/17/18 4:06 PM

## 2018-03-17 NOTE — Patient Instructions (Signed)
Steps to Quit Smoking Smoking tobacco can be bad for your health. It can also affect almost every organ in your body. Smoking puts you and people around you at risk for many serious long-lasting (chronic) diseases. Quitting smoking is hard, but it is one of the best things that you can do for your health. It is never too late to quit. What are the benefits of quitting smoking? When you quit smoking, you lower your risk for getting serious diseases and conditions. They can include:  Lung cancer or lung disease.  Heart disease.  Stroke.  Heart attack.  Not being able to have children (infertility).  Weak bones (osteoporosis) and broken bones (fractures).  If you have coughing, wheezing, and shortness of breath, those symptoms may get better when you quit. You may also get sick less often. If you are pregnant, quitting smoking can help to lower your chances of having a baby of low birth weight. What can I do to help me quit smoking? Talk with your doctor about what can help you quit smoking. Some things you can do (strategies) include:  Quitting smoking totally, instead of slowly cutting back how much you smoke over a period of time.  Going to in-person counseling. You are more likely to quit if you go to many counseling sessions.  Using resources and support systems, such as: ? Online chats with a counselor. ? Phone quitlines. ? Printed self-help materials. ? Support groups or group counseling. ? Text messaging programs. ? Mobile phone apps or applications.  Taking medicines. Some of these medicines may have nicotine in them. If you are pregnant or breastfeeding, do not take any medicines to quit smoking unless your doctor says it is okay. Talk with your doctor about counseling or other things that can help you.  Talk with your doctor about using more than one strategy at the same time, such as taking medicines while you are also going to in-person counseling. This can help make  quitting easier. What things can I do to make it easier to quit? Quitting smoking might feel very hard at first, but there is a lot that you can do to make it easier. Take these steps:  Talk to your family and friends. Ask them to support and encourage you.  Call phone quitlines, reach out to support groups, or work with a counselor.  Ask people who smoke to not smoke around you.  Avoid places that make you want (trigger) to smoke, such as: ? Bars. ? Parties. ? Smoke-break areas at work.  Spend time with people who do not smoke.  Lower the stress in your life. Stress can make you want to smoke. Try these things to help your stress: ? Getting regular exercise. ? Deep-breathing exercises. ? Yoga. ? Meditating. ? Doing a body scan. To do this, close your eyes, focus on one area of your body at a time from head to toe, and notice which parts of your body are tense. Try to relax the muscles in those areas.  Download or buy apps on your mobile phone or tablet that can help you stick to your quit plan. There are many free apps, such as QuitGuide from the CDC (Centers for Disease Control and Prevention). You can find more support from smokefree.gov and other websites.  This information is not intended to replace advice given to you by your health care provider. Make sure you discuss any questions you have with your health care provider. Document Released: 01/26/2009 Document   Revised: 11/28/2015 Document Reviewed: 08/16/2014 Elsevier Interactive Patient Education  2018 Elsevier Inc.     Peripheral Vascular Disease Peripheral vascular disease (PVD) is a disease of the blood vessels that are not part of your heart and brain. A simple term for PVD is poor circulation. In most cases, PVD narrows the blood vessels that carry blood from your heart to the rest of your body. This can result in a decreased supply of blood to your arms, legs, and internal organs, like your stomach or kidneys.  However, it most often affects a person's lower legs and feet. There are two types of PVD.  Organic PVD. This is the more common type. It is caused by damage to the structure of blood vessels.  Functional PVD. This is caused by conditions that make blood vessels contract and tighten (spasm).  Without treatment, PVD tends to get worse over time. PVD can also lead to acute ischemic limb. This is when an arm or limb suddenly has trouble getting enough blood. This is a medical emergency. Follow these instructions at home:  Take medicines only as told by your doctor.  Do not use any tobacco products, including cigarettes, chewing tobacco, or electronic cigarettes. If you need help quitting, ask your doctor.  Lose weight if you are overweight, and maintain a healthy weight as told by your doctor.  Eat a diet that is low in fat and cholesterol. If you need help, ask your doctor.  Exercise regularly. Ask your doctor for some good activities for you.  Take good care of your feet. ? Wear comfortable shoes that fit well. ? Check your feet often for any cuts or sores. Contact a doctor if:  You have cramps in your legs while walking.  You have leg pain when you are at rest.  You have coldness in a leg or foot.  Your skin changes.  You are unable to get or have an erection (erectile dysfunction).  You have cuts or sores on your feet that are not healing. Get help right away if:  Your arm or leg turns cold and blue.  Your arms or legs become red, warm, swollen, painful, or numb.  You have chest pain or trouble breathing.  You suddenly have weakness in your face, arm, or leg.  You become very confused or you cannot speak.  You suddenly have a very bad headache.  You suddenly cannot see. This information is not intended to replace advice given to you by your health care provider. Make sure you discuss any questions you have with your health care provider. Document Released:  06/26/2009 Document Revised: 09/07/2015 Document Reviewed: 09/09/2013 Elsevier Interactive Patient Education  2017 Elsevier Inc.  

## 2018-03-17 NOTE — Telephone Encounter (Signed)
Patient called c/o pain and swelling at right groin. Pain 6 out of 10 no relief with pain medication.Pain increases with walking.  States this has been ongoing since surgery in September. Agreeable to see NP today. Appt given.

## 2018-03-25 ENCOUNTER — Encounter: Payer: Self-pay | Admitting: Cardiovascular Disease

## 2018-03-25 ENCOUNTER — Ambulatory Visit (INDEPENDENT_AMBULATORY_CARE_PROVIDER_SITE_OTHER): Payer: No Typology Code available for payment source | Admitting: Cardiovascular Disease

## 2018-03-25 VITALS — BP 122/82 | HR 92 | Ht 68.0 in | Wt 124.8 lb

## 2018-03-25 DIAGNOSIS — I739 Peripheral vascular disease, unspecified: Secondary | ICD-10-CM | POA: Diagnosis not present

## 2018-03-25 DIAGNOSIS — I10 Essential (primary) hypertension: Secondary | ICD-10-CM

## 2018-03-25 NOTE — Assessment & Plan Note (Signed)
History of PAD with lifestyle limiting claudication.  He underwent Dynabac orbital rotational atherectomy, VBX covered stenting of the calcified left proximal common iliac artery with nitinol self-expanding stenting of the left external iliac artery on 12/04/2017.  He did have high-grade calcified disease of the right common and external iliac artery as well as occlusion of his right common femoral artery.  He underwent common femoral endarterectomy with patch angioplasty as well as diamondback orbital rotational atherectomy and stenting of his right iliac system.  His claudication is markedly improved.  His surgical incision is well-healed.  He does have residual 90% calcified focal mid right SFA and popliteal stenosis which are percutaneously addressable in the future.  He is on aspirin and Plavix.

## 2018-03-25 NOTE — Assessment & Plan Note (Signed)
History of tobacco abuse now smoking 1/2 pack/day down from 1 pack/day with the intent to stop

## 2018-03-25 NOTE — Assessment & Plan Note (Signed)
History of hyperlipidemia on statin therapy with lipid profile performed 12/29/2017 revealing LDL of 18 and HDL 46.

## 2018-03-25 NOTE — Assessment & Plan Note (Signed)
History of essential hypertension with blood pressure measured today at 122/82.  He is on hydrochlorothiazide.  Continue current meds at current dosing.

## 2018-03-25 NOTE — Progress Notes (Signed)
03/25/2018 Nathan Palmer   1950/05/07  [id number]  Primary Physician Borum, Vista Mink, MD Primary Cardiologist: Nathan Gess MD Nathan Palmer  HPI:  Nathan Palmer is a 67 y.o.  thin appearing divorced African-American male father of 3, grandfather one grandchild referred by the [facility] for evaluation of claudication.   I last saw him in the office 12/24/2017.  He has a history of treated hypertension, untreated mild hyperlipidemia and greater than 50 pack years of tobacco abuse currently smoking 1 pack/day. There is no family history of heart disease. Never had a heart attack or stroke and denies chest pain or shortness of breath. He has complained of right greater than left lower extremity lifestyle limiting claudication less than 1 block for the last year and was referred here for further evaluation of this. I performed peripheral angiography 12/04/2017 revealing bilateral iliac disease as well as right common femoral SFA and popliteal disease.  I performed diamondback orbital rotational atherectomy, PTA and covered stenting (VBX) of his left common and external iliac artery.  His ABIs did not change but his velocities improved.  His claudication on the left has markedly improved as well.    He did have residual right common and external iliac artery calcified high-grade stenoses as well as an occluded right common femoral artery that was calcified, focal mid right SFA and popliteal artery stenosis.  I referred him to Dr. Myra Palmer who performed endarterectomy and patch angioplasty as well as diamondback orbital rotational atherectomy and stenting of his right common and external neck artery as well as his right common femoral artery with an excellent result.  His wound is well-healed.  His Dopplers have improved.  His claudication has resolved.  He is on aspirin Plavix.  He is reduced his smoking from 1 pack to 1/2 pack/day.  Current Meds  Medication  Sig  . aspirin 81 MG tablet Take 81 mg by mouth daily.  Marland Kitchen atorvastatin (LIPITOR) 80 MG tablet Take 1 tablet (80 mg total) by mouth daily at 6 PM.  . clopidogrel (PLAVIX) 75 MG tablet Take 1 tablet (75 mg total) by mouth daily with breakfast.  . gabapentin (NEURONTIN) 300 MG capsule Take 300 mg by mouth 2 (two) times daily.  . hydrochlorothiazide (HYDRODIURIL) 25 MG tablet Take 25 mg by mouth daily.  Marland Kitchen latanoprost (XALATAN) 0.005 % ophthalmic solution Place 1 drop into both eyes at bedtime.  . salsalate (DISALCID) 750 MG tablet Take 750 mg by mouth 2 (two) times daily.  . Skin Protectants, Misc. (MINERIN) CREA Apply 1 application topically 2 (two) times daily as needed (itching).  . traMADol (ULTRAM) 50 MG tablet Take 1 tablet (50 mg total) by mouth every 6 (six) hours as needed (pain).     No Known Allergies  Social History   Socioeconomic History  . Marital status: Divorced    Spouse name: Not on file  . Number of children: Not on file  . Years of education: Not on file  . Highest education level: Not on file  Occupational History  . Not on file  Social Needs  . Financial resource strain: Not on file  . Food insecurity:    Worry: Not on file    Inability: Not on file  . Transportation needs:    Medical: Not on file    Non-medical: Not on file  Tobacco Use  . Smoking status: Current Every Day Smoker    Packs/day: 0.25  Years: 40.00    Pack years: 10.00    Types: Cigarettes    Last attempt to quit: 12/04/2017    Years since quitting: 0.3  . Smokeless tobacco: Never Used  Substance and Sexual Activity  . Alcohol use: Yes    Comment:  40 ounce a week  . Drug use: Yes    Types: Marijuana    Comment: 01/06/18 - last time was 1 week ago  . Sexual activity: Not on file  Lifestyle  . Physical activity:    Days per week: Not on file    Minutes per session: Not on file  . Stress: Not on file  Relationships  . Social connections:    Talks on phone: Not on file    Gets  together: Not on file    Attends religious service: Not on file    Active member of club or organization: Not on file    Attends meetings of clubs or organizations: Not on file    Relationship status: Not on file  . Intimate partner violence:    Fear of current or ex partner: Not on file    Emotionally abused: Not on file    Physically abused: Not on file    Forced sexual activity: Not on file  Other Topics Concern  . Not on file  Social History Narrative  . Not on file     Review of Systems: General: negative for chills, fever, night sweats or weight changes.  Cardiovascular: negative for chest pain, dyspnea on exertion, edema, orthopnea, palpitations, paroxysmal nocturnal dyspnea or shortness of breath Dermatological: negative for rash Respiratory: negative for cough or wheezing Urologic: negative for hematuria Abdominal: negative for nausea, vomiting, diarrhea, bright red blood per rectum, melena, or hematemesis Neurologic: negative for visual changes, syncope, or dizziness All other systems reviewed and are otherwise negative except as noted above.    Blood pressure 122/82, pulse 92, height 5\' 8"  (1.727 m), weight 124 lb 12.8 oz (56.6 kg).  General appearance: alert and no distress Neck: no adenopathy, no carotid bruit, no JVD, supple, symmetrical, trachea midline and thyroid not enlarged, symmetric, no tenderness/mass/nodules Lungs: clear to auscultation bilaterally Heart: regular rate and rhythm, S1, S2 normal, no murmur, click, rub or gallop Extremities: extremities normal, atraumatic, no cyanosis or edema Pulses: 2+ and symmetric Skin: Skin color, texture, turgor normal. No rashes or lesions Neurologic: Alert and oriented X 3, normal strength and tone. Normal symmetric reflexes. Normal coordination and gait  EKG not performed today  ASSESSMENT AND PLAN:   Essential hypertension History of essential hypertension with blood pressure measured today at 122/82.  He is on  hydrochlorothiazide.  Continue current meds at current dosing.  Hyperlipidemia History of hyperlipidemia on statin therapy with lipid profile performed 12/29/2017 revealing LDL of 18 and HDL 46.  Tobacco abuse History of tobacco abuse now smoking 1/2 pack/day down from 1 pack/day with the intent to stop  PAD (peripheral artery disease) (HCC) History of PAD with lifestyle limiting claudication.  He underwent Dynabac orbital rotational atherectomy, VBX covered stenting of the calcified left proximal common iliac artery with nitinol self-expanding stenting of the left external iliac artery on 12/04/2017.  He did have high-grade calcified disease of the right common and external iliac artery as well as occlusion of his right common femoral artery.  He underwent common femoral endarterectomy with patch angioplasty as well as diamondback orbital rotational atherectomy and stenting of his right iliac system.  His claudication is markedly improved.  His surgical incision is well-healed.  He does have residual 90% calcified focal mid right SFA and popliteal stenosis which are percutaneously addressable in the future.  He is on aspirin and Plavix.      Nathan GessJonathan J. Matei Magnone MD FACP,FACC,FAHA, [facility] 03/25/2018 9:18 AM

## 2018-03-25 NOTE — Patient Instructions (Addendum)
Medication Instructions:  Your physician recommends that you continue on your current medications as directed. Please refer to the Current Medication list given to you today.  If you need a refill on your cardiac medications before your next appointment, please call your pharmacy.   Lab work: NONE If you have labs (blood work) drawn today and your tests are completely normal, you will receive your results only by: Marland Kitchen. MyChart Message (if you have MyChart) OR . A paper copy in the mail If you have any lab test that is abnormal or we need to change your treatment, we will call you to review the results.  Testing/Procedures:  Your physician has requested that you have a lower extremity arterial duplex. This test is an ultrasound of the arteries in the legs. It looks at arterial blood flow in the legs. Allow one hour for Lower and Upper Arterial scans. There are no restrictions or special instructions  SCHEDULE June 2020  Your physician has requested that you have a lower extremity arterial duplex. During this test, ultrasound IS used to evaluate arterial blood flow in the legs. Allow one hour for this exam. There are no restrictions or special instructions.  SCHEDULE June 2020  Your physician has requested that you have an ankle brachial index (ABI). During this test an ultrasound and blood pressure cuff are used to evaluate the arteries that supply the arms and legs with blood. Allow thirty minutes for this exam. There are no restrictions or special instructions.  SCHEDULE June 2020  Your physician has requested that you have Aorta/IVC/Iliac testing.  SCHEDULE June 2020  Follow-Up: At [facility], you and your health needs are our priority.  As part of our continuing mission to provide you with exceptional heart care, we have created designated Provider Care Teams.  These Care Teams include your primary Cardiologist (physician) and Advanced Practice Providers (APPs -  Physician  Assistants and Nurse Practitioners) who all work together to provide you with the care you need, when you need it. You will need a follow up appointment in 6 months.  Please call our office 2 months in advance to schedule this appointment.  You may see Nanetta BattyJonathan Berry, MD or one of the following Advanced Practice Providers on your designated Care Team:   Corine ShelterLuke Kilroy, PA-C Judy PimpleKrista Kroeger, [place] JerseyPA-C . Marjie Skiffallie Goodrich, PA-C

## 2018-09-23 ENCOUNTER — Ambulatory Visit (HOSPITAL_COMMUNITY)
Admission: RE | Admit: 2018-09-23 | Payer: No Typology Code available for payment source | Source: Ambulatory Visit | Attending: Cardiovascular Disease | Admitting: Cardiovascular Disease

## 2018-09-23 ENCOUNTER — Inpatient Hospital Stay (HOSPITAL_COMMUNITY)
Admission: RE | Admit: 2018-09-23 | Payer: No Typology Code available for payment source | Source: Ambulatory Visit | Attending: Cardiovascular Disease | Admitting: Cardiovascular Disease

## 2018-09-23 ENCOUNTER — Encounter (HOSPITAL_COMMUNITY): Payer: No Typology Code available for payment source

## 2018-10-21 ENCOUNTER — Encounter (HOSPITAL_COMMUNITY): Payer: Self-pay

## 2018-10-21 ENCOUNTER — Ambulatory Visit (HOSPITAL_COMMUNITY)
Admission: RE | Admit: 2018-10-21 | Payer: No Typology Code available for payment source | Source: Ambulatory Visit | Attending: Cardiovascular Disease | Admitting: Cardiovascular Disease

## 2018-10-30 ENCOUNTER — Telehealth: Payer: Self-pay

## 2018-10-30 NOTE — Telephone Encounter (Signed)
    COVID-19 Pre-Screening Questions:  . In the past 7 to 10 days have you had a cough,  shortness of breath, headache, congestion, fever (100 or greater) body aches, chills, sore throat, or sudden loss of taste or sense of smell? NO . Have you been around anyone with known Covid 19? NO . Have you been around anyone who is awaiting Covid 19 test results in the past 7 to 10 days? NO . Have you been around anyone who has been exposed to Covid 19, or has mentioned symptoms of Covid 19 within the past 7 to 10 days? NO  If you have any concerns/questions about symptoms patients report during screening (either on the phone or at threshold). Contact the provider seeing the patient or DOD for further guidance.  If neither are available contact a member of the leadership team.      PT AWARE TO WEAR MASK AND OF GUEST RESTRICTIONS AND VERBALIZED UNDERSTANDING

## 2018-11-04 ENCOUNTER — Telehealth: Payer: No Typology Code available for payment source | Admitting: Cardiovascular Disease

## 2018-11-11 ENCOUNTER — Ambulatory Visit (HOSPITAL_COMMUNITY)
Admission: RE | Admit: 2018-11-11 | Discharge: 2018-11-11 | Disposition: A | Discharge status: Home / Self Care | Payer: No Typology Code available for payment source | Source: Ambulatory Visit | Attending: Internal Medicine | Admitting: Internal Medicine

## 2018-11-11 ENCOUNTER — Ambulatory Visit (HOSPITAL_BASED_OUTPATIENT_CLINIC_OR_DEPARTMENT_OTHER)
Admission: RE | Admit: 2018-11-11 | Discharge: 2018-11-11 | Disposition: A | Discharge status: Home / Self Care | Payer: No Typology Code available for payment source | Source: Ambulatory Visit | Attending: Internal Medicine | Admitting: Internal Medicine

## 2018-11-11 ENCOUNTER — Other Ambulatory Visit: Payer: Self-pay

## 2018-11-11 ENCOUNTER — Other Ambulatory Visit (HOSPITAL_COMMUNITY): Payer: Self-pay | Admitting: Cardiovascular Disease

## 2018-11-11 DIAGNOSIS — Z95828 Presence of other vascular implants and grafts: Secondary | ICD-10-CM

## 2018-11-11 DIAGNOSIS — I739 Peripheral vascular disease, unspecified: Secondary | ICD-10-CM

## 2018-11-24 ENCOUNTER — Encounter: Payer: Self-pay | Admitting: Cardiovascular Disease

## 2018-11-24 ENCOUNTER — Telehealth: Payer: Self-pay

## 2018-11-24 ENCOUNTER — Other Ambulatory Visit: Payer: Self-pay

## 2018-11-24 ENCOUNTER — Ambulatory Visit (INDEPENDENT_AMBULATORY_CARE_PROVIDER_SITE_OTHER): Payer: No Typology Code available for payment source | Admitting: Cardiovascular Disease

## 2018-11-24 DIAGNOSIS — I739 Peripheral vascular disease, unspecified: Secondary | ICD-10-CM

## 2018-11-24 DIAGNOSIS — Z720 Tobacco use: Secondary | ICD-10-CM

## 2018-11-24 DIAGNOSIS — I10 Essential (primary) hypertension: Secondary | ICD-10-CM

## 2018-11-24 DIAGNOSIS — E782 Mixed hyperlipidemia: Secondary | ICD-10-CM

## 2018-11-24 NOTE — Assessment & Plan Note (Signed)
History of peripheral arterial disease status post diamondback orbital rotational atherectomy, PTA and covered stenting of the left common and external iliac artery by myself 12/04/2017 with three-vessel disease on that side.  He did have subtotally calcified occluded right common and external iliac artery stenosis as well as an occluded right common femoral artery with high-grade focal disease in the mid and distal right SFA.  I referred him to Dr. Trula Slade who performed right common femoral endarterectomy with patch angioplasty, orbital atherectomy and stenting of the right common iliac artery and external iliac artery.  His most recent Dopplers performed 11/11/2018 revealed the stents to be widely patent.  He remains on dual antiplatelet therapy and he has asymptomatic and able to walk without limitation.

## 2018-11-24 NOTE — Telephone Encounter (Signed)
Contacted department of veterans affairs at [phone] EXT. [id number] regarding [place] to inquire about reason for form. Will have form completed and signed and faxed back to [phone]        Randal Buba NUMBER: [id number]  REFERRAL NUMBER: [id number]

## 2018-11-24 NOTE — Patient Instructions (Addendum)
Medication Instructions:  Your physician recommends that you continue on your current medications as directed. Please refer to the Current Medication list given to you today.  If you need a refill on your cardiac medications before your next appointment, please call your pharmacy.   Lab work: NONE If you have labs (blood work) drawn today and your tests are completely normal, you will receive your results only by: Marland Kitchen MyChart Message (if you have MyChart) OR . A paper copy in the mail If you have any lab test that is abnormal or we need to change your treatment, we will call you to review the results.  Testing/Procedures: Your physician has requested that you have an aorta/iliac duplex. During this test, an ultrasound is used to evaluate blood flow to the aorta and iliac arteries. Allow one hour for this exam. Do not eat after midnight the day before and avoid carbonated beverages. TO BE SCHEDULED FOR 12 MONTHS  Your physician has requested that you have an ankle brachial index (ABI). During this test an ultrasound and blood pressure cuff are used to evaluate the arteries that supply the arms and legs with blood. Allow thirty minutes for this exam. There are no restrictions or special instructions. TO BE SCHEDULED FOR 12 MONTHS  Follow-Up: At [facility], you and your health needs are our priority.  As part of our continuing mission to provide you with exceptional heart care, we have created designated Provider Care Teams.  These Care Teams include your primary Cardiologist (physician) and Advanced Practice Providers (APPs -  Physician Assistants and Nurse Practitioners) who all work together to provide you with the care you need, when you need it. You will need a follow up appointment in 12 months WITH DR. Gwenlyn Found.  Please call our office 2 months in advance to schedule this appointment.

## 2018-11-24 NOTE — Assessment & Plan Note (Signed)
History tobacco abuse smoking 1 pack a day in the past now 1 pack/week with the desire to stop.  Currently he is getting hypnosis therapy.

## 2018-11-24 NOTE — Progress Notes (Signed)
11/24/2018 Nathan SpillerWilliam R Bow   1950/07/26  [id number]  Primary Physician Borum, Vista MinkStephanie Y, MD Primary Cardiologist: Runell GessJonathan J Maclovia Uher MD Roseanne RenoFACP, FACC, FAHA, FSCAI  HPI:  Nathan Palmer is a 68 y.o.   thin appearing divorced African-American male father of 3, grandfather one grandchild [facility] of claudication.I last saw him in the office  03/25/2018. He has a history of treated hypertension, untreated mild hyperlipidemia and greater than 50 pack years of tobacco abuse currently smoking 1 pack/day. There is no family history of heart disease. Never had a heart attack or stroke and denies chest pain or shortness of breath. He has complained of right greater than left lower extremity lifestyle limiting claudication less than 1 block for the last year and was referred here for further evaluation of this. I performed peripheral angiography 12/04/2017 revealing bilateral iliac disease as well as right common femoral SFA and popliteal disease. I performed diamondback orbital rotational atherectomy, PTA and covered stenting (VBX) of his left common and external iliac artery. His ABIs did not change but his velocities improved. His claudication on the left has markedly improved as well.   He did have residual right common and external iliac artery calcified high-grade stenoses as well as an occluded right common femoral artery that was calcified, focal mid right SFA and popliteal artery stenosis.  I referred him to Dr. Myra GianottiBrabham who performed endarterectomy and patch angioplasty as well as diamondback orbital rotational atherectomy and stenting of his right common and external neck artery as well as his right common femoral artery with an excellent result.  His wound is well-healed.  His Dopplers have improved.  His claudication has resolved.  He is on aspirin Plavix.  He is reduced his smoking from 1 pack to 1/2 pack/day, and most recently to 1  pack/week.  Since I saw him 8 months ago he continues to improve.  He no longer has claudication.  He is taking his medications reliably and is trying to stop smoking.   Current Meds  Medication Sig  . aspirin 81 MG tablet Take 81 mg by mouth daily.  Marland Kitchen. atorvastatin (LIPITOR) 80 MG tablet Take 1 tablet (80 mg total) by mouth daily at 6 PM.  . clopidogrel (PLAVIX) 75 MG tablet Take 1 tablet (75 mg total) by mouth daily with breakfast.  . hydrochlorothiazide (HYDRODIURIL) 25 MG tablet Take 25 mg by mouth daily.  Marland Kitchen. latanoprost (XALATAN) 0.005 % ophthalmic solution Place 1 drop into both eyes at bedtime.  . Skin Protectants, Misc. (MINERIN) CREA Apply 1 application topically 2 (two) times daily as needed (itching).  . traMADol (ULTRAM) 50 MG tablet Take 1 tablet (50 mg total) by mouth every 6 (six) hours as needed (pain).  . [DISCONTINUED] gabapentin (NEURONTIN) 300 MG capsule Take 300 mg by mouth 2 (two) times daily.  . [DISCONTINUED] salsalate (DISALCID) 750 MG tablet Take 750 mg by mouth 2 (two) times daily.     No Known Allergies  Social History   Socioeconomic History  . Marital status: Divorced    Spouse name: Not on file  . Number of children: Not on file  . Years of education: Not on file  . Highest education level: Not on file  Occupational History  . Not on file  Social Needs  . Financial resource strain: Not on file  . Food insecurity    Worry: Not on file    Inability: Not on file  .  Transportation needs    Medical: Not on file    Non-medical: Not on file  Tobacco Use  . Smoking status: Current Every Day Smoker    Packs/day: 0.25    Years: 40.00    Pack years: 10.00    Types: Cigarettes    Last attempt to quit: 12/04/2017    Years since quitting: 0.9  . Smokeless tobacco: Never Used  Substance and Sexual Activity  . Alcohol use: Yes    Comment:  40 ounce a week  . Drug use: Yes    Types: Marijuana    Comment: 01/06/18 - last time was 1 week ago  . Sexual  activity: Not on file  Lifestyle  . Physical activity    Days per week: Not on file    Minutes per session: Not on file  . Stress: Not on file  Relationships  . Social Musicianconnections    Talks on phone: Not on file    Gets together: Not on file    Attends religious service: Not on file    Active member of club or organization: Not on file    Attends meetings of clubs or organizations: Not on file    Relationship status: Not on file  . Intimate partner violence    Fear of current or ex partner: Not on file    Emotionally abused: Not on file    Physically abused: Not on file    Forced sexual activity: Not on file  Other Topics Concern  . Not on file  Social History Narrative  . Not on file     Review of Systems: General: negative for chills, fever, night sweats or weight changes.  Cardiovascular: negative for chest pain, dyspnea on exertion, edema, orthopnea, palpitations, paroxysmal nocturnal dyspnea or shortness of breath Dermatological: negative for rash Respiratory: negative for cough or wheezing Urologic: negative for hematuria Abdominal: negative for nausea, vomiting, diarrhea, bright red blood per rectum, melena, or hematemesis Neurologic: negative for visual changes, syncope, or dizziness All other systems reviewed and are otherwise negative except as noted above.    Blood pressure 98/64, pulse 65, temperature 97.7 F (36.5 C), height 5\' 8"  (1.727 m), weight 124 lb (56.2 kg).  General appearance: alert and no distress Neck: no adenopathy, no carotid bruit, no JVD, supple, symmetrical, trachea midline and thyroid not enlarged, symmetric, no tenderness/mass/nodules Lungs: clear to auscultation bilaterally Heart: regular rate and rhythm, S1, S2 normal, no murmur, click, rub or gallop  EKG sinus rhythm at 65 with septal Q waves.  I personally reviewed this EKG.  ASSESSMENT AND PLAN:   Claudication in peripheral vascular disease (HCC) History of peripheral arterial  disease status post diamondback orbital rotational atherectomy, PTA and covered stenting of the left common and external iliac artery by myself 12/04/2017 with three-vessel disease on that side.  He did have subtotally calcified occluded right common and external iliac artery stenosis as well as an occluded right common femoral artery with high-grade focal disease in the mid and distal right SFA.  I referred him to Dr. Myra GianottiBrabham who performed right common femoral endarterectomy with patch angioplasty, orbital atherectomy and stenting of the right common iliac artery and external iliac artery.  His most recent Dopplers performed 11/11/2018 revealed the stents to be widely patent.  He remains on dual antiplatelet therapy and he has asymptomatic and able to walk without limitation.  Essential hypertension History of essential hypertension with blood pressure measured today at 98/64.  He is on hydrochlorothiazide  Hyperlipidemia History of hyperlipidemia on statin therapy with lipid profile performed 12/29/2017 revealing total cholesterol of 111, LDL of 18 and HDL 46  Tobacco abuse History tobacco abuse smoking 1 pack a day in the past now 1 pack/week with the desire to stop.  Currently he is getting hypnosis therapy.      Lorretta Harp MD FACP,FACC,FAHA, [facility] 11/24/2018 10:37 AM

## 2018-11-24 NOTE — Assessment & Plan Note (Addendum)
History of hyperlipidemia on statin therapy with lipid profile performed 12/29/2017 revealing total cholesterol of 111, LDL of 18 and HDL 46

## 2018-11-24 NOTE — Assessment & Plan Note (Signed)
History of essential hypertension with blood pressure measured today at 98/64.  He is on hydrochlorothiazide

## 2019-11-24 ENCOUNTER — Ambulatory Visit (HOSPITAL_BASED_OUTPATIENT_CLINIC_OR_DEPARTMENT_OTHER)
Admission: RE | Admit: 2019-11-24 | Discharge: 2019-11-24 | Disposition: A | Discharge status: Home / Self Care | Payer: No Typology Code available for payment source | Source: Ambulatory Visit | Attending: Cardiovascular Disease | Admitting: Cardiovascular Disease

## 2019-11-24 ENCOUNTER — Other Ambulatory Visit: Payer: Self-pay

## 2019-11-24 ENCOUNTER — Ambulatory Visit (HOSPITAL_COMMUNITY)
Admission: RE | Admit: 2019-11-24 | Discharge: 2019-11-24 | Disposition: A | Discharge status: Home / Self Care | Payer: No Typology Code available for payment source | Source: Ambulatory Visit | Attending: Cardiology | Admitting: Cardiology

## 2019-11-24 DIAGNOSIS — Z95828 Presence of other vascular implants and grafts: Secondary | ICD-10-CM

## 2019-11-24 DIAGNOSIS — I739 Peripheral vascular disease, unspecified: Secondary | ICD-10-CM | POA: Insufficient documentation

## 2019-11-30 ENCOUNTER — Other Ambulatory Visit: Payer: Self-pay

## 2019-11-30 ENCOUNTER — Encounter: Payer: Self-pay | Admitting: Cardiovascular Disease

## 2019-11-30 ENCOUNTER — Ambulatory Visit (INDEPENDENT_AMBULATORY_CARE_PROVIDER_SITE_OTHER): Payer: No Typology Code available for payment source | Admitting: Cardiovascular Disease

## 2019-11-30 VITALS — BP 86/62 | HR 86 | Ht 67.5 in | Wt 126.6 lb

## 2019-11-30 DIAGNOSIS — I739 Peripheral vascular disease, unspecified: Secondary | ICD-10-CM

## 2019-11-30 DIAGNOSIS — Z720 Tobacco use: Secondary | ICD-10-CM

## 2019-11-30 DIAGNOSIS — E782 Mixed hyperlipidemia: Secondary | ICD-10-CM

## 2019-11-30 DIAGNOSIS — I10 Essential (primary) hypertension: Secondary | ICD-10-CM

## 2019-11-30 DIAGNOSIS — Z0000 Encounter for general adult medical examination without abnormal findings: Secondary | ICD-10-CM

## 2019-11-30 NOTE — Assessment & Plan Note (Signed)
Nathan Palmer returns today for follow-up.  Orbital atherectomy, PTA and stenting of his left common and external iliac artery by myself 12/04/2017.  I did refer him to Dr. Myra Gianotti who performed right common femoral endarterectomy and patch angioplasty along with atherectomy and stenting of his right common iliac artery.  His velocities improved and his claudication did as well.  His ABIs are noncompressible.  He did have high-grade calcified mid and distal right SFA stenosis.  He apparently is scheduled to have a bone graft on the left but not on the right because of diminished blood flow.  You may get lower extremity arterial Doppler studies to further evaluate.

## 2019-11-30 NOTE — Progress Notes (Signed)
11/30/2019 MURDOCK JELLISON   Nov 28, 1950  [id number]  Primary Physician Borum, Vista Mink, MD Primary Cardiologist: Runell Gess MD [name]  HPI:  Nathan Palmer is a 69 y.o.  thin appearing divorced African-American male father of 3, grandfather one grandchild referred by the [facility] for evaluation of claudication.I last saw him in the office  11/24/2018. He has a history of treated hypertension, untreated mild hyperlipidemia and greater than 50 pack years of tobacco abuse currently smoking 1 pack/day. There is no family history of heart disease. Never had a heart attack or stroke and denies chest pain or shortness of breath. He has complained of right greater than left lower extremity lifestyle limiting claudication less than 1 block for the last year and was referred here for further evaluation of this. I performed peripheral angiography 12/04/2017 revealing bilateral iliac disease as well as right common femoral SFA and popliteal disease. I performeddiamondbackorbital rotational atherectomy, PTA and coveredstenting (VBX)of his left common and external iliac artery. His ABIs did not change but his velocities improved. His claudication on the left has markedly improved as well.He did have residual right common and external iliac artery calcified high-grade stenoses as well as an occluded right common femoral artery that was calcified, focal mid right SFA and popliteal artery stenosis.  I referred him to Dr. Myra Gianotti who performed endarterectomy and patch angioplasty as well as diamondback orbital rotational atherectomy and stenting of his right common and external neck artery as well as his right common femoral artery with an excellent result. His wound is well-healed. His Dopplers have improved. His claudication has resolved. He is on aspirin Plavix. He is reduced his smoking from 1 pack to 1/2 pack/day, and most recently to 1  pack/week.  Since I saw him a year ago he continues to do well.  Recent Dopplers continue to show patent iliac stents.  Denies claudication, chest pain or shortness of breath.  Is continue to smoke 2 packs/week.  He remains on dual antiplatelet therapy..    Current Meds  Medication Sig  . aspirin 81 MG tablet Take 81 mg by mouth daily.  Marland Kitchen atorvastatin (LIPITOR) 80 MG tablet Take 1 tablet (80 mg total) by mouth daily at 6 PM.  . clopidogrel (PLAVIX) 75 MG tablet Take 1 tablet (75 mg total) by mouth daily with breakfast.  . hydrochlorothiazide (HYDRODIURIL) 25 MG tablet Take 25 mg by mouth daily.  Marland Kitchen latanoprost (XALATAN) 0.005 % ophthalmic solution Place 1 drop into both eyes at bedtime.  . Skin Protectants, Misc. (MINERIN) CREA Apply 1 application topically 2 (two) times daily as needed (itching).  . traMADol (ULTRAM) 50 MG tablet Take 1 tablet (50 mg total) by mouth every 6 (six) hours as needed (pain).     No Known Allergies  Social History   Socioeconomic History  . Marital status: Divorced    Spouse name: Not on file  . Number of children: Not on file  . Years of education: Not on file  . Highest education level: Not on file  Occupational History  . Not on file  Tobacco Use  . Smoking status: Current Every Day Smoker    Packs/day: 0.25    Years: 40.00    Pack years: 10.00    Types: Cigarettes    Last attempt to quit: 12/04/2017    Years since quitting: 1.9  . Smokeless tobacco: Never Used  Vaping Use  . Vaping Use: Never used  Substance and Sexual Activity  . Alcohol use: Yes    Comment:  40 ounce a week  . Drug use: Yes    Types: Marijuana    Comment: 01/06/18 - last time was 1 week ago  . Sexual activity: Not on file  Other Topics Concern  . Not on file  Social History Narrative  . Not on file   Social Determinants of Health   Financial Resource Strain:   . Difficulty of Paying Living Expenses:   Food Insecurity:   . Worried About Programme researcher, broadcasting/film/video in  the Last Year:   . Barista in the Last Year:   Transportation Needs:   . Freight forwarder (Medical):   Marland Kitchen Lack of Transportation (Non-Medical):   Physical Activity:   . Days of Exercise per Week:   . Minutes of Exercise per Session:   Stress:   . Feeling of Stress :   Social Connections:   . Frequency of Communication with Friends and Family:   . Frequency of Social Gatherings with Friends and Family:   . Attends Religious Services:   . Active Member of Clubs or Organizations:   . Attends Banker Meetings:   Marland Kitchen Marital Status:   Intimate Partner Violence:   . Fear of Current or Ex-Partner:   . Emotionally Abused:   Marland Kitchen Physically Abused:   . Sexually Abused:      Review of Systems: General: negative for chills, fever, night sweats or weight changes.  Cardiovascular: negative for chest pain, dyspnea on exertion, edema, orthopnea, palpitations, paroxysmal nocturnal dyspnea or shortness of breath Dermatological: negative for rash Respiratory: negative for cough or wheezing Urologic: negative for hematuria Abdominal: negative for nausea, vomiting, diarrhea, bright red blood per rectum, melena, or hematemesis Neurologic: negative for visual changes, syncope, or dizziness All other systems reviewed and are otherwise negative except as noted above.    Blood pressure (!) 86/62, pulse 86, height 5' 7.5" (1.715 m), weight 126 lb 9.6 oz (57.4 kg), SpO2 99 %.  General appearance: alert and no distress Neck: no adenopathy, no carotid bruit, no JVD, supple, symmetrical, trachea midline and thyroid not enlarged, symmetric, no tenderness/mass/nodules Lungs: clear to auscultation bilaterally Heart: regular rate and rhythm, S1, S2 normal, no murmur, click, rub or gallop Extremities: extremities normal, atraumatic, no cyanosis or edema Pulses: 2+ and symmetric Skin: Skin color, texture, turgor normal. No rashes or lesions Neurologic: Alert and oriented X 3, normal  strength and tone. Normal symmetric reflexes. Normal coordination and gait  EKG sinus rhythm 86 with septal Q waves.  I Personally reviewed this EKG.  ASSESSMENT AND PLAN:   Claudication in peripheral vascular disease [facility]) Mr. Spitler returns today for follow-up.  Orbital atherectomy, PTA and stenting of his left common and external iliac artery by myself 12/04/2017.  I did refer him to Dr. Myra Gianotti who performed right common femoral endarterectomy and patch angioplasty along with atherectomy and stenting of his right common iliac artery.  His velocities improved and his claudication did as well.  His ABIs are noncompressible.  He did have high-grade calcified mid and distal right SFA stenosis.  He apparently is scheduled to have a bone graft on the left but not on the right because of diminished blood flow.  You may get lower extremity arterial Doppler studies to further evaluate.  Essential hypertension History of essential hypertension a blood pressure measured today of 86/62.  He is on hydrochlorothiazide.  Tobacco abuse History of  tobacco abuse currently smoking 2 packs/week down from 1 pack/day with interest in smoking cessation.  Hyperlipidemia History of hyperlipidemia on statin therapy with lipid profile performed 12/29/2017 revealing total cholesterol 111, LDL of 18 and HDL 46.      Runell Gess MD [facility], [facility] 11/30/2019 2:13 PM

## 2019-11-30 NOTE — Assessment & Plan Note (Signed)
History of tobacco abuse currently smoking 2 packs/week down from 1 pack/day with interest in smoking cessation.

## 2019-11-30 NOTE — Patient Instructions (Addendum)
Medication Instructions:  The current medical regimen is effective;  continue present plan and medications.  *If you need a refill on your cardiac medications before your next appointment, please call your pharmacy*   Testing/Procedures: Your physician has requested that you have a lower or upper extremity arterial duplex. This test is an ultrasound of the arteries in the legs or arms. It looks at arterial blood flow in the legs and arms. Allow one hour for Lower and Upper Arterial scans. There are no restrictions or special instructions   Follow-Up: At [facility], you and your health needs are our priority.  As part of our continuing mission to provide you with exceptional heart care, we have created designated Provider Care Teams.  These Care Teams include your primary Cardiologist (physician) and Advanced Practice Providers (APPs -  Physician Assistants and Nurse Practitioners) who all work together to provide you with the care you need, when you need it.  We recommend signing up for the patient portal called "MyChart".  Sign up information is provided on this After Visit Summary.  MyChart is used to connect with patients for Virtual Visits (Telemedicine).  Patients are able to view lab/test results, encounter notes, upcoming appointments, etc.  Non-urgent messages can be sent to your provider as well.   To learn more about what you can do with MyChart, go to [email or web address].    Your next appointment:   12 month(s)  The format for your next appointment:   In Person  Provider:   Nanetta Batty, MD

## 2019-11-30 NOTE — Assessment & Plan Note (Signed)
History of essential hypertension a blood pressure measured today of 86/62.  He is on hydrochlorothiazide.

## 2019-11-30 NOTE — Assessment & Plan Note (Signed)
History of hyperlipidemia on statin therapy with lipid profile performed 12/29/2017 revealing total cholesterol 111, LDL of 18 and HDL 46.

## 2019-12-01 NOTE — Addendum Note (Signed)
Addended by: Daryll Brod on: 12/01/2019 10:21 AM   Modules accepted: Orders

## 2019-12-15 IMAGING — DX DG CHEST 2V
2 series · 2 of 2 positions shown · non-contrast
Comparison: None.

CLINICAL DATA: Preop stent placement.

EXAM:
CHEST - 2 VIEW

[chest pa]
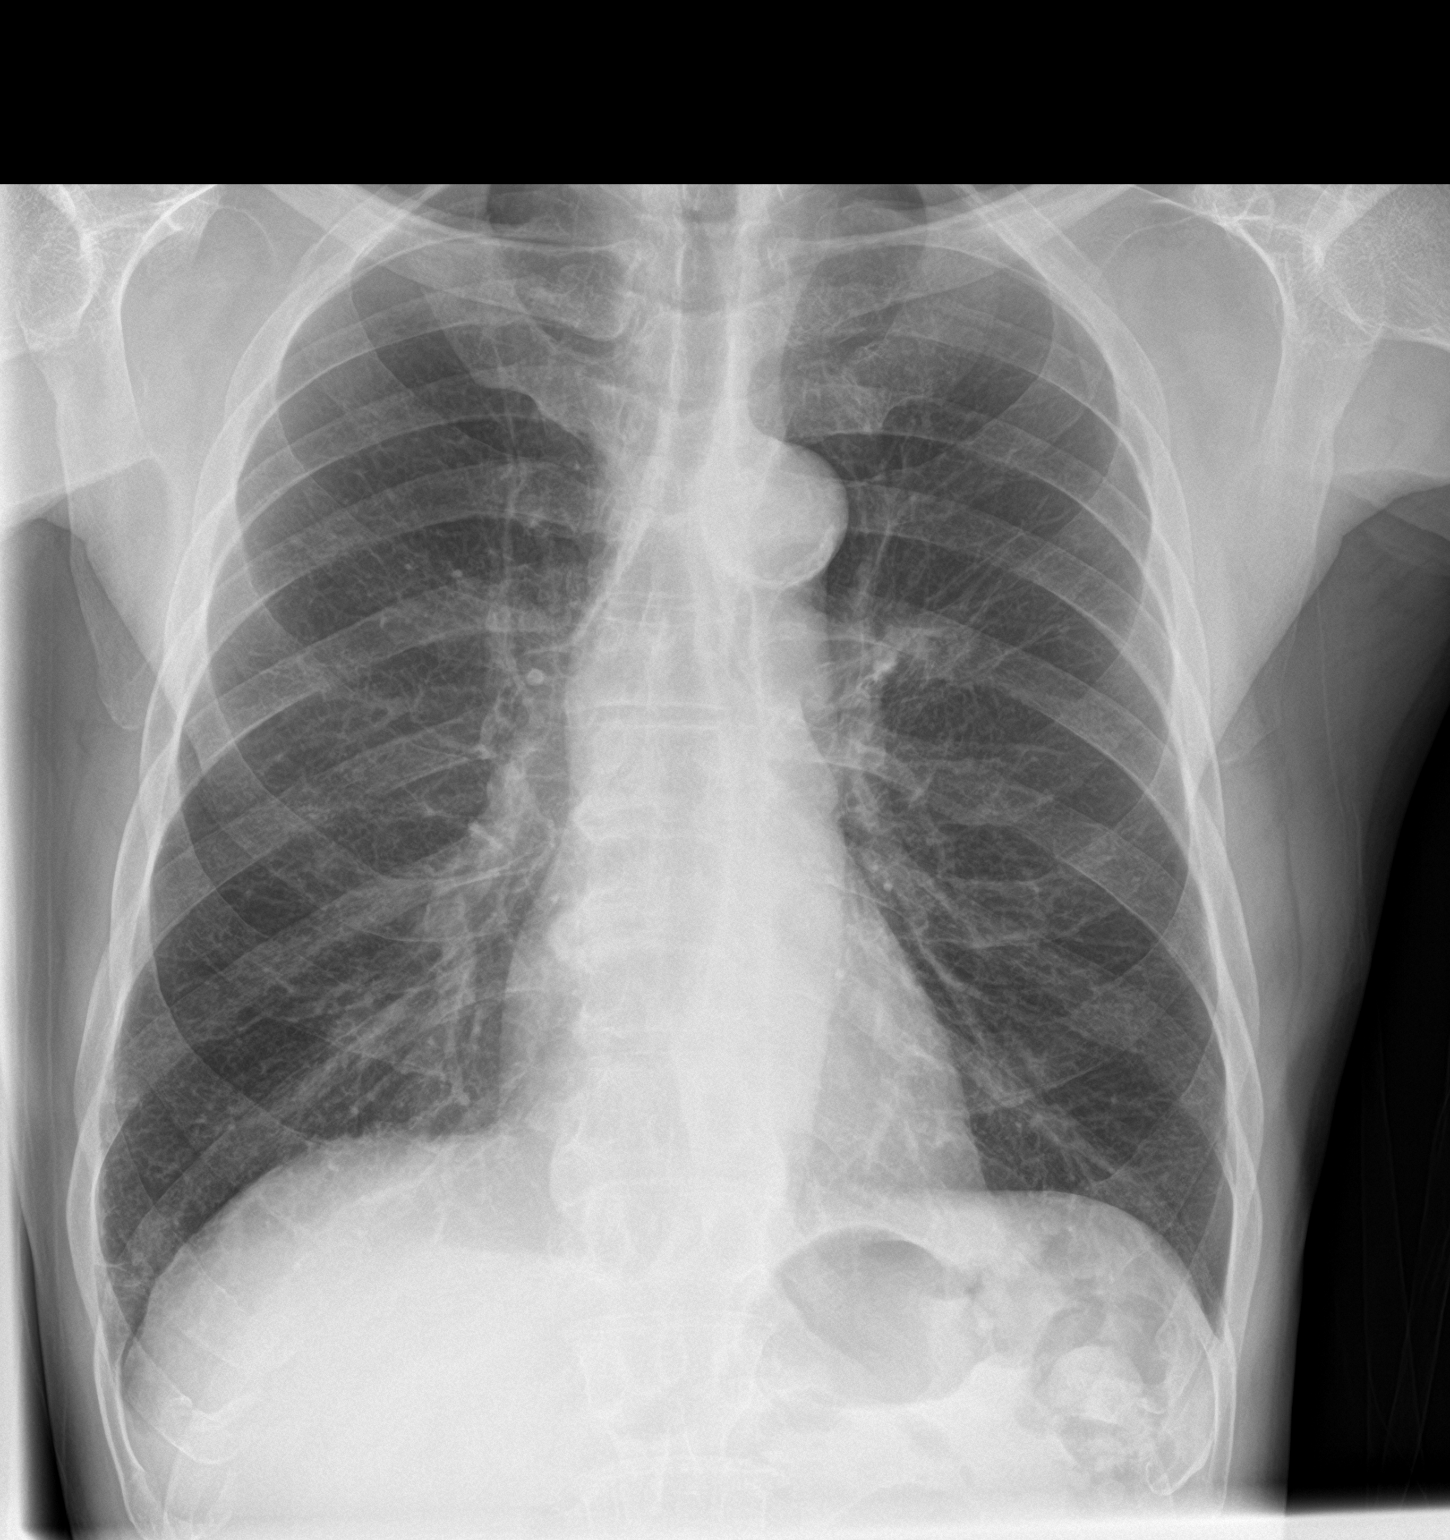

[chest lat]
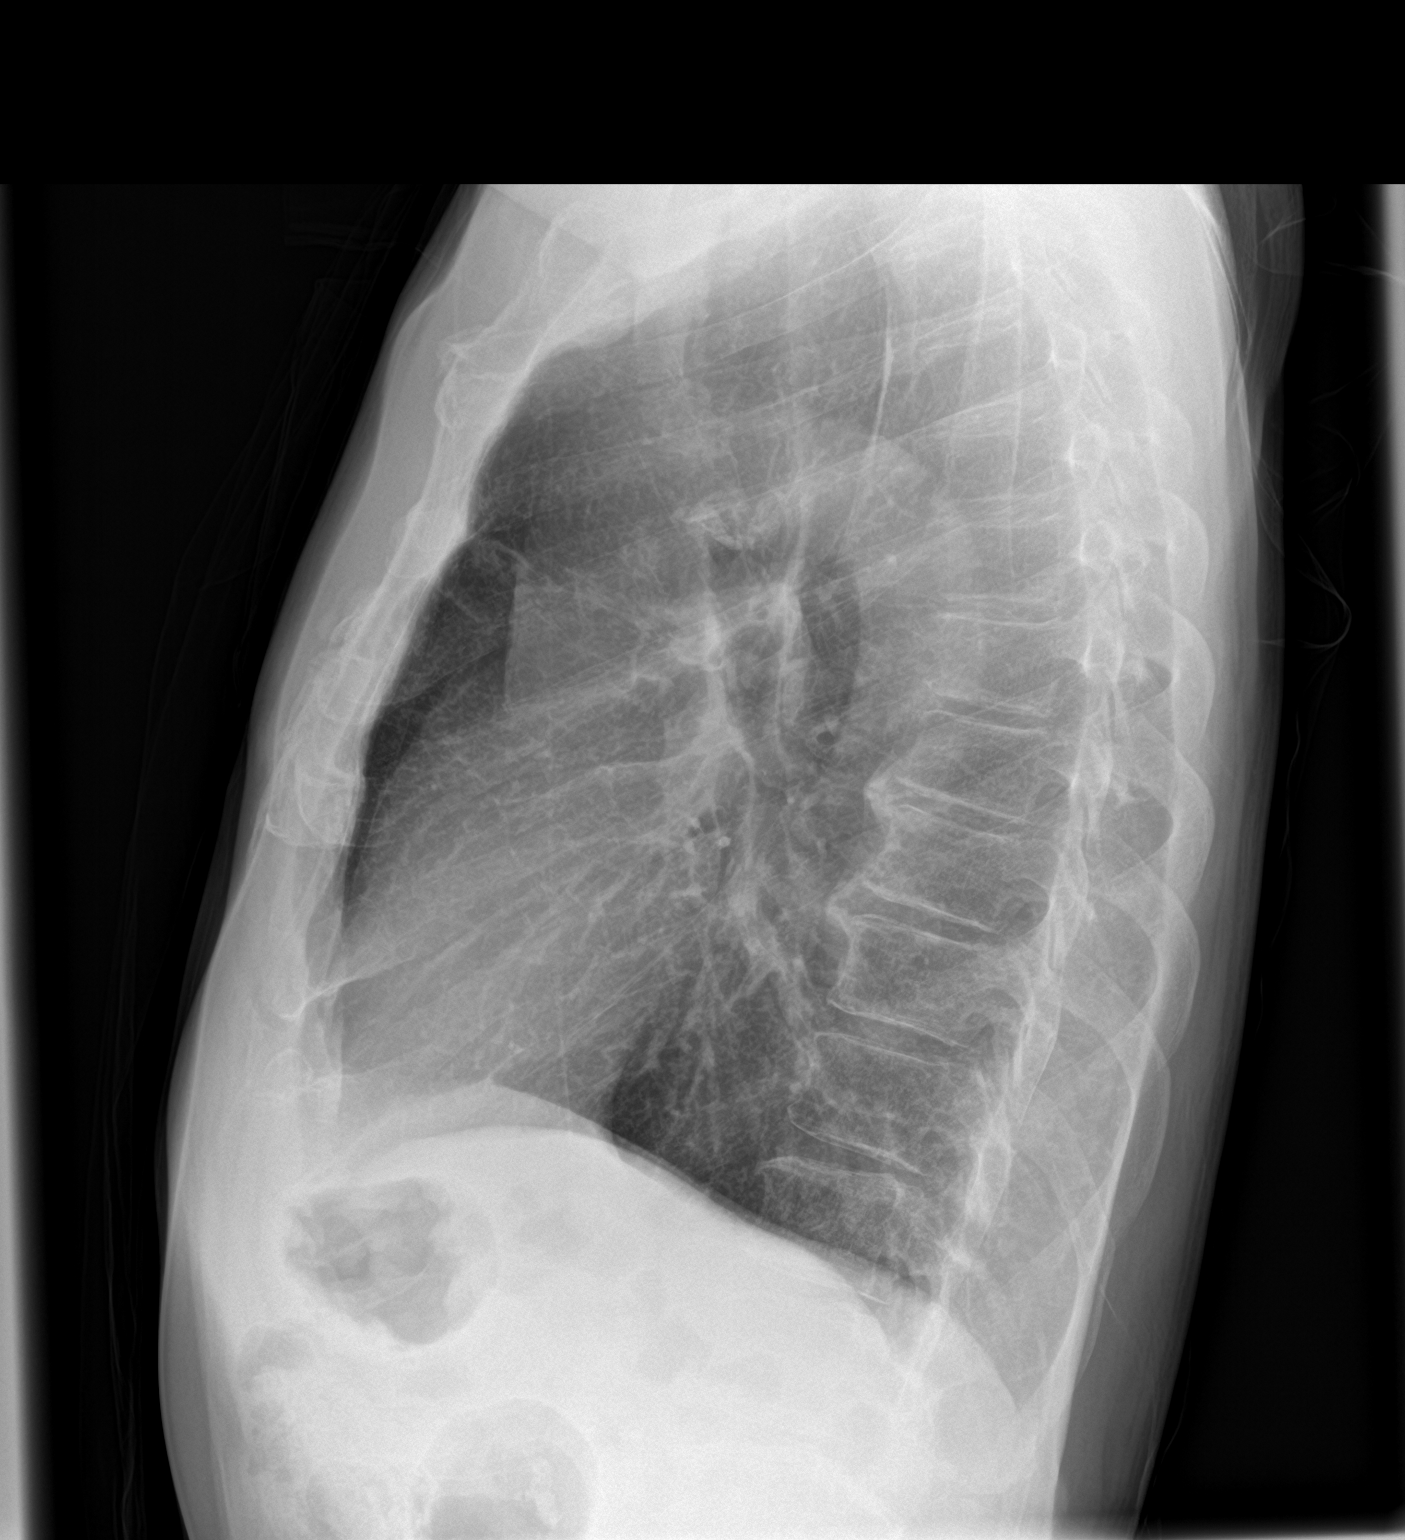

[2 of 2 positions shown; findings below may reference images not displayed]

FINDINGS: The heart size and mediastinal contours are within normal limits.
Both lungs are clear. The visualized skeletal structures are
unremarkable.
IMPRESSION: No active cardiopulmonary disease.

## 2019-12-24 ENCOUNTER — Ambulatory Visit (HOSPITAL_COMMUNITY)
Admission: RE | Admit: 2019-12-24 | Discharge: 2019-12-24 | Disposition: A | Discharge status: Home / Self Care | Payer: No Typology Code available for payment source | Source: Ambulatory Visit | Attending: Cardiology | Admitting: Cardiology

## 2019-12-24 ENCOUNTER — Other Ambulatory Visit: Payer: Self-pay

## 2019-12-24 DIAGNOSIS — I739 Peripheral vascular disease, unspecified: Secondary | ICD-10-CM | POA: Diagnosis present

## 2020-01-10 ENCOUNTER — Telehealth: Payer: Self-pay

## 2020-01-10 DIAGNOSIS — I739 Peripheral vascular disease, unspecified: Secondary | ICD-10-CM

## 2020-01-10 NOTE — Telephone Encounter (Signed)
Spoke to patient lower ext doppler results given.Advised to repeat in 12 months. 

## 2020-12-28 ENCOUNTER — Other Ambulatory Visit: Payer: Self-pay

## 2020-12-28 DIAGNOSIS — Z95828 Presence of other vascular implants and grafts: Secondary | ICD-10-CM

## 2020-12-28 DIAGNOSIS — I739 Peripheral vascular disease, unspecified: Secondary | ICD-10-CM

## 2021-01-01 ENCOUNTER — Encounter (HOSPITAL_COMMUNITY): Payer: No Typology Code available for payment source

## 2021-01-01 ENCOUNTER — Inpatient Hospital Stay (HOSPITAL_COMMUNITY): Admission: RE | Admit: 2021-01-01 | Payer: Self-pay | Source: Ambulatory Visit

## 2021-01-03 ENCOUNTER — Ambulatory Visit (HOSPITAL_COMMUNITY)
Admission: RE | Admit: 2021-01-03 | Payer: Self-pay | Source: Ambulatory Visit | Attending: Cardiovascular Disease | Admitting: Cardiovascular Disease

## 2021-01-05 ENCOUNTER — Other Ambulatory Visit: Payer: Self-pay | Admitting: Cardiovascular Disease

## 2021-01-05 DIAGNOSIS — I739 Peripheral vascular disease, unspecified: Secondary | ICD-10-CM

## 2021-01-05 DIAGNOSIS — Z95828 Presence of other vascular implants and grafts: Secondary | ICD-10-CM

## 2021-01-12 ENCOUNTER — Ambulatory Visit (HOSPITAL_BASED_OUTPATIENT_CLINIC_OR_DEPARTMENT_OTHER)
Admission: RE | Admit: 2021-01-12 | Discharge: 2021-01-12 | Disposition: A | Discharge status: Home / Self Care | Payer: No Typology Code available for payment source | Source: Ambulatory Visit | Attending: Cardiology | Admitting: Cardiology

## 2021-01-12 ENCOUNTER — Ambulatory Visit (HOSPITAL_COMMUNITY)
Admission: RE | Admit: 2021-01-12 | Discharge: 2021-01-12 | Disposition: A | Discharge status: Home / Self Care | Payer: No Typology Code available for payment source | Source: Ambulatory Visit | Attending: Cardiovascular Disease | Admitting: Cardiovascular Disease

## 2021-01-12 ENCOUNTER — Other Ambulatory Visit (HOSPITAL_COMMUNITY): Payer: Self-pay | Admitting: Cardiovascular Disease

## 2021-01-12 ENCOUNTER — Other Ambulatory Visit: Payer: Self-pay

## 2021-01-12 DIAGNOSIS — I739 Peripheral vascular disease, unspecified: Secondary | ICD-10-CM | POA: Diagnosis not present

## 2021-01-12 DIAGNOSIS — Z95828 Presence of other vascular implants and grafts: Secondary | ICD-10-CM | POA: Insufficient documentation

## 2021-03-19 ENCOUNTER — Other Ambulatory Visit: Payer: Self-pay

## 2021-03-19 ENCOUNTER — Encounter (HOSPITAL_COMMUNITY): Payer: Self-pay

## 2021-03-19 ENCOUNTER — Emergency Department (HOSPITAL_COMMUNITY)
Admission: EM | Admit: 2021-03-19 | Discharge: 2021-03-19 | Disposition: A | Discharge status: Home / Self Care | Payer: No Typology Code available for payment source | Attending: Emergency Medicine | Admitting: Emergency Medicine

## 2021-03-19 DIAGNOSIS — Z79899 Other long term (current) drug therapy: Secondary | ICD-10-CM | POA: Diagnosis not present

## 2021-03-19 DIAGNOSIS — F17210 Nicotine dependence, cigarettes, uncomplicated: Secondary | ICD-10-CM | POA: Diagnosis not present

## 2021-03-19 DIAGNOSIS — I10 Essential (primary) hypertension: Secondary | ICD-10-CM | POA: Insufficient documentation

## 2021-03-19 DIAGNOSIS — K6289 Other specified diseases of anus and rectum: Secondary | ICD-10-CM | POA: Diagnosis not present

## 2021-03-19 DIAGNOSIS — K5900 Constipation, unspecified: Secondary | ICD-10-CM | POA: Diagnosis present

## 2021-03-19 DIAGNOSIS — Z7982 Long term (current) use of aspirin: Secondary | ICD-10-CM | POA: Diagnosis not present

## 2021-03-19 LAB — CBC WITH DIFFERENTIAL/PLATELET
Abs Immature Granulocytes: 0.01 10*3/uL (ref 0.00–0.07)
Basophils Absolute: 0.1 10*3/uL (ref 0.0–0.1)
Basophils Relative: 1 %
Eosinophils Absolute: 0.3 10*3/uL (ref 0.0–0.5)
Eosinophils Relative: 4 %
HCT: 40.3 % (ref 39.0–52.0)
Hemoglobin: 13.4 g/dL (ref 13.0–17.0)
Immature Granulocytes: 0 %
Lymphocytes Relative: 20 %
Lymphs Abs: 1.3 10*3/uL (ref 0.7–4.0)
MCH: 33 pg (ref 26.0–34.0)
MCHC: 33.3 g/dL (ref 30.0–36.0)
MCV: 99.3 fL (ref 80.0–100.0)
Monocytes Absolute: 0.7 10*3/uL (ref 0.1–1.0)
Monocytes Relative: 11 %
Neutro Abs: 4 10*3/uL (ref 1.7–7.7)
Neutrophils Relative %: 64 %
Platelets: 200 10*3/uL (ref 150–400)
RBC: 4.06 MIL/uL — ABNORMAL LOW (ref 4.22–5.81)
RDW: 13.2 % (ref 11.5–15.5)
WBC: 6.3 10*3/uL (ref 4.0–10.5)
nRBC: 0 % (ref 0.0–0.2)

## 2021-03-19 LAB — COMPREHENSIVE METABOLIC PANEL
ALT: 18 U/L (ref 0–44)
AST: 29 U/L (ref 15–41)
Albumin: 4.8 g/dL (ref 3.5–5.0)
Alkaline Phosphatase: 85 U/L (ref 38–126)
Anion gap: 8 (ref 5–15)
BUN: 18 mg/dL (ref 8–23)
CO2: 22 mmol/L (ref 22–32)
Calcium: 8.9 mg/dL (ref 8.9–10.3)
Chloride: 102 mmol/L (ref 98–111)
Creatinine, Ser: 1.04 mg/dL (ref 0.61–1.24)
GFR, Estimated: >60 mL/min (ref >60)
Glucose, Bld: 101 mg/dL — ABNORMAL HIGH (ref 70–99)
Potassium: 3.7 mmol/L (ref 3.5–5.1)
Sodium: 132 mmol/L — ABNORMAL LOW (ref 135–145)
Total Bilirubin: 0.8 mg/dL (ref 0.3–1.2)
Total Protein: 8.1 g/dL (ref 6.5–8.1)

## 2021-03-19 MED ORDER — POLYETHYLENE GLYCOL 3350 17 GM/SCOOP PO POWD: 17.0000 g | Freq: Every day | ORAL | 0 refills | Status: DC

## 2021-03-19 MED ORDER — FLEET ENEMA 7-19 GM/118ML RE ENEM
1.0000 | ENEMA | Freq: Once | RECTAL | Status: AC
  Administered 2021-03-19: 1 via RECTAL
  Filled 2021-03-19: qty 1

## 2021-03-19 NOTE — ED Provider Notes (Signed)
Emergency Medicine Provider Triage Evaluation Note  Nathan Palmer , a 70 y.o. male  was evaluated in triage.  Pt complains of constipation.  He presents to the ED by EMS for evaluation of constipation.  He feels like there is something there to push out but unable to pass it.  He has tried half a bottle of laxative without improvement.    Review of Systems  Positive: Rectal pain, constipatin Negative: Fever, vomiting, abdominal pain  Physical Exam  BP 124/81 (BP Location: Left Arm)   Pulse 75   Temp 99 F (37.2 C) (Oral)   Resp 18   SpO2 100%  Gen:   Awake, no distress   Resp:  Normal effort  MSK:   Moves extremities without difficulty      Medical Decision Making  Medically screening exam initiated at 4:31 AM.  Appropriate orders placed.  [name] was informed that the remainder of the evaluation will be completed by another provider, this initial triage assessment does not replace that evaluation, and the importance of remaining in the ED until their evaluation is complete.     Tilden Fossa, MD 03/19/21 [phone]

## 2021-03-19 NOTE — Discharge Instructions (Addendum)
Take the medications as prescribed to help prevent constipation.  Follow-up with your doctor as needed for any recurrent issues

## 2021-03-19 NOTE — ED Triage Notes (Signed)
Pt BIB EMS. Pt reports with constipation and rectal pain x 4 days.

## 2021-03-19 NOTE — ED Provider Notes (Signed)
[facility] DEPT Provider Note   CSN: [id number] Arrival date & time: 03/19/21  0357     History Chief Complaint  Patient presents with   Constipation   Rectal Pain    Nathan Palmer is a 70 y.o. male.   Constipation  Patient presents to the emergency room with complaints of constipation.  Patient states he has felt constipated for the last several days.  He is not having any trouble with abdominal pain but has not been able to pass a normal stool for at least 4 days.  He denies any fevers or chills.  No vomiting.  He tried taking over-the-counter laxatives without relief.  Patient states he feels like he has a large volume of stool in the rectum and every time he tries to go to the bathroom he feels like there is a large ball of stool that needs to pass.  Past Medical History:  Diagnosis Date   Arthritis    Glaucoma    Hyperlipidemia    Hypertension    Peripheral vascular disease [facility])     Patient Active Problem List   Diagnosis Date Noted   PAD (peripheral artery disease) ([place]) 01/07/2018   Claudication in peripheral vascular disease ([place]) 11/14/2017   Essential hypertension 11/14/2017   Hyperlipidemia 11/14/2017   Tobacco abuse 11/14/2017    Past Surgical History:  Procedure Laterality Date   ABDOMINAL AORTAGRAM Left 12/04/2017   ABDOMINAL AORTOGRAM W/LOWER EXTREMITY (N/A) as a surgical intervention    ABDOMINAL AORTOGRAM W/LOWER EXTREMITY N/A 12/04/2017   Procedure: ABDOMINAL AORTOGRAM W/LOWER EXTREMITY;  Surgeon: Lorretta Harp, MD;  Location: [facility];  Service: Cardiovascular;  Laterality: N/A;   COLONOSCOPY W/ POLYPECTOMY     ENDARTERECTOMY FEMORAL Right 01/07/2018   Procedure: ENDARTERECTOMY FEMORAL RIGHT;  Surgeon: Serafina Mitchell, MD;  Location: [facility];  Service: Vascular;  Laterality: Right;   FEMORAL ENDARTERECTOMY Right 01/07/2018   FOOT SURGERY Bilateral    bone spurs   ILIAC ATHERECTOMY Right 01/07/2018    Procedure: EXTERNAL ILIAC ATHERECTOMY;  Surgeon: Serafina Mitchell, MD;  Location: [facility];  Service: Vascular;  Laterality: Right;   INSERTION OF ILIAC STENT Right 01/07/2018   Procedure: INSERTION OF STENT COMMON AND EXTERNAL ILIAC;  Surgeon: Serafina Mitchell, MD;  Location: [place];  Service: Vascular;  Laterality: Right;   PERIPHERAL VASCULAR INTERVENTION Left 12/04/2017   Procedure: PERIPHERAL VASCULAR INTERVENTION;  Surgeon: Lorretta Harp, MD;  Location: [facility];  Service: Cardiovascular;  Laterality: Left;  left external and common       History reviewed. No pertinent family history.  Social History   Tobacco Use   Smoking status: Every Day    Packs/day: 0.25    Years: 40.00    Pack years: 10.00    Types: Cigarettes    Last attempt to quit: 12/04/2017    Years since quitting: 3.2   Smokeless tobacco: Never  Vaping Use   Vaping Use: Never used  Substance Use Topics   Alcohol use: Yes    Comment:  40 ounce a week   Drug use: Yes    Types: Marijuana    Comment: 01/06/18 - last time was 1 week ago    Home Medications Prior to Admission medications   Medication Sig Start Date End Date Taking? Authorizing Provider  polyethylene glycol powder (MIRALAX) 17 GM/SCOOP powder Take 17 g by mouth daily. Take 1 heaping tablespoon in 4 to 8 ounces of water,  juice, coffee, or tea daily 03/19/21  Yes Dorie Rank, MD  aspirin 81 MG tablet Take 81 mg by mouth daily.    [provider]  atorvastatin (LIPITOR) 80 MG tablet Take 1 tablet (80 mg total) by mouth daily at 6 PM. 12/05/17   Bhagat, Bhavinkumar, PA  clopidogrel (PLAVIX) 75 MG tablet Take 1 tablet (75 mg total) by mouth daily with breakfast. 12/05/17   Bhagat, Bhavinkumar, PA  hydrochlorothiazide (HYDRODIURIL) 25 MG tablet Take 25 mg by mouth daily.    [provider]  latanoprost (XALATAN) 0.005 % ophthalmic solution Place 1 drop into both eyes at bedtime.    [provider]  Skin Protectants, Misc.  (MINERIN) CREA Apply 1 application topically 2 (two) times daily as needed (itching).    [provider]  traMADol (ULTRAM) 50 MG tablet Take 1 tablet (50 mg total) by mouth every 6 (six) hours as needed (pain). 01/09/18   Gabriel Earing, PA-C    Allergies    Patient has no known allergies.  Review of Systems   Review of Systems  Gastrointestinal:  Positive for constipation.  All other systems reviewed and are negative.  Physical Exam Updated Vital Signs BP 126/85   Pulse 72   Temp 99 F (37.2 C) (Oral)   Resp 18   Ht 1.715 m (5' 7.5")   Wt 57.4 kg   SpO2 100%   BMI 19.54 kg/m   Physical Exam Vitals and nursing note reviewed.  Constitutional:      General: He is not in acute distress.    Appearance: He is well-developed.  HENT:     Head: Normocephalic and atraumatic.     Right Ear: External ear normal.     Left Ear: External ear normal.  Eyes:     General: No scleral icterus.       Right eye: No discharge.        Left eye: No discharge.     Conjunctiva/sclera: Conjunctivae normal.  Neck:     Trachea: No tracheal deviation.  Cardiovascular:     Rate and Rhythm: Normal rate and regular rhythm.  Pulmonary:     Effort: Pulmonary effort is normal. No respiratory distress.     Breath sounds: Normal breath sounds. No stridor. No wheezing or rales.  Abdominal:     General: Bowel sounds are normal. There is no distension.     Palpations: Abdomen is soft.     Tenderness: There is no abdominal tenderness. There is no guarding or rebound.  Genitourinary:    Comments: No mass on rectal exam, no blood noted, large volume of soft stool in the rectum Musculoskeletal:        General: No tenderness or deformity.     Cervical back: Neck supple.  Skin:    General: Skin is warm and dry.     Findings: No rash.  Neurological:     General: No focal deficit present.     Mental Status: He is alert.     Cranial Nerves: No cranial nerve deficit (no facial droop,  extraocular movements intact, no slurred speech).     Sensory: No sensory deficit.     Motor: No abnormal muscle tone or seizure activity.     Coordination: Coordination normal.  Psychiatric:        Mood and Affect: Mood normal.    ED Results / Procedures / Treatments   Labs (all labs ordered are listed, but only abnormal results are displayed) Labs  Reviewed  COMPREHENSIVE METABOLIC PANEL - Abnormal; Notable for the following components:      Result Value   Sodium 132 (*)    Glucose, Bld 101 (*)    All other components within normal limits  CBC WITH DIFFERENTIAL/PLATELET - Abnormal; Notable for the following components:   RBC 4.06 (*)    All other components within normal limits    EKG None  Radiology No results found.  Procedures Procedures   Medications Ordered in ED Medications  sodium phosphate (FLEET) 7-19 GM/118ML enema 1 enema (1 enema Rectal Given 03/19/21 0755)    ED Course  I have reviewed the triage vital signs and the nursing notes.  Pertinent labs & imaging results that were available during my care of the patient were reviewed by me and considered in my medical decision making (see chart for details).    MDM Rules/Calculators/A&P                           Patient presented with complaints of constipation.  No abdominal pain, fevers or other symptoms to suggest colitis diverticulitis.  No mass noted on rectal exam the patient did have a large volume of stool.  Enema was given in the ED and the patient had good results.  9:34 AM he is feeling much better and is asking for discharge.  I will give him a prescription for Colace to take early. Final Clinical Impression(s) / ED Diagnoses Final diagnoses:  Constipation, unspecified constipation type    Rx / DC Orders ED Discharge Orders          Ordered    polyethylene glycol powder (MIRALAX) 17 GM/SCOOP powder  Daily        03/19/21 0933             Linwood Dibbles, MD 03/19/21 [phone]

## 2021-12-06 ENCOUNTER — Encounter (HOSPITAL_COMMUNITY): Payer: Self-pay

## 2022-02-02 ENCOUNTER — Encounter (HOSPITAL_COMMUNITY): Payer: Self-pay

## 2022-02-02 ENCOUNTER — Inpatient Hospital Stay (HOSPITAL_COMMUNITY)
Admission: EM | Admit: 2022-02-02 | Discharge: 2022-02-04 | DRG: 440 | Disposition: A | Discharge status: Home / Self Care | Payer: No Typology Code available for payment source | Attending: Internal Medicine | Admitting: Internal Medicine

## 2022-02-02 ENCOUNTER — Other Ambulatory Visit: Payer: Self-pay

## 2022-02-02 ENCOUNTER — Emergency Department (HOSPITAL_COMMUNITY): Payer: No Typology Code available for payment source

## 2022-02-02 DIAGNOSIS — Z7901 Long term (current) use of anticoagulants: Secondary | ICD-10-CM

## 2022-02-02 DIAGNOSIS — I739 Peripheral vascular disease, unspecified: Secondary | ICD-10-CM | POA: Diagnosis not present

## 2022-02-02 DIAGNOSIS — K2970 Gastritis, unspecified, without bleeding: Secondary | ICD-10-CM | POA: Diagnosis present

## 2022-02-02 DIAGNOSIS — K852 Alcohol induced acute pancreatitis without necrosis or infection: Principal | ICD-10-CM | POA: Diagnosis present

## 2022-02-02 DIAGNOSIS — I10 Essential (primary) hypertension: Secondary | ICD-10-CM | POA: Diagnosis present

## 2022-02-02 DIAGNOSIS — F17210 Nicotine dependence, cigarettes, uncomplicated: Secondary | ICD-10-CM | POA: Diagnosis present

## 2022-02-02 DIAGNOSIS — F1020 Alcohol dependence, uncomplicated: Secondary | ICD-10-CM | POA: Diagnosis present

## 2022-02-02 DIAGNOSIS — K769 Liver disease, unspecified: Secondary | ICD-10-CM | POA: Diagnosis not present

## 2022-02-02 DIAGNOSIS — H409 Unspecified glaucoma: Secondary | ICD-10-CM | POA: Diagnosis present

## 2022-02-02 DIAGNOSIS — K861 Other chronic pancreatitis: Secondary | ICD-10-CM | POA: Diagnosis not present

## 2022-02-02 DIAGNOSIS — K859 Acute pancreatitis without necrosis or infection, unspecified: Secondary | ICD-10-CM | POA: Diagnosis not present

## 2022-02-02 DIAGNOSIS — K2980 Duodenitis without bleeding: Secondary | ICD-10-CM | POA: Diagnosis present

## 2022-02-02 DIAGNOSIS — E785 Hyperlipidemia, unspecified: Secondary | ICD-10-CM | POA: Diagnosis present

## 2022-02-02 DIAGNOSIS — K860 Alcohol-induced chronic pancreatitis: Secondary | ICD-10-CM | POA: Diagnosis present

## 2022-02-02 DIAGNOSIS — Z79899 Other long term (current) drug therapy: Secondary | ICD-10-CM

## 2022-02-02 DIAGNOSIS — Z7982 Long term (current) use of aspirin: Secondary | ICD-10-CM

## 2022-02-02 LAB — CBC
HCT: 40.1 % (ref 39.0–52.0)
Hemoglobin: 13.5 g/dL (ref 13.0–17.0)
MCH: 32.1 pg (ref 26.0–34.0)
MCHC: 33.7 g/dL (ref 30.0–36.0)
MCV: 95.2 fL (ref 80.0–100.0)
Platelets: 171 10*3/uL (ref 150–400)
RBC: 4.21 MIL/uL — ABNORMAL LOW (ref 4.22–5.81)
RDW: 13.4 % (ref 11.5–15.5)
WBC: 10.5 10*3/uL (ref 4.0–10.5)
nRBC: 0 % (ref 0.0–0.2)

## 2022-02-02 LAB — COMPREHENSIVE METABOLIC PANEL
ALT: 25 U/L (ref 0–44)
AST: 50 U/L — ABNORMAL HIGH (ref 15–41)
Albumin: 4.2 g/dL (ref 3.5–5.0)
Alkaline Phosphatase: 91 U/L (ref 38–126)
Anion gap: 14 (ref 5–15)
BUN: 13 mg/dL (ref 8–23)
CO2: 22 mmol/L (ref 22–32)
Calcium: 9.3 mg/dL (ref 8.9–10.3)
Chloride: 100 mmol/L (ref 98–111)
Creatinine, Ser: 1.22 mg/dL (ref 0.61–1.24)
GFR, Estimated: >60 mL/min (ref >60)
Glucose, Bld: 120 mg/dL — ABNORMAL HIGH (ref 70–99)
Potassium: 4 mmol/L (ref 3.5–5.1)
Sodium: 136 mmol/L (ref 135–145)
Total Bilirubin: 0.5 mg/dL (ref 0.3–1.2)
Total Protein: 7.8 g/dL (ref 6.5–8.1)

## 2022-02-02 LAB — LIPASE, BLOOD: Lipase: 885 U/L — ABNORMAL HIGH (ref 11–51)

## 2022-02-02 MED ORDER — ENOXAPARIN SODIUM 40 MG/0.4ML IJ SOSY
40.0000 mg | PREFILLED_SYRINGE | Freq: Every day | INTRAMUSCULAR | Status: DC
  Administered 2022-02-03 – 2022-02-04 (×2): 40 mg via SUBCUTANEOUS
  Filled 2022-02-02 (×2): qty 0.4

## 2022-02-02 MED ORDER — LORAZEPAM 2 MG/ML IJ SOLN: 1.0000 mg | INTRAMUSCULAR | Status: DC | PRN

## 2022-02-02 MED ORDER — ACETAMINOPHEN 325 MG PO TABS
650.0000 mg | ORAL_TABLET | Freq: Four times a day (QID) | ORAL | Status: DC | PRN
  Administered 2022-02-03: 650 mg via ORAL
  Filled 2022-02-02: qty 2

## 2022-02-02 MED ORDER — LACTATED RINGERS IV BOLUS
1000.0000 mL | Freq: Once | INTRAVENOUS | Status: AC
  Administered 2022-02-02: 1000 mL via INTRAVENOUS

## 2022-02-02 MED ORDER — THIAMINE MONONITRATE 100 MG PO TABS
100.0000 mg | ORAL_TABLET | Freq: Every day | ORAL | Status: DC
  Administered 2022-02-03 – 2022-02-04 (×2): 100 mg via ORAL
  Filled 2022-02-02 (×3): qty 1

## 2022-02-02 MED ORDER — ACETAMINOPHEN 650 MG RE SUPP: 650.0000 mg | Freq: Four times a day (QID) | RECTAL | Status: DC | PRN

## 2022-02-02 MED ORDER — LORAZEPAM 1 MG PO TABS: 1.0000 mg | ORAL_TABLET | ORAL | Status: DC | PRN

## 2022-02-02 MED ORDER — ONDANSETRON HCL 4 MG/2ML IJ SOLN: 4.0000 mg | Freq: Four times a day (QID) | INTRAMUSCULAR | Status: DC | PRN

## 2022-02-02 MED ORDER — OXYCODONE-ACETAMINOPHEN 5-325 MG PO TABS: 1.0000 | ORAL_TABLET | Freq: Once | ORAL | Status: DC

## 2022-02-02 MED ORDER — FOLIC ACID 1 MG PO TABS
1.0000 mg | ORAL_TABLET | Freq: Every day | ORAL | Status: DC
  Administered 2022-02-03 – 2022-02-04 (×2): 1 mg via ORAL
  Filled 2022-02-02 (×2): qty 1

## 2022-02-02 MED ORDER — ONDANSETRON HCL 4 MG PO TABS: 4.0000 mg | ORAL_TABLET | Freq: Four times a day (QID) | ORAL | Status: DC | PRN

## 2022-02-02 MED ORDER — OXYCODONE HCL 5 MG PO TABS
5.0000 mg | ORAL_TABLET | ORAL | Status: DC | PRN
  Administered 2022-02-03 – 2022-02-04 (×4): 5 mg via ORAL
  Filled 2022-02-02 (×4): qty 1

## 2022-02-02 MED ORDER — ADULT MULTIVITAMIN W/MINERALS CH
1.0000 | ORAL_TABLET | Freq: Every day | ORAL | Status: DC
  Administered 2022-02-03 – 2022-02-04 (×2): 1 via ORAL
  Filled 2022-02-02 (×2): qty 1

## 2022-02-02 MED ORDER — ONDANSETRON 4 MG PO TBDP: 8.0000 mg | ORAL_TABLET | Freq: Once | ORAL | Status: DC

## 2022-02-02 MED ORDER — IOHEXOL 350 MG/ML SOLN
75.0000 mL | Freq: Once | INTRAVENOUS | Status: AC | PRN
  Administered 2022-02-02: 75 mL via INTRAVENOUS

## 2022-02-02 MED ORDER — ONDANSETRON HCL 4 MG/2ML IJ SOLN
4.0000 mg | Freq: Once | INTRAMUSCULAR | Status: AC
Start: 2022-02-02 — End: 2022-02-02
  Administered 2022-02-02: 4 mg via INTRAVENOUS
  Filled 2022-02-02: qty 2

## 2022-02-02 MED ORDER — ATORVASTATIN CALCIUM 80 MG PO TABS
80.0000 mg | ORAL_TABLET | Freq: Every day | ORAL | Status: DC
  Administered 2022-02-03: 80 mg via ORAL
  Filled 2022-02-02: qty 1

## 2022-02-02 MED ORDER — CLOPIDOGREL BISULFATE 75 MG PO TABS
75.0000 mg | ORAL_TABLET | Freq: Every day | ORAL | Status: DC
  Administered 2022-02-03 – 2022-02-04 (×2): 75 mg via ORAL
  Filled 2022-02-02 (×2): qty 1

## 2022-02-02 MED ORDER — MORPHINE SULFATE (PF) 4 MG/ML IV SOLN
4.0000 mg | INTRAVENOUS | Status: AC
Start: 2022-02-02 — End: 2022-02-02
  Administered 2022-02-02: 4 mg via INTRAVENOUS
  Filled 2022-02-02: qty 1

## 2022-02-02 MED ORDER — ASPIRIN 81 MG PO TBEC
81.0000 mg | DELAYED_RELEASE_TABLET | Freq: Every day | ORAL | Status: DC
  Administered 2022-02-03 – 2022-02-04 (×2): 81 mg via ORAL
  Filled 2022-02-02 (×2): qty 1

## 2022-02-02 MED ORDER — LORAZEPAM 2 MG/ML IJ SOLN: 0.0000 mg | Freq: Two times a day (BID) | INTRAMUSCULAR | Status: DC

## 2022-02-02 MED ORDER — THIAMINE HCL 100 MG/ML IJ SOLN
100.0000 mg | Freq: Every day | INTRAMUSCULAR | Status: DC
  Administered 2022-02-02: 100 mg via INTRAVENOUS
  Filled 2022-02-02 (×2): qty 2

## 2022-02-02 MED ORDER — MORPHINE SULFATE (PF) 4 MG/ML IV SOLN
6.0000 mg | Freq: Once | INTRAVENOUS | Status: AC
  Administered 2022-02-02: 6 mg via INTRAVENOUS
  Filled 2022-02-02: qty 2

## 2022-02-02 MED ORDER — LACTATED RINGERS IV SOLN: INTRAVENOUS | Status: AC

## 2022-02-02 MED ORDER — LORAZEPAM 2 MG/ML IJ SOLN
1.0000 mg | INTRAMUSCULAR | Status: DC | PRN
  Filled 2022-02-02: qty 1

## 2022-02-02 MED ORDER — LORAZEPAM 2 MG/ML IJ SOLN: 0.0000 mg | Freq: Four times a day (QID) | INTRAMUSCULAR | Status: DC

## 2022-02-02 MED ORDER — HYDROMORPHONE HCL 1 MG/ML IJ SOLN
0.5000 mg | INTRAMUSCULAR | Status: DC | PRN
  Administered 2022-02-03 – 2022-02-04 (×2): 1 mg via INTRAVENOUS
  Filled 2022-02-02 (×2): qty 1

## 2022-02-02 NOTE — ED Triage Notes (Signed)
Patient complains of right sided abdominal pain with nausea and vomiting for the past several days. Patient with reported chronic cough. Denies fever, no diarrhea

## 2022-02-02 NOTE — ED Notes (Signed)
Patient transported to CT 

## 2022-02-02 NOTE — ED Provider Notes (Signed)
[facility] EMERGENCY DEPARTMENT Provider Note   CSN: [id number] Arrival date & time: 02/02/22  1259     History {Add pertinent medical, surgical, social history, OB history to HPI:1} No chief complaint on file.   Nathan Shores Sr. is a 71 y.o. male.  71 yo M with hx of ETOH use, HTN, PAD on ASA and plavix, and gout who presents to the emergency  Weeks but worsened over the past day Worse with laying down Epigastric Drinks 1 40 per day. Yesterday in the morning was last drink. No hx of wd sxs.  N/v multiple times per day nbnb  Only surgeries are hernia and vascular stents. No new testicular swelling or pain.        Home Medications Prior to Admission medications   Medication Sig Start Date End Date Taking? Authorizing Provider  aspirin 81 MG tablet Take 81 mg by mouth daily.    [provider]  atorvastatin (LIPITOR) 80 MG tablet Take 1 tablet (80 mg total) by mouth daily at 6 PM. 12/05/17   Bhagat, Bhavinkumar, PA  clopidogrel (PLAVIX) 75 MG tablet Take 1 tablet (75 mg total) by mouth daily with breakfast. 12/05/17   Bhagat, Bhavinkumar, PA  hydrochlorothiazide (HYDRODIURIL) 25 MG tablet Take 25 mg by mouth daily.    [provider]  latanoprost (XALATAN) 0.005 % ophthalmic solution Place 1 drop into both eyes at bedtime.    [provider]  polyethylene glycol powder (MIRALAX) 17 GM/SCOOP powder Take 17 g by mouth daily. Take 1 heaping tablespoon in 4 to 8 ounces of water, juice, coffee, or tea daily 03/19/21   Dorie Rank, MD  Skin Protectants, Misc. (MINERIN) CREA Apply 1 application topically 2 (two) times daily as needed (itching).    [provider]  traMADol (ULTRAM) 50 MG tablet Take 1 tablet (50 mg total) by mouth every 6 (six) hours as needed (pain). 01/09/18   Gabriel Earing, PA-C      Allergies    Patient has no known allergies.    Review of Systems   Review of Systems  Physical Exam Updated Vital  Signs BP 112/80 (BP Location: Right Arm)   Pulse 80   Temp 99.6 F (37.6 C)   Resp 15   SpO2 100%  Physical Exam  ED Results / Procedures / Treatments   Labs (all labs ordered are listed, but only abnormal results are displayed) Labs Reviewed  LIPASE, BLOOD - Abnormal; Notable for the following components:      Result Value   Lipase 885 (*)    All other components within normal limits  COMPREHENSIVE METABOLIC PANEL - Abnormal; Notable for the following components:   Glucose, Bld 120 (*)    AST 50 (*)    All other components within normal limits  CBC - Abnormal; Notable for the following components:   RBC 4.21 (*)    All other components within normal limits  URINALYSIS, ROUTINE W REFLEX MICROSCOPIC    EKG None  Radiology No results found.  Procedures Procedures  {Document cardiac monitor, telemetry assessment procedure when appropriate:1}  Medications Ordered in ED Medications  oxyCODONE-acetaminophen (PERCOCET/ROXICET) 5-325 MG per tablet 1 tablet (has no administration in time range)  ondansetron (ZOFRAN-ODT) disintegrating tablet 8 mg (has no administration in time range)    ED Course/ Medical Decision Making/ A&P  Medical Decision Making Amount and/or Complexity of Data Reviewed Labs: ordered. Radiology: ordered.  Risk OTC drugs. Prescription drug management.   ***  {Document critical care time when appropriate:1} {Document review of labs and clinical decision tools ie heart score, Chads2Vasc2 etc:1}  {Document your independent review of radiology images, and any outside records:1} {Document your discussion with family members, caretakers, and with consultants:1} {Document social determinants of health affecting pt's care:1} {Document your decision making why or why not admission, treatments were needed:1} Final Clinical Impression(s) / ED Diagnoses Final diagnoses:  None    Rx / DC Orders ED Discharge Orders      None

## 2022-02-02 NOTE — H&P (Addendum)
History and Physical    Bartow Harstad [id number] DOB: 1950-05-03 DOA: 02/02/2022  PCP: Charlsie Merles, MD   Patient coming from: Home   Chief Complaint: Abdominal pain, N/V   HPI: Curly Shores Sr. is a pleasant 71 y.o. male with medical history significant for hypertension, hyperlipidemia, PAD, and excessive alcohol use who presents emergency department with several days of abdominal pain, nausea, and vomiting.  Patient developed pain in the mid and upper abdomen approximately a week ago which was mild initially but has now become severe.  He also developed nausea with nonbloody vomiting over the past day.  He does not recall experiencing the symptoms previously.  He drinks a 40 oz beer daily.  ED Course: Upon arrival to the ED, patient is found to be afebrile and saturating well on room air with stable blood pressure.  CMP notable for AST of 50.  CBC unremarkable.  Lipase elevated to 885.  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  Also noted on CT are indeterminate liver lesions.  He was given a liter of LR, 2 doses of morphine, and Zofran in the ED.  Review of Systems:  All other systems reviewed and apart from HPI, are negative.  Past Medical History:  Diagnosis Date   Arthritis    Glaucoma    Hyperlipidemia    Hypertension    Peripheral vascular disease [facility])     Past Surgical History:  Procedure Laterality Date   ABDOMINAL AORTAGRAM Left 12/04/2017   ABDOMINAL AORTOGRAM W/LOWER EXTREMITY (N/A) as a surgical intervention    ABDOMINAL AORTOGRAM W/LOWER EXTREMITY N/A 12/04/2017   Procedure: ABDOMINAL AORTOGRAM W/LOWER EXTREMITY;  Surgeon: Lorretta Harp, MD;  Location: [place] [facility];  Service: Cardiovascular;  Laterality: N/A;   COLONOSCOPY W/ POLYPECTOMY     ENDARTERECTOMY FEMORAL Right 01/07/2018   Procedure: ENDARTERECTOMY FEMORAL RIGHT;  Surgeon: Serafina Mitchell, MD;  Location: [place];  Service:  Vascular;  Laterality: Right;   FEMORAL ENDARTERECTOMY Right 01/07/2018   FOOT SURGERY Bilateral    bone spurs   ILIAC ATHERECTOMY Right 01/07/2018   Procedure: EXTERNAL ILIAC ATHERECTOMY;  Surgeon: Serafina Mitchell, MD;  Location: [place];  Service: Vascular;  Laterality: Right;   INSERTION OF ILIAC STENT Right 01/07/2018   Procedure: INSERTION OF STENT COMMON AND EXTERNAL ILIAC;  Surgeon: Serafina Mitchell, MD;  Location: [place];  Service: Vascular;  Laterality: Right;   PERIPHERAL VASCULAR INTERVENTION Left 12/04/2017   Procedure: PERIPHERAL VASCULAR INTERVENTION;  Surgeon: Lorretta Harp, MD;  Location: [place] [facility];  Service: Cardiovascular;  Laterality: Left;  left external and common    Social History:   reports that he has been smoking cigarettes. He has a 10.00 pack-year smoking history. He has never used smokeless tobacco. He reports current alcohol use. He reports current drug use. Drug: Marijuana.  No Known Allergies  History reviewed. No pertinent family history.   Prior to Admission medications   Medication Sig Start Date End Date Taking? Authorizing Provider  aspirin 81 MG tablet Take 81 mg by mouth daily.    [provider]  atorvastatin (LIPITOR) 80 MG tablet Take 1 tablet (80 mg total) by mouth daily at 6 PM. 12/05/17   Bhagat, Bhavinkumar, PA  clopidogrel (PLAVIX) 75 MG tablet Take 1 tablet (75 mg total) by mouth daily with breakfast. 12/05/17   Bhagat, Bhavinkumar, PA  hydrochlorothiazide (HYDRODIURIL) 25 MG tablet Take 25 mg  by mouth daily.    [provider]  latanoprost (XALATAN) 0.005 % ophthalmic solution Place 1 drop into both eyes at bedtime.    [provider]  polyethylene glycol powder (MIRALAX) 17 GM/SCOOP powder Take 17 g by mouth daily. Take 1 heaping tablespoon in 4 to 8 ounces of water, juice, coffee, or tea daily 03/19/21   Dorie Rank, MD  Skin Protectants, Misc. (MINERIN) CREA Apply 1 application topically 2 (two) times daily  as needed (itching).    [provider]  traMADol (ULTRAM) 50 MG tablet Take 1 tablet (50 mg total) by mouth every 6 (six) hours as needed (pain). 01/09/18   Gabriel Earing, PA-C    Physical Exam: Vitals:   02/02/22 2100 02/02/22 2115 02/02/22 2130 02/02/22 2230  BP: 123/71 113/73 109/68 122/72  Pulse: 81 80 79 72  Resp: 15 13 14 12   Temp:      TempSrc:      SpO2: 98% 95% 96% 96%     Constitutional: NAD, calm, frail appearing   Eyes: PERTLA, lids and conjunctivae normal ENMT: Mucous membranes are moist. Posterior pharynx clear of any exudate or lesions.   Neck: supple, no masses  Respiratory: no wheezing, no crackles. No accessory muscle use.  Cardiovascular: S1 & S2 heard, regular rate and rhythm. No extremity edema.   Abdomen: soft, tender in epigastrium without rebound pain or guarding. Bowel sounds active.  Musculoskeletal: no clubbing / cyanosis. No joint deformity upper and lower extremities.   Skin: no significant rashes, lesions, ulcers. Warm, dry, well-perfused. Neurologic: CN 2-12 grossly intact. Moving all extremities. Alert and oriented.  Psychiatric: Pleasant. Cooperative.    Labs and Imaging on Admission: I have personally reviewed following labs and imaging studies  CBC: Recent Labs  Lab 02/02/22 1408  WBC 10.5  HGB 13.5  HCT 40.1  MCV 95.2  PLT [id number]   Basic Metabolic Panel: Recent Labs  Lab 02/02/22 1408  NA 136  K 4.0  CL 100  CO2 22  GLUCOSE 120*  BUN 13  CREATININE 1.22  CALCIUM 9.3   GFR: CrCl cannot be calculated (Unknown ideal weight.). Liver Function Tests: Recent Labs  Lab 02/02/22 1408  AST 50*  ALT 25  ALKPHOS 91  BILITOT 0.5  PROT 7.8  ALBUMIN 4.2   Recent Labs  Lab 02/02/22 1408  LIPASE 885*   No results for input(s): "AMMONIA" in the last 168 hours. Coagulation Profile: No results for input(s): "INR", "PROTIME" in the last 168 hours. Cardiac Enzymes: No results for input(s): "CKTOTAL", "CKMB",  "CKMBINDEX", "TROPONINI" in the last 168 hours. BNP (last 3 results) No results for input(s): "PROBNP" in the last 8760 hours. HbA1C: No results for input(s): "HGBA1C" in the last 72 hours. CBG: No results for input(s): "GLUCAP" in the last 168 hours. Lipid Profile: No results for input(s): "CHOL", "HDL", "LDLCALC", "TRIG", "CHOLHDL", "LDLDIRECT" in the last 72 hours. Thyroid Function Tests: No results for input(s): "TSH", "T4TOTAL", "FREET4", "T3FREE", "THYROIDAB" in the last 72 hours. Anemia Panel: No results for input(s): "VITAMINB12", "FOLATE", "FERRITIN", "TIBC", "IRON", "RETICCTPCT" in the last 72 hours. Urine analysis:    Component Value Date/Time   COLORURINE YELLOW 01/07/2018 [place] 01/07/2018 0934   LABSPEC 1.021 01/07/2018 0934   PHURINE 5.0 01/07/2018 0934   GLUCOSEU NEGATIVE 01/07/2018 0934   HGBUR SMALL (A) 01/07/2018 0934   BILIRUBINUR NEGATIVE 01/07/2018 0934   KETONESUR NEGATIVE 01/07/2018 0934   PROTEINUR NEGATIVE 01/07/2018 0934   NITRITE  NEGATIVE 01/07/2018 0934   LEUKOCYTESUR NEGATIVE 01/07/2018 0934   Sepsis Labs: @LABRCNTIP (procalcitonin:4,lacticidven:4) )No results found for this or any previous visit (from the past 240 hour(s)).   Radiological Exams on Admission: CT ABDOMEN PELVIS W CONTRAST  Result Date: 02/02/2022 CLINICAL DATA:  Abdominal pain with suspected acute pancreatitis, pain exacerbated with eating. Nausea and vomiting also. EXAM: CT ABDOMEN AND PELVIS WITH CONTRAST TECHNIQUE: Multidetector CT imaging of the abdomen and pelvis was performed using the standard protocol following bolus administration of intravenous contrast. RADIATION DOSE REDUCTION: This exam was performed according to the departmental dose-optimization program which includes automated exposure control, adjustment of the mA and/or kV according to patient size and/or use of iterative reconstruction technique. CONTRAST:  67mL OMNIPAQUE IOHEXOL 350 MG/ML SOLN  COMPARISON:  None Available. FINDINGS: Lower chest: Lung bases show emphysematous and mild scarring changes with asymmetric right lower lobe bullous disease. The cardiac size is normal. Hepatobiliary: The liver 16 cm length with mild steatosis. There are several scattered subcentimeter hypodense lesions in the liver which are too small to characterize. Gallbladder and bile ducts are unremarkable. Pancreas: There are scattered coarse calcifications in the pancreas consistent with chronic calcific pancreatitis. There is mild peripancreatic edema alongside the head, neck and proximal body sections consistent with acute on chronic pancreatitis. There is no pancreatic ductal dilatation, mass enhancement, or hypoenhancement suspicious for necrosis. There is no peripancreatic hemorrhage with only minimal nonlocalizing proximal peripancreatic fluid. Spleen: Normal. Adrenals/Urinary Tract: There is no adrenal mass. There are a few subcentimeter left renal hypodense lesions which are too small to characterize. Right renal cortex is unremarkable. No follow-up imaging is recommended. For reference see JACR 2018 Feb; 264-273, Management of the Incidental RenalMass on CT, [employer] 2021; 814-848, Bosniak Classification of Cystic Renal Masses, Version 2019. There are renovascular calcifications along side both renal hila. There are no caliceal stones or obstructing stones or hydronephrosis either side. The bladder thickness is normal. Stomach/Bowel: There are moderate thickened folds in the mid to distal stomach, descending duodenal. Rest of the small bowel is unremarkable. There is contrast in the distal ileum. Contrast in the ascending colon. Uncomplicated colonic diverticulosis. Normal caliber appendix. Vascular/Lymphatic: The abdominal aorta and iliac arteries are heavily calcified. There are moderate to heavy calcifications in the proximal renal arteries. No adenopathy is seen. There is left pelvic venous congestion.  There appears to be a high-grade calcific stenosis in the proximal left superficial femoral artery. Reproductive: Unremarkable prostate. Other: There is no pelvic ascites, only minimal fluid in the region of the pancreas. There is no free air, free hemorrhage or abscess. There is no incarcerated hernia. Small umbilical fat hernia. There is a tiny left inguinal fat hernia. Musculoskeletal: Osteopenia and degenerative change lumbar spine. Ankylosis left SI joint. No concerning regional bone lesion. IMPRESSION: 1. 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 Laboratory and clinical correlation suggested. 2. Small amount of nonlocalizing reactive fluid. No abscess, ductal dilatation, mass enhancement or focal hypoenhancement. 3. Moderate thickening in the stomach and duodenum, which could be due to gastroenteritis or reactive from the pancreatitis. 4. Uncomplicated colonic diverticulosis. 5. Several subcentimeter hepatic low-density lesions which are too small to characterize. If there is no known history of cancer or known hepatic risk factors in this patient, no follow-up imaging is required. If there are hepatic risk factors or malignancy history, 3-six-month follow-up MRI is recommended. 6. Heavy aortoiliac atherosclerosis, suspected high-grade calcific stenosis in the proximal left  superficial femoral artery, and left pelvic venous congestion. 7. Emphysema. Electronically Signed   By: Telford Nab M.D.   On: 02/02/2022 22:41    EKG: Independently reviewed. Sinus rhythm.   Assessment/Plan   1. Acute on chronic pancreatitis  - Likely from alcohol  - Check triglycerides, continue bowel-rest, IVF hydration, and pain-control   2. PAD  - No acute ischemia   - Continue Lipitor, ASA, Plavix   3. Alcohol dependence  - Monitor with CIWA, use Ativan if needed, supplement vitamins   4. Liver lesions  - Noted incidentally on CT in  ED - Discussed with patient, outpatient follow-up recommended    DVT prophylaxis: Lovenox  Code Status: Full  Level of Care: Level of care: Med-Surg Family Communication: none present  Disposition Plan:  Patient is from: home  Anticipated d/c is to: Home  Anticipated d/c date is: possibly as early as 10/22 or 02/04/22  Patient currently: Pending pain-control, advancement of diet  Consults called: none  Admission status: Observation     Vianne Bulls, MD [facility]  02/02/2022, 11:13 PM

## 2022-02-02 NOTE — ED Provider Triage Note (Signed)
Emergency Medicine Provider Triage Evaluation Note  Nathan Shores Sr. , a 71 y.o. male  was evaluated in triage.  Pt complains of abdominal pain.  Patient states that she had abdominal pain for the past month with worsening of symptoms over the past 2 days.  Abdominal pain is located in the epigastric region without radiation.  Pain is worsened sometimes with eating.  He has had associated nausea and vomiting.  Reports chronic alcohol use and marijuana use but no recent binge drinking.  Last alcohol marijuana use was yesterday.  Denies fever, chills, night sweats, hematemesis, urinary symptoms, hematochezia, melena.  Patient has significant history of vascular disease..  Review of Systems  Positive: See above Negative:   Physical Exam  BP 109/71 (BP Location: Right Arm)   Pulse 80   Temp 99 F (37.2 C) (Oral)   Resp 18   SpO2 100%  Gen:   Awake, no distress   Resp:  Normal effort  MSK:   Moves extremities without difficulty  Other:  Epigastric tenderness to palpation  Medical Decision Making  Medically screening exam initiated at 2:19 PM.  Appropriate orders placed.  Nathan Shores Sr. was informed that the remainder of the evaluation will be completed by another provider, this initial triage assessment does not replace that evaluation, and the importance of remaining in the ED until their evaluation is complete.     Wilnette Kales, [place] 02/02/22 1420

## 2022-02-03 DIAGNOSIS — H409 Unspecified glaucoma: Secondary | ICD-10-CM | POA: Diagnosis present

## 2022-02-03 DIAGNOSIS — Z7901 Long term (current) use of anticoagulants: Secondary | ICD-10-CM | POA: Diagnosis not present

## 2022-02-03 DIAGNOSIS — K769 Liver disease, unspecified: Secondary | ICD-10-CM | POA: Diagnosis present

## 2022-02-03 DIAGNOSIS — K2970 Gastritis, unspecified, without bleeding: Secondary | ICD-10-CM | POA: Diagnosis present

## 2022-02-03 DIAGNOSIS — K852 Alcohol induced acute pancreatitis without necrosis or infection: Secondary | ICD-10-CM | POA: Diagnosis present

## 2022-02-03 DIAGNOSIS — K859 Acute pancreatitis without necrosis or infection, unspecified: Secondary | ICD-10-CM | POA: Diagnosis not present

## 2022-02-03 DIAGNOSIS — I739 Peripheral vascular disease, unspecified: Secondary | ICD-10-CM | POA: Diagnosis present

## 2022-02-03 DIAGNOSIS — Z7982 Long term (current) use of aspirin: Secondary | ICD-10-CM | POA: Diagnosis not present

## 2022-02-03 DIAGNOSIS — K2980 Duodenitis without bleeding: Secondary | ICD-10-CM | POA: Diagnosis present

## 2022-02-03 DIAGNOSIS — K860 Alcohol-induced chronic pancreatitis: Secondary | ICD-10-CM | POA: Diagnosis present

## 2022-02-03 DIAGNOSIS — I10 Essential (primary) hypertension: Secondary | ICD-10-CM | POA: Diagnosis present

## 2022-02-03 DIAGNOSIS — K861 Other chronic pancreatitis: Secondary | ICD-10-CM | POA: Diagnosis not present

## 2022-02-03 DIAGNOSIS — Z79899 Other long term (current) drug therapy: Secondary | ICD-10-CM | POA: Diagnosis not present

## 2022-02-03 DIAGNOSIS — F17210 Nicotine dependence, cigarettes, uncomplicated: Secondary | ICD-10-CM | POA: Diagnosis present

## 2022-02-03 DIAGNOSIS — F1020 Alcohol dependence, uncomplicated: Secondary | ICD-10-CM | POA: Diagnosis present

## 2022-02-03 DIAGNOSIS — E785 Hyperlipidemia, unspecified: Secondary | ICD-10-CM | POA: Diagnosis present

## 2022-02-03 LAB — COMPREHENSIVE METABOLIC PANEL
ALT: 20 U/L (ref 0–44)
AST: 30 U/L (ref 15–41)
Albumin: 3.6 g/dL (ref 3.5–5.0)
Alkaline Phosphatase: 68 U/L (ref 38–126)
Anion gap: 11 (ref 5–15)
BUN: 10 mg/dL (ref 8–23)
CO2: 23 mmol/L (ref 22–32)
Calcium: 8.8 mg/dL — ABNORMAL LOW (ref 8.9–10.3)
Chloride: 102 mmol/L (ref 98–111)
Creatinine, Ser: 1 mg/dL (ref 0.61–1.24)
GFR, Estimated: >60 mL/min (ref >60)
Glucose, Bld: 118 mg/dL — ABNORMAL HIGH (ref 70–99)
Potassium: 3.6 mmol/L (ref 3.5–5.1)
Sodium: 136 mmol/L (ref 135–145)
Total Bilirubin: 0.8 mg/dL (ref 0.3–1.2)
Total Protein: 6.7 g/dL (ref 6.5–8.1)

## 2022-02-03 LAB — CBC
HCT: 34.7 % — ABNORMAL LOW (ref 39.0–52.0)
Hemoglobin: 12 g/dL — ABNORMAL LOW (ref 13.0–17.0)
MCH: 32.3 pg (ref 26.0–34.0)
MCHC: 34.6 g/dL (ref 30.0–36.0)
MCV: 93.3 fL (ref 80.0–100.0)
Platelets: 147 10*3/uL — ABNORMAL LOW (ref 150–400)
RBC: 3.72 MIL/uL — ABNORMAL LOW (ref 4.22–5.81)
RDW: 13.6 % (ref 11.5–15.5)
WBC: 9.6 10*3/uL (ref 4.0–10.5)
nRBC: 0 % (ref 0.0–0.2)

## 2022-02-03 LAB — MAGNESIUM: Magnesium: 2.2 mg/dL (ref 1.7–2.4)

## 2022-02-03 LAB — PHOSPHORUS: Phosphorus: 3.8 mg/dL (ref 2.5–4.6)

## 2022-02-03 LAB — LIPASE, BLOOD: Lipase: 143 U/L — ABNORMAL HIGH (ref 11–51)

## 2022-02-03 LAB — TRIGLYCERIDES: Triglycerides: 88 mg/dL (ref <150)

## 2022-02-03 MED ORDER — TAMSULOSIN HCL 0.4 MG PO CAPS
0.8000 mg | ORAL_CAPSULE | Freq: Every day | ORAL | Status: DC
  Administered 2022-02-03 – 2022-02-04 (×2): 0.8 mg via ORAL
  Filled 2022-02-03 (×2): qty 2

## 2022-02-03 MED ORDER — PANTOPRAZOLE SODIUM 40 MG IV SOLR
40.0000 mg | Freq: Every day | INTRAVENOUS | Status: DC
  Administered 2022-02-03 – 2022-02-04 (×2): 40 mg via INTRAVENOUS
  Filled 2022-02-03 (×2): qty 10

## 2022-02-03 MED ORDER — FINASTERIDE 5 MG PO TABS
5.0000 mg | ORAL_TABLET | Freq: Every day | ORAL | Status: DC
  Administered 2022-02-03 – 2022-02-04 (×2): 5 mg via ORAL
  Filled 2022-02-03 (×2): qty 1

## 2022-02-03 NOTE — Progress Notes (Signed)
PROGRESS NOTE  Nathan SANSOUCIE Sr.  DOB: [birth year]  PCP: Sherwood Gambler, MD KGY:[id number]  DOA: 02/02/2022  LOS: 0 days  Hospital Day: 2  Brief narrative: Nathan MONTZ Sr. is a 71 y.o. male with PMH significant for chronic heavy alcohol use, HTN, HLD, PAD, arthritis 10/21, patient presented to the ED with complaint of several days of abdominal pain, nausea, nonbloody vomiting.  Worsening in severity for a week.  He drinks a 40 ounce beer daily. In the ED, patient had a temperature of 99.6, heart rate 80s, blood pressure in normal range Labs with remarkable CBC, BMP with lipase level elevated to 885. 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 Patient was given IV fluid, IV morphine, Zofran.  Admitted to [facility] for pancreatitis  Subjective: Patient was seen and examined this morning.  Pleasant elderly African-American male.  Lying on bed.  Not in distress.  Mild generalized abdominal discomfort and tenderness persist.  No vomiting since last night Chart reviewed.  Remains hemodynamically stable Lipase level pending this morning.  Assessment and plan: Acute on chronic pancreatitis  Presented with progressively worsening abdomen pain, nausea, vomiting in the setting of chronic alcoholism  Lipase level elevated, LFTs normal as below. CT abdomen and pelvis finding as above Triglyceride levels not elevated. Currently uncontrolled managed with IV fluid, IV analgesia, IV antiemetics, bowel rest Pending lipase level this morning Currently NPO.  No vomiting since last night.  Start on clear liquid diet Recent Labs  Lab 02/02/22 1408 02/03/22 0228  AST 50* 30  ALT 25 20  ALKPHOS 91 68  BILITOT 0.5 0.8  PROT 7.8 6.7  ALBUMIN 4.2 3.6  LIPASE 885*  --   PLT 171 147*   Chronic alcoholism Counseled to quit.   Monitor with CIWA, use Ativan if needed, supplement vitamins    Alcoholic gastritis/duodenitis Pain abdomen could also be secondary to gastritis/duodenitis because of alcohol use.  CT abdomen noted moderate thickening of the stomach and duodenum. Start PPI  Essential hypertension PTA on amlodipine 5 mg daily.  Resume when blood pressure rises up.  PAD/HLD Aortoiliac atherosclerosis CT scan on admission also noted heavy aortoiliac atherosclerosis, suspected high-grade calcific stenosis in the proximal left superficial femoral artery, and left pelvic venous congestion. Continue Lipitor, ASA, Plavix   Chronic smoker Emphysema Emphysema noted in CT scan.   Respiratory status stable at this time Counseled about smoking.  BPH Finasteride, Flomax   Liver lesions  CT abdomen noted incidental findings of several subcentimeter low-density liver lesions.  Follow-up MRI in 3-6 months as an outpatient.  Goals of care   Code Status: Full Code    Mobility: Encourage ambulation Skin assessment:     Nutritional status:  Body mass index is 19.29 kg/m.          Diet:  Diet Order             Diet clear liquid Room service appropriate? Yes; Fluid consistency: Thin  Diet effective now                   DVT prophylaxis:  enoxaparin (LOVENOX) injection 40 mg Start: 02/03/22 1000   Antimicrobials: None Fluid: LR at 100 mill per hour Consultants: None Family Communication: None at bedside  Status is: Observation  Continue in-hospital care because: Needs IV fluid, IV pain meds, inpatient monitoring for pancreatitis Level of care: Med-Surg   Dispo:  The patient is from: Home              Anticipated d/c is to: Home in 2 to 3 days              Patient currently is not medically stable to d/c.   Difficult to place patient No     Infusions:   lactated ringers 100 mL/hr at 02/03/22 1022    Scheduled Meds:  aspirin EC  81 mg Oral Daily   atorvastatin  80 mg Oral q1800   clopidogrel  75 mg Oral Q breakfast   enoxaparin  (LOVENOX) injection  40 mg Subcutaneous Daily   finasteride  5 mg Oral Daily   folic acid  1 mg Oral Daily   LORazepam  0-4 mg Intravenous Q6H   Followed by   Derrill Memo ON 02/05/2022] LORazepam  0-4 mg Intravenous Q12H   multivitamin with minerals  1 tablet Oral Daily   pantoprazole (PROTONIX) IV  40 mg Intravenous Daily   tamsulosin  0.8 mg Oral Daily   thiamine  100 mg Oral Daily   Or   thiamine  100 mg Intravenous Daily    PRN meds: acetaminophen **OR** acetaminophen, HYDROmorphone (DILAUDID) injection, LORazepam **OR** LORazepam, ondansetron **OR** ondansetron (ZOFRAN) IV, oxyCODONE   Antimicrobials: Anti-infectives (From admission, onward)    None       Objective: Vitals:   02/03/22 0802 02/03/22 1000  BP:  126/73  Pulse:  62  Resp:  11  Temp: 97.8 F (36.6 C)   SpO2:  97%    Intake/Output Summary (Last 24 hours) at 02/03/2022 1112 Last data filed at 02/03/2022 1034 Gross per 24 hour  Intake 2000 ml  Output 900 ml  Net 1100 ml   Filed Weights   02/03/22 0417  Weight: 56.7 kg   Weight change:  Body mass index is 19.29 kg/m.   Physical Exam: General exam: Pleasant, elderly African-American male.  Not in physical distress Skin: No rashes, lesions or ulcers. HEENT: Atraumatic, normocephalic, no obvious bleeding Lungs: Clear to auscultation bilaterally CVS: Regular rate and rhythm, no murmur GI/Abd soft, mild generalized discomfort and tenderness, nondistended, bowel sound present CNS: Alert, awake, oriented x3 Psychiatry: Mood appropriate Extremities: No pedal edema, no calf tenderness  Data Review: I have personally reviewed the laboratory data and studies available.  F/u labs ordered Unresulted Labs (From admission, onward)     Start     Ordered   02/09/22 0500  Creatinine, serum  (enoxaparin (LOVENOX)    CrCl >/= 30 ml/min)  Weekly,   R     Comments: while on enoxaparin therapy    02/02/22 2313   02/04/22 0500  Lipase, blood  Daily at 5am,   R       02/03/22 0834   02/03/22 0834  Lipase, blood  Add-on,   AD        02/03/22 0834   02/03/22 0500  Comprehensive metabolic panel  Daily at 5am,   R      02/02/22 2313   02/03/22 0500  CBC  Daily at 5am,   R      02/02/22 2313            Signed, Terrilee Croak, MD [facility] 02/03/2022

## 2022-02-04 LAB — COMPREHENSIVE METABOLIC PANEL
ALT: 16 U/L (ref 0–44)
AST: 25 U/L (ref 15–41)
Albumin: 3.2 g/dL — ABNORMAL LOW (ref 3.5–5.0)
Alkaline Phosphatase: 61 U/L (ref 38–126)
Anion gap: 11 (ref 5–15)
BUN: <5 mg/dL — ABNORMAL LOW (ref 8–23)
CO2: 27 mmol/L (ref 22–32)
Calcium: 8.6 mg/dL — ABNORMAL LOW (ref 8.9–10.3)
Chloride: 97 mmol/L — ABNORMAL LOW (ref 98–111)
Creatinine, Ser: 0.89 mg/dL (ref 0.61–1.24)
GFR, Estimated: >60 mL/min (ref >60)
Glucose, Bld: 126 mg/dL — ABNORMAL HIGH (ref 70–99)
Potassium: 3.1 mmol/L — ABNORMAL LOW (ref 3.5–5.1)
Sodium: 135 mmol/L (ref 135–145)
Total Bilirubin: 0.8 mg/dL (ref 0.3–1.2)
Total Protein: 6.2 g/dL — ABNORMAL LOW (ref 6.5–8.1)

## 2022-02-04 LAB — CBC
HCT: 32.7 % — ABNORMAL LOW (ref 39.0–52.0)
Hemoglobin: 11.1 g/dL — ABNORMAL LOW (ref 13.0–17.0)
MCH: 31.9 pg (ref 26.0–34.0)
MCHC: 33.9 g/dL (ref 30.0–36.0)
MCV: 94 fL (ref 80.0–100.0)
Platelets: 127 10*3/uL — ABNORMAL LOW (ref 150–400)
RBC: 3.48 MIL/uL — ABNORMAL LOW (ref 4.22–5.81)
RDW: 13.4 % (ref 11.5–15.5)
WBC: 8.2 10*3/uL (ref 4.0–10.5)
nRBC: 0 % (ref 0.0–0.2)

## 2022-02-04 LAB — LIPASE, BLOOD: Lipase: 36 U/L (ref 11–51)

## 2022-02-04 MED ORDER — POLYETHYLENE GLYCOL 3350 17 G PO PACK
17.0000 g | PACK | Freq: Every day | ORAL | Status: DC | PRN
  Administered 2022-02-04: 17 g via ORAL
  Filled 2022-02-04: qty 1

## 2022-02-04 MED ORDER — OXYCODONE HCL 5 MG PO TABS: 5.0000 mg | ORAL_TABLET | Freq: Four times a day (QID) | ORAL | 0 refills | Status: AC | PRN

## 2022-02-04 MED ORDER — OMEPRAZOLE MAGNESIUM 20 MG PO TBEC: 20.0000 mg | DELAYED_RELEASE_TABLET | Freq: Every day | ORAL | 2 refills | Status: AC

## 2022-02-04 NOTE — Progress Notes (Signed)
Explained discharge instructions to patient. Reviewed follow up appointment and next medication administration times. Also reviewed education. Patient verbalized having an understanding for instructions given. All belongings are in the patient's possession to include his clothing, cell phone, charger, and eye glasses. IV was removed. . No other needs verbalized. Transported downstairs to the discharge lounge to await his ride.

## 2022-02-04 NOTE — Discharge Summary (Signed)
Physician Discharge Summary  Nathan KUENNEN Sr. UVO:[id number] DOB: 08/20/1950 DOA: 02/02/2022  PCP: Charlsie Merles, MD  Admit date: 02/02/2022 Discharge date: 02/04/2022  Admitted From: Home Discharge disposition: Home  Recommendations at discharge:  Stop alcohol.  Stop smoking You been started on Prilosec daily because of gastritis, duodenitis. CT abdomen on admission noted incidental findings of several subcentimeter low-density liver lesions.  Follow-up MRI in 3-6 months as an outpatient with your primary care provider.  Brief narrative: Nathan LUST Sr. is a 71 y.o. male with PMH significant for chronic heavy alcohol use, HTN, HLD, PAD, arthritis 10/21, patient presented to the ED with complaint of several days of abdominal pain, nausea, nonbloody vomiting.  Worsening in severity for a week.  He drinks a 40 ounce beer daily. In the ED, patient had a temperature of 99.6, heart rate 80s, blood pressure in normal range Labs with remarkable CBC, BMP with lipase level elevated to 885. 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 Patient was given IV fluid, IV morphine, Zofran.  Admitted to [facility] for pancreatitis  Subjective: Patient was seen and examined this morning.  Pleasant elderly African-American male.  Lying on bed.  Able to tolerate full liquid diet today.  No vomiting since he came in.  Abdominal pain improving.  Lipase level normalized.   No BM in 3 days.  Given MiraLAX this morning.    Hospital course: Acute on chronic pancreatitis  Presented with progressively worsening abdomen pain, nausea, vomiting in the setting of chronic alcoholism  Lipase level elevated, LFTs normal as below. CT abdomen and pelvis finding as above Triglyceride levels not elevated. Patient was started on conservative management with IV fluid, IV analgesia, IV antiemetics, bowel  rest. Symptoms improved.  Lipase level improved.  He was gradually started on clear liquid diet and advance to full liquid diet.  Able to tolerate full liquid diet.  Discharge home today to continue for liquid diet for the next 2 to 3 days before advancing to soft diet. Recent Labs  Lab 02/02/22 1408 02/03/22 0228 02/04/22 0225  AST 50* 30 25  ALT 25 20 16   ALKPHOS 91 68 61  BILITOT 0.5 0.8 0.8  PROT 7.8 6.7 6.2*  ALBUMIN 4.2 3.6 3.2*  LIPASE 885* 143* 36  PLT 171 147* 127*   Chronic alcoholism Counseled to quit.   He did not have any withdrawal symptoms in the hospital.  Alcoholic gastritis/duodenitis Pain abdomen could also be secondary to gastritis/duodenitis because of alcohol use.  CT abdomen noted moderate thickening of the stomach and duodenum. He has been started on PPI.  Counseled extensively to quit alcohol.  Essential hypertension PTA on amlodipine 5 mg daily.  Resume the same post discharge.  PAD/HLD Aortoiliac atherosclerosis CT scan on admission also noted heavy aortoiliac atherosclerosis, suspected high-grade calcific stenosis in the proximal left superficial femoral artery, and left pelvic venous congestion. Continue Lipitor, ASA, Plavix   Chronic smoker Emphysema Emphysema noted in CT scan.   Respiratory status stable at this time Counseled about smoking.  BPH Finasteride, Flomax   Liver lesions  CT abdomen noted incidental findings of several subcentimeter low-density liver lesions.  Follow-up MRI in 3-6 months as an outpatient.  Wounds:  - Incision (Closed) 01/07/18 Groin Right (Active)  Date First Assessed/Time First Assessed: 01/07/18 1445   Location: Groin  Location Orientation: Right    Assessments 01/07/2018  3:30  PM 01/09/2018  8:00 AM  Dressing Type Liquid skin adhesive Liquid skin adhesive  Dressing Clean;Dry;Intact Clean;Dry;Intact  Site / Wound Assessment -- Clean;Dry  Margins -- Attached edges (approximated)  Closure -- Skin glue   Drainage Amount -- None     No associated orders.    Discharge Exam:   Vitals:   02/03/22 1958 02/04/22 0018 02/04/22 0606 02/04/22 0842  BP: 109/71 109/72 102/83 115/69  Pulse: 77 81 82 (!) 58  Resp: 16 18 18 18   Temp: 99.3 F (37.4 C) 98.9 F (37.2 C) 98.9 F (37.2 C) 98.4 F (36.9 C)  TempSrc: Oral Oral Oral Oral  SpO2: 96% 96% 97% 98%  Weight:      Height:        Body mass index is 19.29 kg/m.  General exam: Pleasant, elderly African-American male.  Not in physical distress. Skin: No rashes, lesions or ulcers. HEENT: Atraumatic, normocephalic, no obvious bleeding Lungs: Clear to auscultation bilaterally CVS: Regular rate and rhythm, no murmur GI/Abd soft, improving abdominal tenderness, nondistended, bowel sound present CNS: Alert, awake, oriented x3 Psychiatry: Mood appropriate Extremities: No pedal edema, no calf tenderness  Follow ups:    Follow-up Information     Borum, , MD Follow up.   Specialty: Internal Medicine Contact information: [phone] [facility] MEDICAL [facility] [place] [address]                 Discharge Instructions:   Discharge Instructions     Call MD for:  difficulty breathing, headache or visual disturbances   Complete by: As directed    Call MD for:  extreme fatigue   Complete by: As directed    Call MD for:  hives   Complete by: As directed    Call MD for:  persistant dizziness or light-headedness   Complete by: As directed    Call MD for:  persistant nausea and vomiting   Complete by: As directed    Call MD for:  severe uncontrolled pain   Complete by: As directed    Call MD for:  temperature >100.4   Complete by: As directed    Diet general   Complete by: As directed    Discharge instructions   Complete by: As directed    Recommendations at discharge:   Stop alcohol.  Stop smoking  You been started on Prilosec daily because of gastritis, duodenitis.  CT abdomen on admission noted  incidental findings of several subcentimeter low-density liver lesions.  Follow-up MRI in 3-6 months as an outpatient with your primary care provider.  General discharge instructions: Follow with Primary MD Borum, [phone], MD in 7 days  Please request your PCP  to go over your hospital tests, procedures, radiology results at the follow up. Please get your medicines reviewed and adjusted.  Your PCP may decide to repeat certain labs or tests as needed. Do not drive, operate heavy machinery, perform activities at heights, swimming or participation in water activities or provide baby sitting services if your were admitted for syncope or siezures until you have seen by Primary MD or a Neurologist and advised to do so again. [place] Controlled Substance Reporting System database was reviewed. Do not drive, operate heavy machinery, perform activities at heights, swim, participate in water activities or provide baby-sitting services while on medications for pain, sleep and mood until your outpatient physician has reevaluated you and advised to do so again.  You are strongly recommended to comply with the dose, frequency  and duration of prescribed medications. Activity: As tolerated with Full fall precautions use walker/cane & assistance as needed Avoid using any recreational substances like cigarette, tobacco, alcohol, or non-prescribed drug. If you experience worsening of your admission symptoms, develop shortness of breath, life threatening emergency, suicidal or homicidal thoughts you must seek medical attention immediately by calling 911 or calling your MD immediately  if symptoms less severe. You must read complete instructions/literature along with all the possible adverse reactions/side effects for all the medicines you take and that have been prescribed to you. Take any new medicine only after you have completely understood and accepted all the possible adverse reactions/side effects.  Wear  Seat belts while driving. You were cared for by a hospitalist during your hospital stay. If you have any questions about your discharge medications or the care you received while you were in the hospital after you are discharged, you can call the unit and ask to speak with the hospitalist or the covering physician. Once you are discharged, your primary care physician will handle any further medical issues. Please note that NO REFILLS for any discharge medications will be authorized once you are discharged, as it is imperative that you return to your primary care physician (or establish a relationship with a primary care physician if you do not have one).   Increase activity slowly   Complete by: As directed        Discharge Medications:   Allergies as of 02/04/2022   No Known Allergies      Medication List     TAKE these medications    acetaminophen 500 MG tablet Commonly known as: TYLENOL Take 500-1,000 mg by mouth 3 (three) times daily as needed for mild pain, headache or moderate pain.   allopurinol 300 MG tablet Commonly known as: ZYLOPRIM Take 300 mg by mouth daily.   amLODipine 5 MG tablet Commonly known as: NORVASC Take 5 mg by mouth daily.   aspirin EC 81 MG tablet Take 81 mg by mouth daily.   atorvastatin 80 MG tablet Commonly known as: LIPITOR Take 1 tablet (80 mg total) by mouth daily at 6 PM.   buPROPion 150 MG 12 hr tablet Commonly known as: ZYBAN Take 150 mg by mouth every other day.   clopidogrel 75 MG tablet Commonly known as: PLAVIX Take 1 tablet (75 mg total) by mouth daily with breakfast.   diclofenac Sodium 1 % Gel Commonly known as: VOLTAREN Apply 2 g topically 4 (four) times daily as needed (pain).   finasteride 5 MG tablet Commonly known as: PROSCAR Take 5 mg by mouth daily.   latanoprost 0.005 % ophthalmic solution Commonly known as: XALATAN Place 1 drop into both eyes at bedtime.   nicotine polacrilex 2 MG lozenge Commonly known as:  COMMIT Take 2 mg by mouth as needed for smoking cessation.   omeprazole 20 MG tablet Commonly known as: PriLOSEC OTC Take 1 tablet (20 mg total) by mouth daily.   oxyCODONE 5 MG immediate release tablet Commonly known as: Oxy IR/ROXICODONE Take 1 tablet (5 mg total) by mouth every 6 (six) hours as needed for up to 7 days for moderate pain.   tamsulosin 0.4 MG Caps capsule Commonly known as: FLOMAX Take 0.8 mg by mouth daily.         The results of significant diagnostics from this hospitalization (including imaging, microbiology, ancillary and laboratory) are listed below for reference.    Procedures and Diagnostic Studies:   CT ABDOMEN PELVIS W  CONTRAST  Result Date: 02/02/2022 CLINICAL DATA:  Abdominal pain with suspected acute pancreatitis, pain exacerbated with eating. Nausea and vomiting also. EXAM: CT ABDOMEN AND PELVIS WITH CONTRAST TECHNIQUE: Multidetector CT imaging of the abdomen and pelvis was performed using the standard protocol following bolus administration of intravenous contrast. RADIATION DOSE REDUCTION: This exam was performed according to the departmental dose-optimization program which includes automated exposure control, adjustment of the mA and/or kV according to patient size and/or use of iterative reconstruction technique. CONTRAST:  16mL OMNIPAQUE IOHEXOL 350 MG/ML SOLN COMPARISON:  None Available. FINDINGS: Lower chest: Lung bases show emphysematous and mild scarring changes with asymmetric right lower lobe bullous disease. The cardiac size is normal. Hepatobiliary: The liver 16 cm length with mild steatosis. There are several scattered subcentimeter hypodense lesions in the liver which are too small to characterize. Gallbladder and bile ducts are unremarkable. Pancreas: There are scattered coarse calcifications in the pancreas consistent with chronic calcific pancreatitis. There is mild peripancreatic edema alongside the head, neck and proximal body sections  consistent with acute on chronic pancreatitis. There is no pancreatic ductal dilatation, mass enhancement, or hypoenhancement suspicious for necrosis. There is no peripancreatic hemorrhage with only minimal nonlocalizing proximal peripancreatic fluid. Spleen: Normal. Adrenals/Urinary Tract: There is no adrenal mass. There are a few subcentimeter left renal hypodense lesions which are too small to characterize. Right renal cortex is unremarkable. No follow-up imaging is recommended. For reference see JACR 2018 Feb; 264-273, Management of the Incidental RenalMass on CT, [employer] 2021; 814-848, Bosniak Classification of Cystic Renal Masses, Version 2019. There are renovascular calcifications along side both renal hila. There are no caliceal stones or obstructing stones or hydronephrosis either side. The bladder thickness is normal. Stomach/Bowel: There are moderate thickened folds in the mid to distal stomach, descending duodenal. Rest of the small bowel is unremarkable. There is contrast in the distal ileum. Contrast in the ascending colon. Uncomplicated colonic diverticulosis. Normal caliber appendix. Vascular/Lymphatic: The abdominal aorta and iliac arteries are heavily calcified. There are moderate to heavy calcifications in the proximal renal arteries. No adenopathy is seen. There is left pelvic venous congestion. There appears to be a high-grade calcific stenosis in the proximal left superficial femoral artery. Reproductive: Unremarkable prostate. Other: There is no pelvic ascites, only minimal fluid in the region of the pancreas. There is no free air, free hemorrhage or abscess. There is no incarcerated hernia. Small umbilical fat hernia. There is a tiny left inguinal fat hernia. Musculoskeletal: Osteopenia and degenerative change lumbar spine. Ankylosis left SI joint. No concerning regional bone lesion. IMPRESSION: 1. 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 Laboratory and clinical correlation suggested. 2. Small amount of nonlocalizing reactive fluid. No abscess, ductal dilatation, mass enhancement or focal hypoenhancement. 3. Moderate thickening in the stomach and duodenum, which could be due to gastroenteritis or reactive from the pancreatitis. 4. Uncomplicated colonic diverticulosis. 5. Several subcentimeter hepatic low-density lesions which are too small to characterize. If there is no known history of cancer or known hepatic risk factors in this patient, no follow-up imaging is required. If there are hepatic risk factors or malignancy history, 3-six-month follow-up MRI is recommended. 6. Heavy aortoiliac atherosclerosis, suspected high-grade calcific stenosis in the proximal left superficial femoral artery, and left pelvic venous congestion. 7. Emphysema. Electronically Signed   By: Almira Bar M.D.   On: 02/02/2022 22:41     Labs:   Basic Metabolic Panel: Recent Labs  Lab  02/02/22 1408 02/03/22 0228 02/04/22 0225  NA 136 136 135  K 4.0 3.6 3.1*  CL 100 102 97*  CO2 22 23 27   GLUCOSE 120* 118* 126*  BUN 13 10 <5*  CREATININE 1.22 1.00 0.89  CALCIUM 9.3 8.8* 8.6*  MG  --  2.2  --   PHOS  --  3.8  --    GFR Estimated Creatinine Clearance: 61.1 mL/min (by C-G formula based on SCr of 0.89 mg/dL). Liver Function Tests: Recent Labs  Lab 02/02/22 1408 02/03/22 0228 02/04/22 0225  AST 50* 30 25  ALT 25 20 16   ALKPHOS 91 68 61  BILITOT 0.5 0.8 0.8  PROT 7.8 6.7 6.2*  ALBUMIN 4.2 3.6 3.2*   Recent Labs  Lab 02/02/22 1408 02/03/22 0228 02/04/22 0225  LIPASE 885* 143* 36   No results for input(s): "AMMONIA" in the last 168 hours. Coagulation profile No results for input(s): "INR", "PROTIME" in the last 168 hours.  CBC: Recent Labs  Lab 02/02/22 1408 02/03/22 0228 02/04/22 0225  WBC 10.5 9.6 8.2  HGB 13.5 12.0* 11.1*  HCT 40.1 34.7* 32.7*  MCV 95.2 93.3 94.0  PLT 171 147* 127*    Cardiac Enzymes: No results for input(s): "CKTOTAL", "CKMB", "CKMBINDEX", "TROPONINI" in the last 168 hours. BNP: Invalid input(s): "POCBNP" CBG: No results for input(s): "GLUCAP" in the last 168 hours. D-Dimer No results for input(s): "DDIMER" in the last 72 hours. Hgb A1c No results for input(s): "HGBA1C" in the last 72 hours. Lipid Profile Recent Labs    02/03/22 0228  TRIG 88   Thyroid function studies No results for input(s): "TSH", "T4TOTAL", "T3FREE", "THYROIDAB" in the last 72 hours.  Invalid input(s): "FREET3" Anemia work up No results for input(s): "VITAMINB12", "FOLATE", "FERRITIN", "TIBC", "IRON", "RETICCTPCT" in the last 72 hours. Microbiology No results found for this or any previous visit (from the past 240 hour(s)).  Time coordinating discharge: 35 minutes  Signed: Royalty Domagala  [facility] 02/04/2022, 11:23 AM

## 2022-02-04 NOTE — TOC CAGE-AID Note (Signed)
Transition of Care [facility]) - CAGE-AID Screening   Patient Details  Name: Nathan BRINGHURST Sr. MRN: [id number] Date of Birth: 28-Mar-1951  Transition of Care [facility]) CM/SW Contact:    Reece Agar, Latanya Presser Phone Number: 02/04/2022, 10:29 AM   Clinical Narrative: CSW spoke with pt about substance use pt stated he drinks about 40oz a day but is trying to quit drinking.  CSW offered detox resources pt refused stating he quit using crack about 12 years ago on his own and wants to try with alcohol. Pt also stated he uses mariajuana and smokes cigarettes, he is trying to cut back on cigarettes but has no intentions of quitting marijuana. CSW is signing off.      CAGE-AID Screening:    Have You Ever Felt You Ought to Cut Down on Your Drinking or Drug Use?: Yes Have People Annoyed You By Critizing Your Drinking Or Drug Use?: No Have You Felt Bad Or Guilty About Your Drinking Or Drug Use?: No Have You Ever Had a Drink or Used Drugs First Thing In The Morning to Steady Your Nerves or to Get Rid of a Hangover?: Yes CAGE-AID Score: 2  Substance Abuse Education Offered: Yes

## 2022-02-04 NOTE — Plan of Care (Signed)

## 2022-08-11 ENCOUNTER — Inpatient Hospital Stay (HOSPITAL_COMMUNITY)
Admission: EM | Admit: 2022-08-11 | Discharge: 2022-08-15 | DRG: 190 | Disposition: A | Discharge status: Home / Self Care | Payer: No Typology Code available for payment source | Attending: Family Medicine | Admitting: Family Medicine

## 2022-08-11 ENCOUNTER — Ambulatory Visit
Admission: EM | Admit: 2022-08-11 | Discharge: 2022-08-11 | Disposition: A | Discharge status: Home / Self Care | Payer: No Typology Code available for payment source | Attending: Internal Medicine | Admitting: Internal Medicine

## 2022-08-11 ENCOUNTER — Other Ambulatory Visit: Payer: Self-pay

## 2022-08-11 ENCOUNTER — Emergency Department (HOSPITAL_COMMUNITY): Payer: No Typology Code available for payment source

## 2022-08-11 DIAGNOSIS — R64 Cachexia: Secondary | ICD-10-CM | POA: Diagnosis present

## 2022-08-11 DIAGNOSIS — N400 Enlarged prostate without lower urinary tract symptoms: Secondary | ICD-10-CM | POA: Diagnosis present

## 2022-08-11 DIAGNOSIS — I10 Essential (primary) hypertension: Secondary | ICD-10-CM | POA: Diagnosis present

## 2022-08-11 DIAGNOSIS — K769 Liver disease, unspecified: Secondary | ICD-10-CM | POA: Diagnosis present

## 2022-08-11 DIAGNOSIS — K219 Gastro-esophageal reflux disease without esophagitis: Secondary | ICD-10-CM | POA: Diagnosis present

## 2022-08-11 DIAGNOSIS — I739 Peripheral vascular disease, unspecified: Secondary | ICD-10-CM | POA: Diagnosis present

## 2022-08-11 DIAGNOSIS — F1010 Alcohol abuse, uncomplicated: Secondary | ICD-10-CM | POA: Diagnosis not present

## 2022-08-11 DIAGNOSIS — D7219 Other eosinophilia: Secondary | ICD-10-CM | POA: Diagnosis present

## 2022-08-11 DIAGNOSIS — D721 Eosinophilia, unspecified: Secondary | ICD-10-CM | POA: Diagnosis not present

## 2022-08-11 DIAGNOSIS — J441 Chronic obstructive pulmonary disease with (acute) exacerbation: Secondary | ICD-10-CM | POA: Diagnosis present

## 2022-08-11 DIAGNOSIS — H409 Unspecified glaucoma: Secondary | ICD-10-CM | POA: Diagnosis present

## 2022-08-11 DIAGNOSIS — Z1152 Encounter for screening for COVID-19: Secondary | ICD-10-CM

## 2022-08-11 DIAGNOSIS — I2510 Atherosclerotic heart disease of native coronary artery without angina pectoris: Secondary | ICD-10-CM | POA: Diagnosis present

## 2022-08-11 DIAGNOSIS — B449 Aspergillosis, unspecified: Secondary | ICD-10-CM | POA: Diagnosis present

## 2022-08-11 DIAGNOSIS — Z95828 Presence of other vascular implants and grafts: Secondary | ICD-10-CM

## 2022-08-11 DIAGNOSIS — J9601 Acute respiratory failure with hypoxia: Secondary | ICD-10-CM

## 2022-08-11 DIAGNOSIS — F17210 Nicotine dependence, cigarettes, uncomplicated: Secondary | ICD-10-CM | POA: Diagnosis present

## 2022-08-11 DIAGNOSIS — Z7902 Long term (current) use of antithrombotics/antiplatelets: Secondary | ICD-10-CM

## 2022-08-11 DIAGNOSIS — E440 Moderate protein-calorie malnutrition: Secondary | ICD-10-CM | POA: Insufficient documentation

## 2022-08-11 DIAGNOSIS — J440 Chronic obstructive pulmonary disease with acute lower respiratory infection: Secondary | ICD-10-CM | POA: Diagnosis not present

## 2022-08-11 DIAGNOSIS — E782 Mixed hyperlipidemia: Secondary | ICD-10-CM

## 2022-08-11 DIAGNOSIS — J209 Acute bronchitis, unspecified: Secondary | ICD-10-CM | POA: Diagnosis present

## 2022-08-11 DIAGNOSIS — Z79899 Other long term (current) drug therapy: Secondary | ICD-10-CM

## 2022-08-11 DIAGNOSIS — R0902 Hypoxemia: Secondary | ICD-10-CM

## 2022-08-11 DIAGNOSIS — E785 Hyperlipidemia, unspecified: Secondary | ICD-10-CM | POA: Diagnosis present

## 2022-08-11 DIAGNOSIS — K861 Other chronic pancreatitis: Secondary | ICD-10-CM | POA: Diagnosis present

## 2022-08-11 DIAGNOSIS — Z7982 Long term (current) use of aspirin: Secondary | ICD-10-CM

## 2022-08-11 DIAGNOSIS — M1990 Unspecified osteoarthritis, unspecified site: Secondary | ICD-10-CM | POA: Diagnosis present

## 2022-08-11 DIAGNOSIS — J439 Emphysema, unspecified: Secondary | ICD-10-CM | POA: Diagnosis present

## 2022-08-11 DIAGNOSIS — I959 Hypotension, unspecified: Secondary | ICD-10-CM | POA: Diagnosis present

## 2022-08-11 DIAGNOSIS — Z716 Tobacco abuse counseling: Secondary | ICD-10-CM

## 2022-08-11 DIAGNOSIS — Z720 Tobacco use: Secondary | ICD-10-CM | POA: Diagnosis present

## 2022-08-11 DIAGNOSIS — Z6820 Body mass index (BMI) 20.0-20.9, adult: Secondary | ICD-10-CM

## 2022-08-11 DIAGNOSIS — R571 Hypovolemic shock: Secondary | ICD-10-CM

## 2022-08-11 LAB — CBC WITH DIFFERENTIAL/PLATELET
Abs Immature Granulocytes: 0 10*3/uL (ref 0.00–0.07)
Band Neutrophils: 2 %
Basophils Absolute: 0.3 10*3/uL — ABNORMAL HIGH (ref 0.0–0.1)
Basophils Relative: 2 %
Eosinophils Absolute: 10.5 10*3/uL — ABNORMAL HIGH (ref 0.0–0.5)
Eosinophils Relative: 61 %
HCT: 34.4 % — ABNORMAL LOW (ref 39.0–52.0)
Hemoglobin: 11.6 g/dL — ABNORMAL LOW (ref 13.0–17.0)
Lymphocytes Relative: 16 %
Lymphs Abs: 2.8 10*3/uL (ref 0.7–4.0)
MCH: 32 pg (ref 26.0–34.0)
MCHC: 33.7 g/dL (ref 30.0–36.0)
MCV: 95 fL (ref 80.0–100.0)
Monocytes Absolute: 0.3 10*3/uL (ref 0.1–1.0)
Monocytes Relative: 2 %
Neutro Abs: 3.3 10*3/uL (ref 1.7–7.7)
Neutrophils Relative %: 17 %
Platelets: 215 10*3/uL (ref 150–400)
RBC: 3.62 MIL/uL — ABNORMAL LOW (ref 4.22–5.81)
RDW: 14.6 % (ref 11.5–15.5)
WBC: 17.2 10*3/uL — ABNORMAL HIGH (ref 4.0–10.5)
nRBC: 0 % (ref 0.0–0.2)

## 2022-08-11 LAB — SARS CORONAVIRUS 2 BY RT PCR: SARS Coronavirus 2 by RT PCR: NEGATIVE

## 2022-08-11 LAB — COMPREHENSIVE METABOLIC PANEL
ALT: 16 U/L (ref 0–44)
AST: 25 U/L (ref 15–41)
Albumin: 2.9 g/dL — ABNORMAL LOW (ref 3.5–5.0)
Alkaline Phosphatase: 141 U/L — ABNORMAL HIGH (ref 38–126)
Anion gap: 10 (ref 5–15)
BUN: 8 mg/dL (ref 8–23)
CO2: 26 mmol/L (ref 22–32)
Calcium: 7.5 mg/dL — ABNORMAL LOW (ref 8.9–10.3)
Chloride: 101 mmol/L (ref 98–111)
Creatinine, Ser: 1.03 mg/dL (ref 0.61–1.24)
GFR, Estimated: >60 mL/min (ref >60)
Glucose, Bld: 90 mg/dL (ref 70–99)
Potassium: 4 mmol/L (ref 3.5–5.1)
Sodium: 137 mmol/L (ref 135–145)
Total Bilirubin: 0.7 mg/dL (ref 0.3–1.2)
Total Protein: 6.7 g/dL (ref 6.5–8.1)

## 2022-08-11 LAB — PROTIME-INR
INR: 1.1 (ref 0.8–1.2)
Prothrombin Time: 14.2 seconds (ref 11.4–15.2)

## 2022-08-11 LAB — POTASSIUM: Potassium: 3.5 mmol/L (ref 3.5–5.1)

## 2022-08-11 LAB — LACTIC ACID, PLASMA
Lactic Acid, Venous: 1.4 mmol/L (ref 0.5–1.9)
Lactic Acid, Venous: 1.5 mmol/L (ref 0.5–1.9)

## 2022-08-11 LAB — APTT: aPTT: 33 seconds (ref 24–36)

## 2022-08-11 MED ORDER — ACETAMINOPHEN 325 MG PO TABS
650.0000 mg | ORAL_TABLET | Freq: Four times a day (QID) | ORAL | Status: DC | PRN
  Administered 2022-08-12 (×2): 650 mg via ORAL
  Filled 2022-08-11 (×2): qty 2

## 2022-08-11 MED ORDER — METHYLPREDNISOLONE SODIUM SUCC 125 MG IJ SOLR
125.0000 mg | Freq: Once | INTRAMUSCULAR | Status: AC
  Administered 2022-08-11: 125 mg via INTRAVENOUS
  Filled 2022-08-11: qty 2

## 2022-08-11 MED ORDER — MOMETASONE FURO-FORMOTEROL FUM 200-5 MCG/ACT IN AERO
2.0000 | INHALATION_SPRAY | Freq: Two times a day (BID) | RESPIRATORY_TRACT | Status: DC
  Administered 2022-08-12 – 2022-08-15 (×7): 2 via RESPIRATORY_TRACT
  Filled 2022-08-11: qty 8.8

## 2022-08-11 MED ORDER — OMEPRAZOLE 20 MG PO CPDR
20.0000 mg | DELAYED_RELEASE_CAPSULE | Freq: Every day | ORAL | Status: DC
  Administered 2022-08-12 – 2022-08-15 (×4): 20 mg via ORAL
  Filled 2022-08-11 (×4): qty 1

## 2022-08-11 MED ORDER — ACETAMINOPHEN 650 MG RE SUPP: 650.0000 mg | Freq: Four times a day (QID) | RECTAL | Status: DC | PRN

## 2022-08-11 MED ORDER — ADULT MULTIVITAMIN W/MINERALS CH
1.0000 | ORAL_TABLET | Freq: Every day | ORAL | Status: DC
  Administered 2022-08-11 – 2022-08-15 (×5): 1 via ORAL
  Filled 2022-08-11 (×5): qty 1

## 2022-08-11 MED ORDER — LORAZEPAM 2 MG/ML IJ SOLN: 1.0000 mg | INTRAMUSCULAR | Status: AC | PRN

## 2022-08-11 MED ORDER — UMECLIDINIUM BROMIDE 62.5 MCG/ACT IN AEPB
1.0000 | INHALATION_SPRAY | Freq: Every day | RESPIRATORY_TRACT | Status: DC
  Administered 2022-08-12 – 2022-08-15 (×4): 1 via RESPIRATORY_TRACT
  Filled 2022-08-11: qty 7

## 2022-08-11 MED ORDER — BUPROPION HCL ER (SMOKING DET) 150 MG PO TB12
150.0000 mg | ORAL_TABLET | ORAL | Status: DC
  Administered 2022-08-13 – 2022-08-15 (×2): 150 mg via ORAL
  Filled 2022-08-11 (×4): qty 1

## 2022-08-11 MED ORDER — FOLIC ACID 1 MG PO TABS
1.0000 mg | ORAL_TABLET | Freq: Every day | ORAL | Status: DC
  Administered 2022-08-11 – 2022-08-15 (×5): 1 mg via ORAL
  Filled 2022-08-11 (×5): qty 1

## 2022-08-11 MED ORDER — LORAZEPAM 1 MG PO TABS: 1.0000 mg | ORAL_TABLET | ORAL | Status: AC | PRN

## 2022-08-11 MED ORDER — ASPIRIN 81 MG PO TBEC
81.0000 mg | DELAYED_RELEASE_TABLET | Freq: Every day | ORAL | Status: DC
  Administered 2022-08-12 – 2022-08-15 (×4): 81 mg via ORAL
  Filled 2022-08-11 (×4): qty 1

## 2022-08-11 MED ORDER — SALINE SPRAY 0.65 % NA SOLN
1.0000 | NASAL | Status: DC | PRN
  Administered 2022-08-12 – 2022-08-13 (×2): 1 via NASAL
  Filled 2022-08-11: qty 44

## 2022-08-11 MED ORDER — SODIUM CHLORIDE 0.9 % IV BOLUS
1000.0000 mL | Freq: Once | INTRAVENOUS | Status: AC
  Administered 2022-08-11: 1000 mL via INTRAVENOUS

## 2022-08-11 MED ORDER — FINASTERIDE 5 MG PO TABS
5.0000 mg | ORAL_TABLET | Freq: Every day | ORAL | Status: DC
  Administered 2022-08-12 – 2022-08-15 (×4): 5 mg via ORAL
  Filled 2022-08-11 (×4): qty 1

## 2022-08-11 MED ORDER — CLOPIDOGREL BISULFATE 75 MG PO TABS
75.0000 mg | ORAL_TABLET | Freq: Every day | ORAL | Status: DC
  Administered 2022-08-12 – 2022-08-15 (×4): 75 mg via ORAL
  Filled 2022-08-11 (×4): qty 1

## 2022-08-11 MED ORDER — ENOXAPARIN SODIUM 40 MG/0.4ML IJ SOSY
40.0000 mg | PREFILLED_SYRINGE | INTRAMUSCULAR | Status: DC
  Administered 2022-08-12 – 2022-08-14 (×3): 40 mg via SUBCUTANEOUS
  Filled 2022-08-11 (×3): qty 0.4

## 2022-08-11 MED ORDER — ONDANSETRON HCL 4 MG PO TABS: 4.0000 mg | ORAL_TABLET | Freq: Four times a day (QID) | ORAL | Status: DC | PRN

## 2022-08-11 MED ORDER — IPRATROPIUM-ALBUTEROL 0.5-2.5 (3) MG/3ML IN SOLN
3.0000 mL | Freq: Once | RESPIRATORY_TRACT | Status: AC
  Administered 2022-08-11: 3 mL via RESPIRATORY_TRACT
  Filled 2022-08-11: qty 3

## 2022-08-11 MED ORDER — ALBUTEROL SULFATE (2.5 MG/3ML) 0.083% IN NEBU
2.5000 mg | INHALATION_SOLUTION | RESPIRATORY_TRACT | Status: DC | PRN
  Administered 2022-08-12 – 2022-08-13 (×3): 2.5 mg via RESPIRATORY_TRACT
  Filled 2022-08-11 (×3): qty 3

## 2022-08-11 MED ORDER — ALBUTEROL SULFATE (2.5 MG/3ML) 0.083% IN NEBU
2.5000 mg | INHALATION_SOLUTION | Freq: Once | RESPIRATORY_TRACT | Status: AC
  Administered 2022-08-11: 2.5 mg via RESPIRATORY_TRACT
  Filled 2022-08-11: qty 3

## 2022-08-11 MED ORDER — TAMSULOSIN HCL 0.4 MG PO CAPS
0.8000 mg | ORAL_CAPSULE | Freq: Every day | ORAL | Status: DC
  Administered 2022-08-12 – 2022-08-15 (×4): 0.8 mg via ORAL
  Filled 2022-08-11 (×4): qty 2

## 2022-08-11 MED ORDER — SODIUM CHLORIDE 0.9 % IV SOLN
1.0000 g | INTRAVENOUS | Status: DC
  Administered 2022-08-11: 1 g via INTRAVENOUS
  Filled 2022-08-11: qty 10

## 2022-08-11 MED ORDER — DICLOFENAC SODIUM 1 % EX GEL: 2.0000 g | Freq: Four times a day (QID) | CUTANEOUS | Status: DC | PRN

## 2022-08-11 MED ORDER — PREDNISONE 20 MG PO TABS
40.0000 mg | ORAL_TABLET | Freq: Every day | ORAL | Status: DC
  Administered 2022-08-12 – 2022-08-15 (×4): 40 mg via ORAL
  Filled 2022-08-11 (×4): qty 2

## 2022-08-11 MED ORDER — OMEPRAZOLE MAGNESIUM 20 MG PO TBEC: 20.0000 mg | DELAYED_RELEASE_TABLET | Freq: Every day | ORAL | Status: DC

## 2022-08-11 MED ORDER — LATANOPROST 0.005 % OP SOLN
1.0000 [drp] | Freq: Every day | OPHTHALMIC | Status: DC
  Administered 2022-08-11 – 2022-08-14 (×4): 1 [drp] via OPHTHALMIC
  Filled 2022-08-11: qty 2.5

## 2022-08-11 MED ORDER — THIAMINE MONONITRATE 100 MG PO TABS
100.0000 mg | ORAL_TABLET | Freq: Every day | ORAL | Status: DC
  Administered 2022-08-11 – 2022-08-15 (×5): 100 mg via ORAL
  Filled 2022-08-11 (×6): qty 1

## 2022-08-11 MED ORDER — ATORVASTATIN CALCIUM 40 MG PO TABS
80.0000 mg | ORAL_TABLET | Freq: Every day | ORAL | Status: DC
  Administered 2022-08-12 – 2022-08-14 (×3): 80 mg via ORAL
  Filled 2022-08-11 (×3): qty 2

## 2022-08-11 MED ORDER — THIAMINE HCL 100 MG/ML IJ SOLN
100.0000 mg | Freq: Every day | INTRAMUSCULAR | Status: DC
  Filled 2022-08-11 (×3): qty 2

## 2022-08-11 MED ORDER — ALLOPURINOL 300 MG PO TABS
300.0000 mg | ORAL_TABLET | Freq: Every day | ORAL | Status: DC
  Administered 2022-08-12 – 2022-08-15 (×4): 300 mg via ORAL
  Filled 2022-08-11 (×4): qty 1

## 2022-08-11 MED ORDER — NICOTINE 7 MG/24HR TD PT24: 7.0000 mg | MEDICATED_PATCH | Freq: Every day | TRANSDERMAL | Status: DC | PRN

## 2022-08-11 MED ORDER — ONDANSETRON HCL 4 MG/2ML IJ SOLN: 4.0000 mg | Freq: Four times a day (QID) | INTRAMUSCULAR | Status: DC | PRN

## 2022-08-11 MED ORDER — IPRATROPIUM-ALBUTEROL 0.5-2.5 (3) MG/3ML IN SOLN
RESPIRATORY_TRACT | Status: AC
  Filled 2022-08-11: qty 3

## 2022-08-11 NOTE — ED Triage Notes (Signed)
Pt presents with nasal congestion that started 3 days ago & pt stated that his head feel congested.

## 2022-08-11 NOTE — ED Provider Notes (Signed)
[place] EMERGENCY DEPARTMENT AT [facility] Provider Note   CSN: [id number] Arrival date & time: 08/11/22  1655     History  Chief Complaint  Patient presents with   Shortness of Breath   Wheezing    Nathan HAUSEN Sr. is a 72 y.o. male.  HPI 72 year old male with a history of peripheral vascular disease with multiple prior procedures as well as hypertension and tobacco abuse presents with shortness of breath.  He was sent here from urgent care.  He had a blood pressure in the 80s and was also hypoxic at urgent care.  EMS was called and he was started on some fluids.  Patient tells me he has been having 3 days of nasal congestion and cough.  When he coughs really hard he will get some brown sputum and he also has a headache whenever he coughs very hard.  He feels short of breath and does not wear oxygen at baseline.  He does smoke though he has stopped over the last 3 days.  No fevers, chest pain, vomiting.  He was overall weak.  Home Medications Prior to Admission medications   Medication Sig Start Date End Date Taking? Authorizing Provider  acetaminophen (TYLENOL) 500 MG tablet Take 500-1,000 mg by mouth 3 (three) times daily as needed for mild pain, headache or moderate pain.   Yes [provider]  allopurinol (ZYLOPRIM) 300 MG tablet Take 300 mg by mouth daily.   Yes [provider]  amLODipine (NORVASC) 5 MG tablet Take 5 mg by mouth daily.   Yes [provider]  aspirin EC 81 MG tablet Take 81 mg by mouth daily.   Yes [provider]  atorvastatin (LIPITOR) 80 MG tablet Take 1 tablet (80 mg total) by mouth daily at 6 PM. 12/05/17  Yes Bhagat, Bhavinkumar, PA  buPROPion (ZYBAN) 150 MG 12 hr tablet Take 150 mg by mouth every other day.   Yes [provider]  clopidogrel (PLAVIX) 75 MG tablet Take 1 tablet (75 mg total) by mouth daily with breakfast. 12/05/17  Yes Bhagat, Bhavinkumar, PA  diclofenac Sodium (VOLTAREN) 1 % GEL  Apply 2 g topically 4 (four) times daily as needed (pain).   Yes [provider]  finasteride (PROSCAR) 5 MG tablet Take 5 mg by mouth daily.   Yes [provider]  latanoprost (XALATAN) 0.005 % ophthalmic solution Place 1 drop into both eyes at bedtime.   Yes [provider]  omeprazole (PRILOSEC OTC) 20 MG tablet Take 1 tablet (20 mg total) by mouth daily. 02/04/22 08/11/22 Yes Dahal, Melina Schools, MD  tamsulosin (FLOMAX) 0.4 MG CAPS capsule Take 0.8 mg by mouth daily.   Yes [provider]      Allergies    Patient has no known allergies.    Review of Systems   Review of Systems  Constitutional:  Negative for fever.  Respiratory:  Positive for cough and shortness of breath.   Cardiovascular:  Negative for chest pain.  Gastrointestinal:  Negative for abdominal pain and vomiting.  Neurological:  Positive for weakness.    Physical Exam Updated Vital Signs BP 123/73 (BP Location: Left Arm)   Pulse 97   Temp 97.8 F (36.6 C) (Oral)   Resp (!) 21   Ht 5\' 7"  (1.702 m)   Wt 59 kg   SpO2 97%   BMI 20.36 kg/m  Physical Exam Vitals and nursing note reviewed.  Constitutional:      General: He is  not in acute distress.    Appearance: He is well-developed. He is not diaphoretic.  HENT:     Head: Normocephalic and atraumatic.  Cardiovascular:     Rate and Rhythm: Normal rate and regular rhythm.     Heart sounds: Normal heart sounds.  Pulmonary:     Effort: Pulmonary effort is normal. No accessory muscle usage or respiratory distress.     Breath sounds: Wheezing (diffuse, expiratory) present.  Abdominal:     Palpations: Abdomen is soft.     Tenderness: There is no abdominal tenderness.  Skin:    General: Skin is warm and dry.  Neurological:     Mental Status: He is alert.     ED Results / Procedures / Treatments   Labs (all labs ordered are listed, but only abnormal results are displayed) Labs Reviewed  COMPREHENSIVE METABOLIC PANEL -  Abnormal; Notable for the following components:      Result Value   Calcium 7.5 (*)    Albumin 2.9 (*)    Alkaline Phosphatase 141 (*)    All other components within normal limits  CBC WITH DIFFERENTIAL/PLATELET - Abnormal; Notable for the following components:   WBC 17.2 (*)    RBC 3.62 (*)    Hemoglobin 11.6 (*)    HCT 34.4 (*)    Eosinophils Absolute 10.5 (*)    Basophils Absolute 0.3 (*)    All other components within normal limits  SARS CORONAVIRUS 2 BY RT PCR  CULTURE, BLOOD (ROUTINE X 2)  CULTURE, BLOOD (ROUTINE X 2)  RESPIRATORY PANEL BY PCR  EXPECTORATED SPUTUM ASSESSMENT W GRAM STAIN, RFLX TO RESP C  LACTIC ACID, PLASMA  LACTIC ACID, PLASMA  PROTIME-INR  APTT  POTASSIUM  BASIC METABOLIC PANEL  CBC WITH DIFFERENTIAL/PLATELET  TECHNOLOGIST SMEAR REVIEW    EKG EKG Interpretation  Date/Time:  Sunday August 11 2022 18:54:53 EDT Ventricular Rate:  98 PR Interval:  180 QRS Duration: 68 QT Interval:  358 QTC Calculation: 458 R Axis:   78 Text Interpretation: Sinus rhythm Anteroseptal infarct, age indeterminate no significant change since 2023 Confirmed by Pricilla Loveless ([id number]) on 08/11/2022 7:10:49 PM  Radiology DG Chest Port 1 View  Result Date: 08/11/2022 CLINICAL DATA:  Cough. EXAM: PORTABLE CHEST 1 VIEW COMPARISON:  December 03, 2017 FINDINGS: Calcific atherosclerotic disease and tortuosity of the aorta. Cardiomediastinal silhouette is normal. Mediastinal contours appear intact. There is no evidence of focal airspace consolidation, pleural effusion or pneumothorax. Osseous structures are without acute abnormality. Soft tissues are grossly normal. IMPRESSION: No evidence of focal consolidation. Electronically Signed   By: Ted Mcalpine M.D.   On: 08/11/2022 19:01    Procedures Procedures    Medications Ordered in ED Medications  LORazepam (ATIVAN) tablet 1-4 mg (has no administration in time range)    Or  LORazepam (ATIVAN) injection 1-4 mg (has no  administration in time range)  thiamine (VITAMIN B1) tablet 100 mg (100 mg Oral Given 08/11/22 1949)    Or  thiamine (VITAMIN B1) injection 100 mg ( Intravenous See Alternative 08/11/22 1949)  folic acid (FOLVITE) tablet 1 mg (1 mg Oral Given 08/11/22 1949)  multivitamin with minerals tablet 1 tablet (1 tablet Oral Given 08/11/22 1949)  enoxaparin (LOVENOX) injection 40 mg (has no administration in time range)  acetaminophen (TYLENOL) tablet 650 mg (has no administration in time range)    Or  acetaminophen (TYLENOL) suppository 650 mg (has no administration in time range)  ondansetron (ZOFRAN) tablet 4 mg (has no administration  in time range)    Or  ondansetron (ZOFRAN) injection 4 mg (has no administration in time range)  cefTRIAXone (ROCEPHIN) 1 g in sodium chloride 0.9 % 100 mL IVPB (1 g Intravenous New Bag/Given 08/11/22 2212)  predniSONE (DELTASONE) tablet 40 mg (has no administration in time range)  mometasone-formoterol (DULERA) 200-5 MCG/ACT inhaler 2 puff (2 puffs Inhalation Not Given 08/11/22 2043)  albuterol (PROVENTIL) (2.5 MG/3ML) 0.083% nebulizer solution 2.5 mg (has no administration in time range)  umeclidinium bromide (INCRUSE ELLIPTA) 62.5 MCG/ACT 1 puff (1 puff Inhalation Not Given 08/11/22 2044)  clopidogrel (PLAVIX) tablet 75 mg (has no administration in time range)  allopurinol (ZYLOPRIM) tablet 300 mg (has no administration in time range)  aspirin EC tablet 81 mg (has no administration in time range)  atorvastatin (LIPITOR) tablet 80 mg (has no administration in time range)  buPROPion (ZYBAN) 12 hr tablet 150 mg (has no administration in time range)  tamsulosin (FLOMAX) capsule 0.8 mg (has no administration in time range)  latanoprost (XALATAN) 0.005 % ophthalmic solution 1 drop (1 drop Both Eyes Given 08/11/22 2215)  finasteride (PROSCAR) tablet 5 mg (has no administration in time range)  diclofenac Sodium (VOLTAREN) 1 % topical gel 2 g (has no administration in time range)   omeprazole (PRILOSEC) capsule 20 mg (has no administration in time range)  ipratropium-albuterol (DUONEB) 0.5-2.5 (3) MG/3ML nebulizer solution 3 mL (3 mLs Nebulization Given 08/11/22 1820)  sodium chloride 0.9 % bolus 1,000 mL (0 mLs Intravenous Stopped 08/11/22 1917)  methylPREDNISolone sodium succinate (SOLU-MEDROL) 125 mg/2 mL injection 125 mg (125 mg Intravenous Given 08/11/22 1935)  ipratropium-albuterol (DUONEB) 0.5-2.5 (3) MG/3ML nebulizer solution 3 mL (3 mLs Nebulization Given 08/11/22 1942)  albuterol (PROVENTIL) (2.5 MG/3ML) 0.083% nebulizer solution 2.5 mg (2.5 mg Nebulization Given 08/11/22 1936)    ED Course/ Medical Decision Making/ A&P                             Medical Decision Making Amount and/or Complexity of Data Reviewed Labs: ordered.    Details: COVID-negative.  Has a significant leukocytosis that is mostly eosinophils.  Lactate normal. Radiology: ordered and independent interpretation performed.    Details: No pneumonia ECG/medicine tests: ordered and independent interpretation performed.    Details: No acute ischemia  Risk Prescription drug management. Decision regarding hospitalization.   Patient presents with respiratory failure.  He was given albuterol neb without much relief and given another.  I suspect based on his chronic smoking history that he has COPD and to be given steroids as well.  He has a significant leukocytosis but is mostly eosinophils.  He was hypotensive when he first arrived but prior to any treatment it improved and with a normal lactate and no further hypotension I think sepsis is pretty unlikely.  Discussed with Dr. Julian Reil for admission.        Final Clinical Impression(s) / ED Diagnoses Final diagnoses:  Acute respiratory failure with hypoxia (HCC)  Acute bronchitis, unspecified organism    Rx / DC Orders ED Discharge Orders     None         Pricilla Loveless, MD 08/11/22 2228

## 2022-08-11 NOTE — Assessment & Plan Note (Signed)
Hypotensive in ED today, hold home BP meds

## 2022-08-11 NOTE — H&P (Signed)
History and Physical    Patient: Nathan Palmer GNF:[id number] DOB: [birth year] DOA: 08/11/2022 DOS: the patient was seen and examined on 08/11/2022 PCP: Sherwood Gambler, MD  Patient coming from: Home  Chief Complaint:  Chief Complaint  Patient presents with   Shortness of Breath   Wheezing   HPI: Nathan Palmer. is a 72 y.o. male with medical history significant of smoking, EtOH abuse, HTN, HLD, PAD.  Pt in to ED with c/o SOB.  3 day h/o nasal congestion, cough, cough occasionally productive of brown sputum.  Not on O2 at baseline.  No fevers, CP, vomiting.  Went to UC: SBP in 80s at [facility] and hypoxic.  SBP normal in ED, but is requiring 2L O2.    Review of Systems: As mentioned in the history of present illness. All other systems reviewed and are negative. Past Medical History:  Diagnosis Date   Arthritis    Glaucoma    Hyperlipidemia    Hypertension    Peripheral vascular disease [facility])    Past Surgical History:  Procedure Laterality Date   ABDOMINAL AORTAGRAM Left 12/04/2017   ABDOMINAL AORTOGRAM W/LOWER EXTREMITY (N/A) as a surgical intervention    ABDOMINAL AORTOGRAM W/LOWER EXTREMITY N/A 12/04/2017   Procedure: ABDOMINAL AORTOGRAM W/LOWER EXTREMITY;  Surgeon: Runell Gess, MD;  Location: [facility] INVASIVE CV LAB;  Service: Cardiovascular;  Laterality: N/A;   COLONOSCOPY W/ POLYPECTOMY     ENDARTERECTOMY FEMORAL Right 01/07/2018   Procedure: ENDARTERECTOMY FEMORAL RIGHT;  Surgeon: Nada Libman, MD;  Location: [facility];  Service: Vascular;  Laterality: Right;   FEMORAL ENDARTERECTOMY Right 01/07/2018   FOOT SURGERY Bilateral    bone spurs   ILIAC ATHERECTOMY Right 01/07/2018   Procedure: EXTERNAL ILIAC ATHERECTOMY;  Surgeon: Nada Libman, MD;  Location: [facility] OR;  Service: Vascular;  Laterality: Right;   INSERTION OF ILIAC STENT Right 01/07/2018   Procedure: INSERTION OF STENT COMMON AND EXTERNAL ILIAC;  Surgeon: Nada Libman, MD;  Location: [facility] OR;   Service: Vascular;  Laterality: Right;   PERIPHERAL VASCULAR INTERVENTION Left 12/04/2017   Procedure: PERIPHERAL VASCULAR INTERVENTION;  Surgeon: Runell Gess, MD;  Location: [facility] INVASIVE CV LAB;  Service: Cardiovascular;  Laterality: Left;  left external and common   Social History:  reports that he has been smoking cigarettes. He has a 10.00 pack-year smoking history. He has never used smokeless tobacco. He reports current alcohol use. He reports current drug use. Drug: Marijuana.  No Known Allergies  No family history on file.  Prior to Admission medications   Medication Sig Start Date End Date Taking? Authorizing Provider  acetaminophen (TYLENOL) 500 MG tablet Take 500-1,000 mg by mouth 3 (three) times daily as needed for mild pain, headache or moderate pain.   Yes [provider]  allopurinol (ZYLOPRIM) 300 MG tablet Take 300 mg by mouth daily.   Yes [provider]  amLODipine (NORVASC) 5 MG tablet Take 5 mg by mouth daily.   Yes [provider]  aspirin EC 81 MG tablet Take 81 mg by mouth daily.   Yes [provider]  atorvastatin (LIPITOR) 80 MG tablet Take 1 tablet (80 mg total) by mouth daily at 6 PM. 12/05/17  Yes Bhagat, Bhavinkumar, PA  buPROPion (ZYBAN) 150 MG 12 hr tablet Take 150 mg by mouth every other day.   Yes [provider]  clopidogrel (PLAVIX) 75 MG tablet Take 1 tablet (75 mg total) by mouth daily with breakfast. 12/05/17  Yes Bhagat, Bhavinkumar, PA  diclofenac Sodium (VOLTAREN) 1 % GEL Apply 2 g topically 4 (four) times daily as needed (pain).   Yes [provider]  finasteride (PROSCAR) 5 MG tablet Take 5 mg by mouth daily.   Yes [provider]  latanoprost (XALATAN) 0.005 % ophthalmic solution Place 1 drop into both eyes at bedtime.   Yes [provider]  omeprazole (PRILOSEC OTC) 20 MG tablet Take 1 tablet (20 mg total) by mouth daily. 02/04/22 08/11/22 Yes Dahal, Melina Schools, MD  tamsulosin  (FLOMAX) 0.4 MG CAPS capsule Take 0.8 mg by mouth daily.   Yes [provider]    Physical Exam: Vitals:   08/11/22 1710 08/11/22 1713 08/11/22 1716 08/11/22 2000  BP:  115/77  128/76  Pulse:  (!) 103  98  Resp:    13  Temp:  98 F (36.7 C)    TempSrc:  Oral    SpO2: 95% 97%  100%  Weight:   59 kg   Height:   5\' 7"  (1.702 m)    Constitutional: NAD, calm, comfortable Respiratory: Diffuse wheezing Cardiovascular: Regular rate and rhythm, no murmurs / rubs / gallops. No extremity edema. 2+ pedal pulses. No carotid bruits.  Abdomen: no tenderness, no masses palpated. No hepatosplenomegaly. Bowel sounds positive.  Neurologic: CN 2-12 grossly intact. Sensation intact, DTR normal. Strength 5/5 in all 4.  Psychiatric: Normal judgment and insight. Alert and oriented x 3. Normal mood.   Data Reviewed:       Latest Ref Rng & Units 08/11/2022    5:47 PM 02/04/2022    2:25 AM 02/03/2022    2:28 AM  CBC  WBC 4.0 - 10.5 K/uL 17.2  8.2  9.6   Hemoglobin 13.0 - 17.0 g/dL 16.1  09.6  04.5   Hematocrit 39.0 - 52.0 % 34.4  32.7  34.7   Platelets 150 - 400 K/uL 215  127  147    With 10.5k Eosinophils on diff     Latest Ref Rng & Units 08/11/2022    7:18 PM 08/11/2022    5:47 PM 02/04/2022    2:25 AM  CMP  Glucose 70 - 99 mg/dL  90  409   BUN 8 - 23 mg/dL  8  <5   Creatinine 8.11 - 1.24 mg/dL  9.14  7.82   Sodium 956 - 145 mmol/L  137  135   Potassium 3.5 - 5.1 mmol/L 3.5  4.0  3.1   Chloride 98 - 111 mmol/L  101  97   CO2 22 - 32 mmol/L  26  27   Calcium 8.9 - 10.3 mg/dL  7.5  8.6   Total Protein 6.5 - 8.1 g/dL  6.7  6.2   Total Bilirubin 0.3 - 1.2 mg/dL  0.7  0.8   Alkaline Phos 38 - 126 U/L  141  61   AST 15 - 41 U/L  25  25   ALT 0 - 44 U/L  16  16    CXR neg  Assessment and Plan: * Acute bronchitis Suspicious for COPD exacerbation from undiagnosed COPD in a chronic smoker No PNA on X ray Has WBC, but looks like these are eosinophils, see eosinophilia discussion  below. COPD pathway Tele monitor Cont pulse ox Scheduled and PRN nebs Steroids Adult wheeze Rocephin  Eosinophilia Unclear cause.  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 COPD steroids as above Daily CBC with diff Getting pulm consult for AM  given that his organ involvement seems to include lung (based on symptoms)  ETOH abuse CIWA  Liver lesion Judged to be perfusion lesion only on recent MRI.  Tobacco abuse Needs to quit Cont Zyban Is interested in lung cancer screening, but ill defer this to pulm since we are already getting pulm consult in AM due to the eosinophilia.  Hyperlipidemia Cont statin  Essential hypertension Hypotensive in ED today, hold home BP meds      Advance Care Planning:   Code Status: Full Code  Consults: Message sent to PCCM for pulm consult in AM  Family Communication: Family at bedside  Severity of Illness: The appropriate patient status for this patient is OBSERVATION. Observation status is judged to be reasonable and necessary in order to provide the required intensity of service to ensure the patient's safety. The patient's presenting symptoms, physical exam findings, and initial radiographic and laboratory data in the context of their medical condition is felt to place them at decreased risk for further clinical deterioration. Furthermore, it is anticipated that the patient will be medically stable for discharge from the hospital within 2 midnights of admission.   Author: Hillary Bow., DO 08/11/2022 9:00 PM  For on call review [email or web address].

## 2022-08-11 NOTE — ED Notes (Signed)
EMS arrived to transport PT to ED

## 2022-08-11 NOTE — Assessment & Plan Note (Addendum)
Suspicious for COPD exacerbation from undiagnosed COPD in a chronic smoker No PNA on X ray Has WBC, but looks like these are eosinophils, see eosinophilia discussion below. COPD pathway Tele monitor Cont pulse ox Scheduled and PRN nebs Steroids Adult wheeze Rocephin

## 2022-08-11 NOTE — Assessment & Plan Note (Signed)
Cont statin

## 2022-08-11 NOTE — Assessment & Plan Note (Addendum)
Needs to quit Cont Zyban Is interested in lung cancer screening, but ill defer this to pulm since we are already getting pulm consult in AM due to the eosinophilia.

## 2022-08-11 NOTE — Assessment & Plan Note (Signed)
Judged to be perfusion lesion only on recent MRI.

## 2022-08-11 NOTE — Assessment & Plan Note (Addendum)
Unclear cause.  With pulmonary symptoms: Eosinophilic granulomatosis with polyangiitis Nathan Palmer) in differential. COPD steroids as above Daily CBC with diff Getting pulm consult for AM given that his organ involvement seems to include lung (based on symptoms)

## 2022-08-11 NOTE — Assessment & Plan Note (Signed)
CIWA 

## 2022-08-11 NOTE — Discharge Instructions (Addendum)
Need to go to the emergency department for further evaluation and workup.  You have an infection that is causing your blood pressure and you will oxygen to be low.

## 2022-08-11 NOTE — ED Notes (Signed)
NS bolus infusing.

## 2022-08-11 NOTE — ED Notes (Signed)
Pt A/O and speaking in full sentences.

## 2022-08-11 NOTE — ED Triage Notes (Signed)
Pt came via [facility] from [facility] urgent care on [address]. His daughter took him to the UC. He is complaining of SOB and stuffed nose and coughing up brown sputum with cough.   CBG 159 BP 108/72 94% RA 100% w 2% Oxygen  HR108

## 2022-08-12 ENCOUNTER — Inpatient Hospital Stay (HOSPITAL_COMMUNITY): Payer: No Typology Code available for payment source

## 2022-08-12 DIAGNOSIS — Z1152 Encounter for screening for COVID-19: Secondary | ICD-10-CM | POA: Diagnosis not present

## 2022-08-12 DIAGNOSIS — F1010 Alcohol abuse, uncomplicated: Secondary | ICD-10-CM | POA: Diagnosis present

## 2022-08-12 DIAGNOSIS — K861 Other chronic pancreatitis: Secondary | ICD-10-CM | POA: Diagnosis present

## 2022-08-12 DIAGNOSIS — J439 Emphysema, unspecified: Secondary | ICD-10-CM | POA: Diagnosis present

## 2022-08-12 DIAGNOSIS — F17210 Nicotine dependence, cigarettes, uncomplicated: Secondary | ICD-10-CM | POA: Diagnosis present

## 2022-08-12 DIAGNOSIS — M1990 Unspecified osteoarthritis, unspecified site: Secondary | ICD-10-CM | POA: Diagnosis present

## 2022-08-12 DIAGNOSIS — I2510 Atherosclerotic heart disease of native coronary artery without angina pectoris: Secondary | ICD-10-CM | POA: Diagnosis present

## 2022-08-12 DIAGNOSIS — R0902 Hypoxemia: Secondary | ICD-10-CM | POA: Diagnosis not present

## 2022-08-12 DIAGNOSIS — N400 Enlarged prostate without lower urinary tract symptoms: Secondary | ICD-10-CM | POA: Diagnosis present

## 2022-08-12 DIAGNOSIS — J209 Acute bronchitis, unspecified: Secondary | ICD-10-CM | POA: Diagnosis not present

## 2022-08-12 DIAGNOSIS — J9601 Acute respiratory failure with hypoxia: Secondary | ICD-10-CM | POA: Diagnosis present

## 2022-08-12 DIAGNOSIS — I10 Essential (primary) hypertension: Secondary | ICD-10-CM | POA: Diagnosis present

## 2022-08-12 DIAGNOSIS — J440 Chronic obstructive pulmonary disease with acute lower respiratory infection: Secondary | ICD-10-CM | POA: Diagnosis present

## 2022-08-12 DIAGNOSIS — B449 Aspergillosis, unspecified: Secondary | ICD-10-CM | POA: Diagnosis present

## 2022-08-12 DIAGNOSIS — J441 Chronic obstructive pulmonary disease with (acute) exacerbation: Secondary | ICD-10-CM | POA: Diagnosis present

## 2022-08-12 DIAGNOSIS — K219 Gastro-esophageal reflux disease without esophagitis: Secondary | ICD-10-CM | POA: Diagnosis present

## 2022-08-12 DIAGNOSIS — H409 Unspecified glaucoma: Secondary | ICD-10-CM | POA: Diagnosis present

## 2022-08-12 DIAGNOSIS — I959 Hypotension, unspecified: Secondary | ICD-10-CM | POA: Diagnosis present

## 2022-08-12 DIAGNOSIS — E785 Hyperlipidemia, unspecified: Secondary | ICD-10-CM | POA: Diagnosis present

## 2022-08-12 DIAGNOSIS — R64 Cachexia: Secondary | ICD-10-CM | POA: Diagnosis present

## 2022-08-12 DIAGNOSIS — E440 Moderate protein-calorie malnutrition: Secondary | ICD-10-CM | POA: Diagnosis present

## 2022-08-12 DIAGNOSIS — I739 Peripheral vascular disease, unspecified: Secondary | ICD-10-CM | POA: Diagnosis present

## 2022-08-12 DIAGNOSIS — D7219 Other eosinophilia: Secondary | ICD-10-CM | POA: Diagnosis present

## 2022-08-12 DIAGNOSIS — B4481 Allergic bronchopulmonary aspergillosis: Secondary | ICD-10-CM | POA: Diagnosis not present

## 2022-08-12 DIAGNOSIS — Z7982 Long term (current) use of aspirin: Secondary | ICD-10-CM | POA: Diagnosis not present

## 2022-08-12 DIAGNOSIS — K769 Liver disease, unspecified: Secondary | ICD-10-CM | POA: Diagnosis present

## 2022-08-12 LAB — RESPIRATORY PANEL BY PCR

## 2022-08-12 LAB — EXPECTORATED SPUTUM ASSESSMENT W GRAM STAIN, RFLX TO RESP C

## 2022-08-12 LAB — CBC WITH DIFFERENTIAL/PLATELET
Abs Immature Granulocytes: 0.01 10*3/uL (ref 0.00–0.07)
Basophils Absolute: 0 10*3/uL (ref 0.0–0.1)
Basophils Relative: 1 %
Eosinophils Absolute: 0.6 10*3/uL — ABNORMAL HIGH (ref 0.0–0.5)
Eosinophils Relative: 15 %
HCT: 34.6 % — ABNORMAL LOW (ref 39.0–52.0)
Hemoglobin: 11.2 g/dL — ABNORMAL LOW (ref 13.0–17.0)
Immature Granulocytes: 0 %
Lymphocytes Relative: 21 %
Lymphs Abs: 0.8 10*3/uL (ref 0.7–4.0)
MCH: 30.8 pg (ref 26.0–34.0)
MCHC: 32.4 g/dL (ref 30.0–36.0)
MCV: 95.1 fL (ref 80.0–100.0)
Monocytes Absolute: 0.1 10*3/uL (ref 0.1–1.0)
Monocytes Relative: 4 %
Neutro Abs: 2.3 10*3/uL (ref 1.7–7.7)
Neutrophils Relative %: 59 %
Platelets: 214 10*3/uL (ref 150–400)
RBC: 3.64 MIL/uL — ABNORMAL LOW (ref 4.22–5.81)
RDW: 14.5 % (ref 11.5–15.5)
WBC: 3.8 10*3/uL — ABNORMAL LOW (ref 4.0–10.5)
nRBC: 0 % (ref 0.0–0.2)

## 2022-08-12 LAB — BASIC METABOLIC PANEL
Anion gap: 8 (ref 5–15)
BUN: 7 mg/dL — ABNORMAL LOW (ref 8–23)
CO2: 29 mmol/L (ref 22–32)
Calcium: 8.4 mg/dL — ABNORMAL LOW (ref 8.9–10.3)
Chloride: 101 mmol/L (ref 98–111)
Creatinine, Ser: 1.03 mg/dL (ref 0.61–1.24)
GFR, Estimated: >60 mL/min (ref >60)
Glucose, Bld: 151 mg/dL — ABNORMAL HIGH (ref 70–99)
Potassium: 4.5 mmol/L (ref 3.5–5.1)
Sodium: 138 mmol/L (ref 135–145)

## 2022-08-12 LAB — TECHNOLOGIST SMEAR REVIEW

## 2022-08-12 LAB — SEDIMENTATION RATE: Sed Rate: 56 mm/hr — ABNORMAL HIGH (ref 0–16)

## 2022-08-12 LAB — C-REACTIVE PROTEIN: CRP: 0.8 mg/dL (ref <1.0)

## 2022-08-12 MED ORDER — AZITHROMYCIN 250 MG PO TABS
500.0000 mg | ORAL_TABLET | Freq: Every day | ORAL | Status: AC
  Administered 2022-08-12 – 2022-08-15 (×4): 500 mg via ORAL
  Filled 2022-08-12 (×4): qty 2

## 2022-08-12 MED ORDER — GUAIFENESIN-DM 100-10 MG/5ML PO SYRP
5.0000 mL | ORAL_SOLUTION | ORAL | Status: DC | PRN
  Administered 2022-08-12 – 2022-08-14 (×7): 5 mL via ORAL
  Filled 2022-08-12 (×6): qty 10

## 2022-08-12 MED ORDER — LORATADINE 10 MG PO TABS
10.0000 mg | ORAL_TABLET | Freq: Every day | ORAL | Status: DC
  Administered 2022-08-12 – 2022-08-15 (×4): 10 mg via ORAL
  Filled 2022-08-12 (×4): qty 1

## 2022-08-12 MED ORDER — FLUTICASONE PROPIONATE 50 MCG/ACT NA SUSP
1.0000 | Freq: Every day | NASAL | Status: DC
  Administered 2022-08-12 – 2022-08-15 (×4): 1 via NASAL
  Filled 2022-08-12: qty 16

## 2022-08-12 NOTE — Plan of Care (Signed)

## 2022-08-12 NOTE — Plan of Care (Signed)
  Problem: Education: Goal: Knowledge of disease or condition will improve Outcome: Progressing   Problem: Activity: Goal: Ability to tolerate increased activity will improve Outcome: Progressing   Problem: Respiratory: Goal: Ability to maintain a clear airway will improve Outcome: Progressing   Problem: Education: Goal: Knowledge of General Education information will improve Description: Including pain rating scale, medication(s)/side effects and non-pharmacologic comfort measures Outcome: Progressing   

## 2022-08-12 NOTE — TOC Initial Note (Signed)
Transition of Care (TOC) - Initial/Assessment Note    Patient Details  Name: Nathan LINDAHL Sr. MRN: [id number] Date of Birth: 1950-06-03  Transition of Care [facility]) CM/SW Contact:    Lanier Clam, RN Phone Number: 08/12/2022, 10:27 AM  Clinical Narrative:  SA resources added to AVS.                 Expected Discharge Plan: Home/Self Care Barriers to Discharge: Continued Medical Work up   Patient Goals and CMS Choice            Expected Discharge Plan and Services                                              Prior Living Arrangements/Services                       Activities of Daily Living Home Assistive Devices/Equipment: None ADL Screening (condition at time of admission) Patient's cognitive ability adequate to safely complete daily activities?: Yes Is the patient deaf or have difficulty hearing?: Yes Does the patient have difficulty seeing, even when wearing glasses/contacts?: No Does the patient have difficulty concentrating, remembering, or making decisions?: No Patient able to express need for assistance with ADLs?: No Does the patient have difficulty dressing or bathing?: No Independently performs ADLs?: Yes (appropriate for developmental age) Does the patient have difficulty walking or climbing stairs?: No Weakness of Legs: Both Weakness of Arms/Hands: None  Permission Sought/Granted                  Emotional Assessment              Admission diagnosis:  Acute bronchitis [J20.9] Patient Active Problem List   Diagnosis Date Noted   ETOH abuse 08/11/2022   Acute bronchitis 08/11/2022   Eosinophilia 08/11/2022   Acute respiratory failure with hypoxia (HCC) 08/11/2022   Acute on chronic pancreatitis (HCC) 02/02/2022   Liver lesion 02/02/2022   PAD (peripheral artery disease) (HCC) 01/07/2018   Claudication in peripheral vascular disease (HCC) 11/14/2017   Essential hypertension 11/14/2017   Hyperlipidemia 11/14/2017    Tobacco abuse 11/14/2017   PCP:  Sherwood Gambler, MD Pharmacy:   [id number] [address]), [place] - [address] [place] ([place]) [place] [address] Phone: [phone] Fax: [phone]     Social Determinants of Health (SDOH) Social History: SDOH Screenings   Food Insecurity: No Food Insecurity (08/11/2022)  Housing: Low Risk  (08/11/2022)  Transportation Needs: No Transportation Needs (08/11/2022)  Utilities: Not At Risk (08/11/2022)  Tobacco Use: High Risk (08/11/2022)   SDOH Interventions:     Readmission Risk Interventions     No data to display

## 2022-08-12 NOTE — Consult Note (Addendum)
NAME:  Nathan Palmer., MRN:  [id number], DOB:  Oct 18, 1950, LOS: 0 ADMISSION DATE:  08/11/2022, CONSULTATION DATE:  4/29 REFERRING MD:  Dr. Jarvis Newcomer, CHIEF COMPLAINT:  Congestion    History of Present Illness:  72 y/o M who presented to an Urgent Care on 4/28 with reports of nasal / head congestion, cough with brown sputum production.    He was seen in the UC with sats of 94% on RA, placed on 2L with sats of 100%.  He had normal BP initially, mildly tachycardic.  He developed worsening of SBP into the 80's and was given IVF's. He continues to smoke, though he stopped 3 days prior to admission.  He reported weakness. The patient was documented to have diffuse wheezing.  Initial CBC notable for WBC 17.2, absolute eosinophils 10.5.  He did not respond to nebulized bronchodilators and steroids.  His lactate was negative. The patient was admitted per [facility] for further evaluation.   PCCM consulted 4/29 for pulmonary evaluation.    The patient reports feeling poorly for ~5 days prior to admit. He notes approximate 2lb weight loss - states he has not had much appetite with this illness.  Initially thought he had allergies and went to the store and took pseudophed, Afrin for symptoms but "I over did it and passed out".  He woke on the floor and was disoriented, looking for his cigarette. Denies night sweats, fevers, chills.  At baseline, he lives alone. He has a wood burning fireplace.  Stopped using it approximately 2 weeks ago. Occasionally the house will fill up with smoke.  He uses a humidifier.  He typically gets up in the am and smokes a 1/2 a joint and has one beer.  He never drinks to "be drunk".  He continues to split wood in the winter for work out and has a garden in the summer.  Denies chest pain.  Continues to smoke, has smoked 50 years, 1ppd at his heaviest.  Remote crack use but none in many years. 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  Retired now.  Reports occasional itchy  rashes that he has a prescribed cream for from the [place].   Pertinent  Medical History  Arthritis  Glaucoma  HTN  HLD  PVD  ETOH Abuse   Significant Hospital Events: Including procedures, antibiotic start and stop dates in addition to other pertinent events   4/28 Admit  4/29 PCCM consulted   Interim History / Subjective:  Pt reports feeling significantly improved  Afebrile  Peripheral absolute EOS 0.6, WBC down to 3.8 (from 17.2)  Objective   Blood pressure 103/66, pulse 99, temperature 98.4 F (36.9 C), temperature source Oral, resp. rate 18, height 5\' 7"  (1.702 m), weight 59 kg, SpO2 98 %.        Intake/Output Summary (Last 24 hours) at 08/12/2022 0955 Last data filed at 08/12/2022 4098 Gross per 24 hour  Intake 1340 ml  Output 1050 ml  Net 290 ml   Filed Weights   08/11/22 1716  Weight: 59 kg    Examination: General: pleasant, cachectic elderly adult male sitting up in bed in NAD HENT: MM pink/moist, edentulous, pupils =/reactive, anicteric  Lungs: non-labored at rest, lungs bilaterally with wheezing  Cardiovascular: s1s2 RRR, no m/r/g Abdomen: flat, soft, bsx4 active Extremities: warm/dry, no edema, muscle wasting  Neuro: AAOx4, speech clear, MAE     RVP 4/29 > negative  BCx2 4/28 >   Resolved Hospital Problem list  Assessment & Plan:   Tobacco Abuse with Acute Bronchitis  Clear CXR on admission.  No PFT's to define lung disease, hyperinflation on CXR.  RVP negative.  -smoking cessation counseling  -consider outpatient lung cancer screening, full PFT's -continue dulera, incruse  -PRN albuterol  -prednisone 40 mg QD  -PPI -continue abx  -add claritin, flonase   Peripheral Eosinophilia  Consider infectious, inflammatory, allergic, rheumatalgic, neoplastic & exposure etiologies. 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  Adrenal insuffiencey.  -send peripheral smear, HIV, ESR, ANCA, IgG   -assess HRCT Chest -pending above lab work up, consider aspergillus IgG -will need outpatient follow up with pulmonary > is followed at the [place] in [place]. Unsure if he sees Pulm.   ETOH Abuse  -no hx of withdrawal symptoms, drinks one beer per day -CIWA per [facility], though doubt he will need it  Best Practice (right click and "Reselect all SmartList Selections" daily)  Per [facility]   Labs   CBC: Recent Labs  Lab 08/11/22 1747 08/12/22 0541  WBC 17.2* 3.8*  NEUTROABS 3.3 2.3  HGB 11.6* 11.2*  HCT 34.4* 34.6*  MCV 95.0 95.1  PLT 215 214    Basic Metabolic Panel: Recent Labs  Lab 08/11/22 1747 08/11/22 1918 08/12/22 0541  NA 137  --  138  K 4.0 3.5 4.5  CL 101  --  101  CO2 26  --  29  GLUCOSE 90  --  151*  BUN 8  --  7*  CREATININE 1.03  --  1.03  CALCIUM 7.5*  --  8.4*   GFR: Estimated Creatinine Clearance: 54.1 mL/min (by C-G formula based on SCr of 1.03 mg/dL). Recent Labs  Lab 08/11/22 1747 08/11/22 1918 08/12/22 0541  WBC 17.2*  --  3.8*  LATICACIDVEN 1.4 1.5  --     Liver Function Tests: Recent Labs  Lab 08/11/22 1747  AST 25  ALT 16  ALKPHOS 141*  BILITOT 0.7  PROT 6.7  ALBUMIN 2.9*   No results for input(s): "LIPASE", "AMYLASE" in the last 168 hours. No results for input(s): "AMMONIA" in the last 168 hours.  ABG No results found for: "PHART", "PCO2ART", "PO2ART", "HCO3", "TCO2", "ACIDBASEDEF", "O2SAT"   Coagulation Profile: Recent Labs  Lab 08/11/22 1747  INR 1.1    Cardiac Enzymes: No results for input(s): "CKTOTAL", "CKMB", "CKMBINDEX", "TROPONINI" in the last 168 hours.  HbA1C: No results found for: "HGBA1C"  CBG: No results for input(s): "GLUCAP" in the last 168 hours.  Review of Systems: Positives in [place]   Gen: Denies fever, chills, weight change, fatigue, night sweats HEENT: Denies blurred vision, double vision, hearing loss, tinnitus, sinus congestion, rhinorrhea, sore throat, neck stiffness, dysphagia PULM: Denies  shortness of breath, cough, sputum production, hemoptysis, wheezing CV: Denies chest pain, edema, orthopnea, paroxysmal nocturnal dyspnea, palpitations GI: Denies abdominal pain, nausea, vomiting, diarrhea, hematochezia, melena, constipation, change in bowel habits GU: Denies dysuria, hematuria, polyuria, oliguria, urethral discharge Endocrine: Denies hot or cold intolerance, polyuria, polyphagia or appetite change Derm: Denies rash, dry skin, scaling or peeling skin change Heme: Denies easy bruising, bleeding, bleeding gums Neuro: Denies headache, numbness, weakness, slurred speech, loss of memory or consciousness  Past Medical History:  He,  has a past medical history of Arthritis, Glaucoma, Hyperlipidemia, Hypertension, and Peripheral vascular disease (HCC).   Surgical History:   Past Surgical History:  Procedure Laterality Date   ABDOMINAL AORTAGRAM Left 12/04/2017   ABDOMINAL AORTOGRAM W/LOWER EXTREMITY (N/A) as a surgical  intervention    ABDOMINAL AORTOGRAM W/LOWER EXTREMITY N/A 12/04/2017   Procedure: ABDOMINAL AORTOGRAM W/LOWER EXTREMITY;  Surgeon: Runell Gess, MD;  Location: [facility] IN[facility]IVE [facility];  Service: Cardiovascular;  Laterality: N/A;   COLONOSCOPY W/ POLYPECTOMY     ENDARTERECTOMY FEMORAL Right 01/07/2018   Procedure: ENDARTERECTOMY FEMORAL RIGHT;  Surgeon: Nada Libman, MD;  [facility];  Service: Vascular;  Laterality: Right;   FEMORAL ENDARTERECTOMY Right 01/07/2018   FOOT SURGERY Bilateral    bone spurs   ILIAC ATHERECTOMY Right 01/07/2018   Procedure: EXTERNAL ILIAC ATHERECTOMY;  Surgeon: Nada Libman, MD;  Location: [facility] OR;  Service: Vascular;  Laterality: Right;   INSERTION OF ILIAC STENT Right 01/07/2018   Procedure: INSERTION OF STENT COMMON AND EXTERNAL ILIAC;  Surgeon: Nada Libman, MD;  Location: [facility] OR;  Service: Vascular;  Laterality: Right;   PERIPHERAL VASCULAR INTERVENTION Left 12/04/2017   Procedure: PERIPHERAL VASCULAR INTERVENTION;   Surgeon: Runell Gess, MD;  Location: [facility];  Service: Cardiovascular;  Laterality: Left;  left external and common     Social History:   reports that he has been smoking cigarettes. He has a 10.00 pack-year smoking history. He has never used smokeless tobacco. He reports current alcohol use. He reports current drug use. Drug: Marijuana.   Family History:  His family history is not on file.   Allergies No Known Allergies   Home Medications  Prior to Admission medications   Medication Sig Start Date End Date Taking? Authorizing Provider  acetaminophen (TYLENOL) 500 MG tablet Take 500-1,000 mg by mouth 3 (three) times daily as needed for mild pain, headache or moderate pain.   Yes [provider]  allopurinol (ZYLOPRIM) 300 MG tablet Take 300 mg by mouth daily.   Yes [provider]  amLODipine (NORVASC) 5 MG tablet Take 5 mg by mouth daily.   Yes [provider]  aspirin EC 81 MG tablet Take 81 mg by mouth daily.   Yes [provider]  atorvastatin (LIPITOR) 80 MG tablet Take 1 tablet (80 mg total) by mouth daily at 6 PM. 12/05/17  Yes Bhagat, Bhavinkumar, PA  buPROPion (ZYBAN) 150 MG 12 hr tablet Take 150 mg by mouth every other day.   Yes [provider]  clopidogrel (PLAVIX) 75 MG tablet Take 1 tablet (75 mg total) by mouth daily with breakfast. 12/05/17  Yes Bhagat, Bhavinkumar, PA  diclofenac Sodium (VOLTAREN) 1 % GEL Apply 2 g topically 4 (four) times daily as needed (pain).   Yes [provider]  finasteride (PROSCAR) 5 MG tablet Take 5 mg by mouth daily.   Yes [provider]  latanoprost (XALATAN) 0.005 % ophthalmic solution Place 1 drop into both eyes at bedtime.   Yes [provider]  omeprazole (PRILOSEC OTC) 20 MG tablet Take 1 tablet (20 mg total) by mouth daily. 02/04/22 08/11/22 Yes Dahal, Melina Schools, MD  tamsulosin (FLOMAX) 0.4 MG CAPS capsule Take 0.8 mg by mouth daily.   Yes [provider]     Critical care time: n/a    Canary Brim, MSN, APRN, NP-C, AGACNP-BC [facility] 08/12/2022, 9:55 AM   Please see [email or web address] for pager details.   From 7A-7P if no response, please call [phone] After hours, please call ELink [phone]

## 2022-08-12 NOTE — Progress Notes (Signed)
[facility] PROGRESS NOTE  Nathan BOGNAR Sr. (DOB: 13-Sep-1950) ZOX:[id number] PCP: Sherwood Gambler, MD  Brief Narrative: Nathan SEDER Sr. is a 72 y.o. male with a history of PAD s/p right femoral endarterectomy 2019, tobacco use, alcohol use, HTN who presented to the ED on 08/11/2022 by way of urgent care where he complained of cough, nasal congestion and shortness of breath, found to be hypoxic and hypotensive. Supplemental oxygen and IV fluids were given, BP normalized, though he was still on 2L O2. Work up included no infiltrate on CXR, eosinophilia, though he continued to have dyspnea and wheezing and was admitted last night.  Subjective: Breathing much better, still with persistent cough occasionally productive of sputum. No chest pain. No fevers. Had headache that's improved but remains. Nasal congestion has dried up.  Objective: BP 103/66 (BP Location: Left Arm)   Pulse 99   Temp 98.4 F (36.9 C) (Oral)   Resp 18   Ht 5\' 7"  (1.702 m)   Wt 59 kg   SpO2 96%   BMI 20.36 kg/m   Gen: No distress older male Pulm: Coarse with expiratory wheezing, nonlabored at rest on 2L   CV: RRR, no MRG, no pitting edema, diminished DP pulses GI: Soft, NT, ND, +BS Neuro: Alert and oriented. No new focal deficits. Ext: Warm, no deformities. 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 RVP negative. - Continue prednisone, scheduled inhaled bronchodilators and prn albuterol - Change to azithromycin. Follow up sputum culture  Eosinophilia: ?Related to tobacco use. No significant atopic history, though differential is broad, including GPA.  - Peripheral smear pending - Chest CT ordered per pulmonary, appreciate their assistance.  Alcohol use: Reports one beer daily to me, no evidence of withdrawal at this time.  - Continue CIWA monitoring  Tobacco use: Long term.  -  Cessation counseling provided. Pt engaged in discussion.  - Continue bupropion 150mg   PAD: No critical ischemia currently.  - Continue DAPT and high-intensity statin (last LDL was 18)  Glaucoma:  - Continue gtt's  GERD:  - Cotninue PPI  BPH:  - Continue tamsulosin, finasteride  HTN:  - Normotensive while holding norvasc.  Liver lesion: See evidence of this on CT Oct 2023.  - Defer further work up to PCP.  Tyrone Nine, MD [facility] [email or web address] 08/12/2022, 10:56 AM

## 2022-08-13 ENCOUNTER — Encounter (HOSPITAL_COMMUNITY): Payer: Self-pay | Admitting: Family Medicine

## 2022-08-13 DIAGNOSIS — J209 Acute bronchitis, unspecified: Secondary | ICD-10-CM | POA: Diagnosis not present

## 2022-08-13 LAB — HIV-1 RNA QUANT-NO REFLEX-BLD
HIV 1 RNA Quant: <20 copies/mL
LOG10 HIV-1 RNA: UNDETERMINED log10copy/mL

## 2022-08-13 LAB — CBC WITH DIFFERENTIAL/PLATELET
Abs Immature Granulocytes: 0.04 10*3/uL (ref 0.00–0.07)
Basophils Absolute: 0.1 10*3/uL (ref 0.0–0.1)
Basophils Relative: 0 %
Eosinophils Absolute: 2.8 10*3/uL — ABNORMAL HIGH (ref 0.0–0.5)
Eosinophils Relative: 23 %
HCT: 34.4 % — ABNORMAL LOW (ref 39.0–52.0)
Hemoglobin: 11.3 g/dL — ABNORMAL LOW (ref 13.0–17.0)
Immature Granulocytes: 0 %
Lymphocytes Relative: 11 %
Lymphs Abs: 1.4 10*3/uL (ref 0.7–4.0)
MCH: 31.2 pg (ref 26.0–34.0)
MCHC: 32.8 g/dL (ref 30.0–36.0)
MCV: 95 fL (ref 80.0–100.0)
Monocytes Absolute: 1.2 10*3/uL — ABNORMAL HIGH (ref 0.1–1.0)
Monocytes Relative: 10 %
Neutro Abs: 6.9 10*3/uL (ref 1.7–7.7)
Neutrophils Relative %: 56 %
Platelets: 222 10*3/uL (ref 150–400)
RBC: 3.62 MIL/uL — ABNORMAL LOW (ref 4.22–5.81)
RDW: 14.7 % (ref 11.5–15.5)
WBC: 12.4 10*3/uL — ABNORMAL HIGH (ref 4.0–10.5)
nRBC: 0 % (ref 0.0–0.2)

## 2022-08-13 LAB — ANCA TITERS
Atypical P-ANCA titer: <1:20 {titer}
C-ANCA: <1:20 {titer}
P-ANCA: <1:20 {titer}

## 2022-08-13 LAB — IGG: IgG (Immunoglobin G), Serum: 1765 mg/dL — ABNORMAL HIGH (ref 603–1613)

## 2022-08-13 MED ORDER — SULFAMETHOXAZOLE-TRIMETHOPRIM 800-160 MG PO TABS
1.0000 | ORAL_TABLET | ORAL | Status: DC
  Administered 2022-08-13: 1 via ORAL
  Filled 2022-08-13 (×2): qty 1

## 2022-08-13 MED ORDER — ALBUTEROL SULFATE (2.5 MG/3ML) 0.083% IN NEBU
2.5000 mg | INHALATION_SOLUTION | Freq: Two times a day (BID) | RESPIRATORY_TRACT | Status: DC
  Administered 2022-08-14 – 2022-08-15 (×3): 2.5 mg via RESPIRATORY_TRACT
  Filled 2022-08-13 (×3): qty 3

## 2022-08-13 NOTE — Progress Notes (Signed)
[facility] PROGRESS NOTE  Nathan DAVIDS Sr. (DOB: [birth year]) ZOX:[id number] PCP: Sherwood Gambler, MD  Brief Narrative: Nathan MOCHIZUKI Sr. is a 72 y.o. male with a history of PAD s/p right femoral endarterectomy 2019, tobacco use, alcohol use, HTN who presented to the ED on 08/11/2022 by way of urgent care where he complained of cough, nasal congestion and shortness of breath, found to be hypoxic and hypotensive. Supplemental oxygen and IV fluids were given, BP normalized, though he was still on 2L O2. Work up included no infiltrate on CXR, eosinophilia, though he continued to have dyspnea and wheezing and was admitted. Pulmonary consulted. 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   Subjective: Still coughing gunk up and short of breath at rest. Slightly improved but still wheezing. Reports he hasn't been diagnosed with lung cancer or received treatments for lung cancer, but [facility].   Objective: BP 122/81 (BP Location: Right Arm)   Pulse 97   Temp 98.9 F (37.2 C)   Resp 19   Ht 5\' 7"  (1.702 m)   Wt 59 kg   SpO2 93%   BMI 20.36 kg/m   Gen: Frail male in no acute distress Pulm: Diffuse rhonchi and wheezing, tachypneic at rest without increased WOB.   CV: RRR, no MRG or pitting edema GI: Soft, NT, ND, +BS  Neuro: Alert and oriented. No new focal deficits. Ext: Warm, no deformities. 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 RVP negative. - Continue prednisone, scheduled inhaled bronchodilators and prn albuterol. ?if needs augmented steroid dosing.  - Continue azithromycin. Follow up sputum culture (sent). Blood cultures NGTD.  - Appreciate pulmonary  recommendations.  Eosinophilia: ?Related to tobacco use. No significant atopic history, though differential is broad, including GPA. Peripheral smear reviewed as "polychromasia."  - ANCA titer, IgG, HIV in process. ESR 56, CRP 0.8.   Alcohol use: Reports one beer daily to me, no evidence of withdrawal at this time.  - Continue CIWA monitoring, can DC if no withdrawal symptoms developing over next 24 hours.  Tobacco use: Long term.  - Cessation counseling provided. Pt engaged in discussion.  - Continue bupropion 150mg   PAD: No critical ischemia currently.  - Continue DAPT and high-intensity statin (last LDL was 18)  Glaucoma:  - Continue gtt's  GERD:  - Cotninue PPI  BPH:  - Continue tamsulosin, finasteride  HTN:  - Normotensive while holding norvasc.  Liver lesion: See evidence of this on CT Oct 2023.  - Defer further work up to PCP.  Tyrone Nine, MD [facility] [email or web address] 08/13/2022, 10:38 AM

## 2022-08-13 NOTE — Consult Note (Signed)
NAME:  Nathan Palmer., MRN:  [id number], DOB:  October 24, 1950, LOS: 1 ADMISSION DATE:  08/11/2022, CONSULTATION DATE:  4/29 REFERRING MD:  Dr. Jarvis Newcomer, CHIEF COMPLAINT:  Congestion    History of Present Illness:  72 y/o M who presented to an Urgent Care on 4/28 with reports of nasal / head congestion, cough with brown sputum production.    He was seen in the UC with sats of 94% on RA, placed on 2L with sats of 100%.  He had normal BP initially, mildly tachycardic.  He developed worsening of SBP into the 80's and was given IVF's. He continues to smoke, though he stopped 3 days prior to admission.  He reported weakness. The patient was documented to have diffuse wheezing.  Initial CBC notable for WBC 17.2, absolute eosinophils 10.5.  He did not respond to nebulized bronchodilators and steroids.  His lactate was negative. The patient was admitted per [facility] for further evaluation.   PCCM consulted 4/29 for pulmonary evaluation.    The patient reports feeling poorly for ~5 days prior to admit. He notes approximate 2lb weight loss - states he has not had much appetite with this illness.  Initially thought he had allergies and went to the store and took pseudophed, Afrin for symptoms but "I over did it and passed out".  He woke on the floor and was disoriented, looking for his cigarette. Denies night sweats, fevers, chills.  At baseline, he lives alone. He has a wood burning fireplace.  Stopped using it approximately 2 weeks ago. Occasionally the house will fill up with smoke.  He uses a humidifier.  He typically gets up in the am and smokes a 1/2 a joint and has one beer.  He never drinks to "be drunk".  He continues to split wood in the winter for work out and has a garden in the summer.  Denies chest pain.  Continues to smoke, has smoked 50 years, 1ppd at his heaviest.  Remote crack use but none in many years. 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  Retired now.  Reports occasional itchy  rashes that he has a prescribed cream for from the [place].   Per 12/21/21 Note at [place] 72 yo male with 46 pack year smoking history. He has had LUL cavitary lesion s/p bronch 06/2018 negative for malignancy (?aspergilloma given presence of fungal organisms). Had repeat Bronch 06/12/21 at [facility] and negative for malignancy. Per [facility] tumor board, rec f/u in 6 months.   1. Lung cancer screening/LDCT. I reviewed in detail with [name] the results from his recent LDCT on 12/10/2021. The LUL cavitary lesion is overall stable in size with improved aeration of the internal cystic space. He has known undergone bronch x 2 (2020 and most recently 05/2021 at [facility]) and was negative both times. Therefore, given stable imaging findings and negative workup/bronch, will return to annual LDCT for lung cancer screening. He voiced understanding.  2. Smoking cessation. He has cut back to 2 packs/week. We discussed resources here at the [facility] including nicotine replacement, pharmacologic, smoking cessation classes, acupuncture and hypnosis. He declines referral for any of these resources at this time. He is currently using bupropion and nicotine lozenges prescribed by his PCP. We also discussed behavioral/lifestyle changes to aid in smoking cessation.   Follow-up: 12 months with LDCT   Pertinent  Medical History  Arthritis  Glaucoma  HTN  HLD  PVD  ETOH Abuse   Significant Hospital  Events: Including procedures, antibiotic start and stop dates in addition to other pertinent events   4/28 Admit  4/29 PCCM consulted   Interim History / Subjective:  Pt reports feeling better overall  Tmax 99  Daughter at bedside  Objective   Blood pressure 110/79, pulse 97, temperature 99 F (37.2 C), temperature source Oral, resp. rate 18, height 5\' 7"  (1.702 m), weight 59 kg, SpO2 98 %.        Intake/Output Summary (Last 24 hours) at 08/13/2022 1625 Last data filed at 08/13/2022 1350 Gross per 24 hour  Intake 1080  ml  Output 1900 ml  Net -820 ml   Filed Weights   08/11/22 1716  Weight: 59 kg    Examination: General: pleasant, cachectic elderly adult male sitting up in bed in NAD HENT: MM pink/moist, edentulous, pupils =/reactive, anicteric  Lungs: non-labored at rest, lungs bilaterally with wheezing  Cardiovascular: s1s2 RRR, no m/r/g Abdomen: flat, soft, bsx4 active Extremities: warm/dry, no edema, muscle wasting  Neuro: AAOx4, speech clear, MAE     RVP 4/29 > negative  BCx2 4/28 >  BCx2 4/28 >   HRCT Chest 4/29 > LUL known cavitary nodule with fiducial markers, bilateral bronchial wall thickening and mucus plugging   Resolved Hospital Problem list      Assessment & Plan:   Tobacco Abuse with Acute Bronchitis  Clear CXR on admission.  No PFT's to define lung disease, hyperinflation on CXR.  RVP negative.  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 Area non-malignant.  He has had significant improvement on steroids.  -ANCA Negative   -ESR & IgG elevated  -HRCT seems largely unchanged from prior studies at [facility] (in terms of read only) -peripheral smear with polychromasia  -treat as ABPA > 0.5mg /kg of prednisone for 14 days, then QOD dosing and further reduction over 3 month period.  Patient would not need antifungal treatment unless he can not taper off steroids.  -will need outpatient follow up with Pulmonary at the [facility]   ETOH Abuse  Suspected Chronic Pancreatitis (on CT imaging) No hx of withdrawal symptoms, drinks one beer per day -per [facility]   Best Practice (right click and "Reselect all SmartList Selections"  daily)  Per [facility]   Critical care time: n/a    Canary Brim, MSN, APRN, NP-C, AGACNP-BC [facility] 08/13/2022, 4:25 PM   Please see [email or web address] for pager details.   From 7A-7P if no response, please call [phone] After hours, please call ELink [phone]

## 2022-08-13 NOTE — ED Provider Notes (Signed)
[facility]    CSN: [id number] Arrival date & time: 08/11/22  1527      History   Chief Complaint Chief Complaint  Patient presents with   Nasal Congestion    HPI Nathan OLIVAR Sr. is a 72 y.o. male comes to urgent care with a 3-day history of nasal congestion, shortness of breath and cough.  Patient says symptoms started 3 days ago and has been been worsening.  Cough is productive of sputum which is discolored.  He endorses wheezing.  He endorses dizziness, and feeling faint when he stands up from the sitting position.  He denies any fever or chills.  No sick contacts.  No diarrhea.  No vomiting.  Patient has over 20-pack-year history of smoking but has not been formally diagnosed with COPD.  In the urgent care patient had a blood pressure of 87/61, heart rate of 114 and pulse oximetry of 87% on room air.  Patient was afebrile.   HPI  Past Medical History:  Diagnosis Date   Arthritis    Glaucoma    Hyperlipidemia    Hypertension    Peripheral vascular disease [facility])     Patient Active Problem List   Diagnosis Date Noted   Hypoxia 08/12/2022   ETOH abuse 08/11/2022   Acute bronchitis 08/11/2022   Eosinophilia 08/11/2022   Acute respiratory failure with hypoxia (HCC) 08/11/2022   Acute on chronic pancreatitis (HCC) 02/02/2022   Liver lesion 02/02/2022   PAD (peripheral artery disease) (HCC) 01/07/2018   Claudication in peripheral vascular disease (HCC) 11/14/2017   Essential hypertension 11/14/2017   Hyperlipidemia 11/14/2017   Tobacco abuse 11/14/2017    Past Surgical History:  Procedure Laterality Date   ABDOMINAL AORTAGRAM Left 12/04/2017   ABDOMINAL AORTOGRAM W/LOWER EXTREMITY (N/A) as a surgical intervention    ABDOMINAL AORTOGRAM W/LOWER EXTREMITY N/A 12/04/2017   Procedure: ABDOMINAL AORTOGRAM W/LOWER EXTREMITY;  Surgeon: Runell Gess, MD;  Location: [facility];  Service: Cardiovascular;  Laterality: N/A;   COLONOSCOPY W/  POLYPECTOMY     ENDARTERECTOMY FEMORAL Right 01/07/2018   Procedure: ENDARTERECTOMY FEMORAL RIGHT;  Surgeon: Nada Libman, MD;  Location: [facility] OR;  Service: Vascular;  Laterality: Right;   FEMORAL ENDARTERECTOMY Right 01/07/2018   FOOT SURGERY Bilateral    bone spurs   ILIAC ATHERECTOMY Right 01/07/2018   Procedure: EXTERNAL ILIAC ATHERECTOMY;  Surgeon: Nada Libman, MD;  Location: [facility] OR;  Service: Vascular;  Laterality: Right;   INSERTION OF ILIAC STENT Right 01/07/2018   Procedure: INSERTION OF STENT COMMON AND EXTERNAL ILIAC;  Surgeon: Nada Libman, MD;  Location: [facility] OR;  Service: Vascular;  Laterality: Right;   PERIPHERAL VASCULAR INTERVENTION Left 12/04/2017   Procedure: PERIPHERAL VASCULAR INTERVENTION;  Surgeon: Runell Gess, MD;  Location: [facility];  Service: Cardiovascular;  Laterality: Left;  left external and common       Home Medications    Prior to Admission medications   Medication Sig Start Date End Date Taking? Authorizing Provider  acetaminophen (TYLENOL) 500 MG tablet Take 500-1,000 mg by mouth 3 (three) times daily as needed for mild pain, headache or moderate pain.    [provider]  allopurinol (ZYLOPRIM) 300 MG tablet Take 300 mg by mouth daily.    [provider]  amLODipine (NORVASC) 5 MG tablet Take 5 mg by mouth daily.    [provider]  aspirin EC 81 MG tablet Take 81 mg by mouth daily.  [provider]  atorvastatin (LIPITOR) 80 MG tablet Take 1 tablet (80 mg total) by mouth daily at 6 PM. 12/05/17   Bhagat, Bhavinkumar, PA  buPROPion (ZYBAN) 150 MG 12 hr tablet Take 150 mg by mouth every other day.    [provider]  clopidogrel (PLAVIX) 75 MG tablet Take 1 tablet (75 mg total) by mouth daily with breakfast. 12/05/17   Bhagat, Bhavinkumar, PA  diclofenac Sodium (VOLTAREN) 1 % GEL Apply 2 g topically 4 (four) times daily as needed (pain).    [provider]  finasteride (PROSCAR) 5  MG tablet Take 5 mg by mouth daily.    [provider]  latanoprost (XALATAN) 0.005 % ophthalmic solution Place 1 drop into both eyes at bedtime.    [provider]  omeprazole (PRILOSEC OTC) 20 MG tablet Take 1 tablet (20 mg total) by mouth daily. 02/04/22 08/11/22  Lorin Glass, MD  tamsulosin (FLOMAX) 0.4 MG CAPS capsule Take 0.8 mg by mouth daily.    [provider]    Family History History reviewed. No pertinent family history.  Social History Social History   Tobacco Use   Smoking status: Every Day    Packs/day: 0.25    Years: 40.00    Additional pack years: 0.00    Total pack years: 10.00    Types: Cigarettes    Last attempt to quit: 12/04/2017    Years since quitting: 4.6   Smokeless tobacco: Never  Vaping Use   Vaping Use: Never used  Substance Use Topics   Alcohol use: Yes    Comment:  40 ounce a week   Drug use: Yes    Types: Marijuana    Comment: 01/06/18 - last time was 1 week ago     Allergies   Patient has no known allergies.   Review of Systems Review of Systems  Constitutional:  Positive for activity change and fatigue. Negative for chills and fever.  HENT:  Positive for congestion. Negative for sinus pressure, sinus pain and sore throat.   Eyes: Negative.   Respiratory:  Positive for cough, chest tightness, shortness of breath and wheezing.   Cardiovascular: Negative.   Gastrointestinal: Negative.   Genitourinary: Negative.   Musculoskeletal: Negative.   Skin: Negative.   Neurological:  Positive for dizziness, weakness and light-headedness.  Psychiatric/Behavioral: Negative.       Physical Exam Triage Vital Signs ED Triage Vitals  Enc Vitals Group     BP 08/11/22 1546 (!) 87/61     Pulse Rate 08/11/22 1546 (!) 114     Resp 08/11/22 1546 18     Temp 08/11/22 1546 97.9 F (36.6 C)     Temp src --      SpO2 08/11/22 1546 (!) 87 %     Weight --      Height --      Head Circumference --      Peak Flow --       Pain Score 08/11/22 1544 6     Pain Loc --      Pain Edu? --      Excl. in GC? --    No data found.  Updated Vital Signs BP 109/68   Pulse (!) 114   Temp 97.9 F (36.6 C)   Resp 18   SpO2 93%   Visual Acuity Right Eye Distance:   Left Eye Distance:   Bilateral Distance:    Right Eye Near:   Left Eye Near:  Bilateral Near:     Physical Exam Vitals and nursing note reviewed.  Constitutional:      General: He is in acute distress.     Appearance: He is ill-appearing.  HENT:     Right Ear: Tympanic membrane normal.     Left Ear: Tympanic membrane normal.  Eyes:     Pupils: Pupils are equal, round, and reactive to light.  Cardiovascular:     Rate and Rhythm: Regular rhythm. Tachycardia present.     Pulses: Normal pulses.     Heart sounds: Normal heart sounds.  Pulmonary:     Effort: Respiratory distress present.     Breath sounds: Wheezing, rhonchi and rales present.  Skin:    Capillary Refill: Capillary refill takes less than 2 seconds.  Neurological:     General: No focal deficit present.     Mental Status: He is alert and oriented to person, place, and time.      UC Treatments / Results  Labs (all labs ordered are listed, but only abnormal results are displayed) Labs Reviewed - No data to display  EKG   Radiology   Procedures Procedures (including critical care time)  Medications Ordered in UC Medications  sodium chloride 0.9 % bolus 1,000 mL (1,000 mLs Intravenous New Bag/Given 08/11/22 1608)    Initial Impression / Assessment and Plan / UC Course  I have reviewed the triage vital signs and the nursing notes.  Pertinent labs & imaging results that were available during my care of the patient were reviewed by me and considered in my medical decision making (see chart for details).     1.  Acute hypoxemic respiratory failure: 2 L of oxygen started EMS has been called Patient will need to be transported to the emergency department for  further management.  Patient will require hospitalization.  2.  Hypotension: 1 L fluid bolus for over 30 minutes.  Fluids was still running when EMS came for the patient Patient is going to need hospitalization for further management Patient agrees to go to the emergency department via EMS transport IV fluids run over Final Clinical Impressions(s) / UC Diagnoses   Final diagnoses:  Hypovolemic shock (HCC)  Acute hypoxemic respiratory failure [facility])     Discharge Instructions      Need to go to the emergency department for further evaluation and workup.  You have an infection that is causing your blood pressure and you will oxygen to be low.    ED Prescriptions   None    PDMP not reviewed this encounter.   Merrilee Jansky, MD 08/13/22 [id number]

## 2022-08-14 DIAGNOSIS — J209 Acute bronchitis, unspecified: Secondary | ICD-10-CM | POA: Diagnosis not present

## 2022-08-14 LAB — CBC WITH DIFFERENTIAL/PLATELET
Abs Immature Granulocytes: 0.04 10*3/uL (ref 0.00–0.07)
Basophils Absolute: 0 10*3/uL (ref 0.0–0.1)
Basophils Relative: 0 %
Eosinophils Absolute: 2.1 10*3/uL — ABNORMAL HIGH (ref 0.0–0.5)
Eosinophils Relative: 20 %
HCT: 35.4 % — ABNORMAL LOW (ref 39.0–52.0)
Hemoglobin: 11.5 g/dL — ABNORMAL LOW (ref 13.0–17.0)
Immature Granulocytes: 0 %
Lymphocytes Relative: 15 %
Lymphs Abs: 1.5 10*3/uL (ref 0.7–4.0)
MCH: 31.2 pg (ref 26.0–34.0)
MCHC: 32.5 g/dL (ref 30.0–36.0)
MCV: 95.9 fL (ref 80.0–100.0)
Monocytes Absolute: 1 10*3/uL (ref 0.1–1.0)
Monocytes Relative: 10 %
Neutro Abs: 5.9 10*3/uL (ref 1.7–7.7)
Neutrophils Relative %: 55 %
Platelets: 220 10*3/uL (ref 150–400)
RBC: 3.69 MIL/uL — ABNORMAL LOW (ref 4.22–5.81)
RDW: 14.9 % (ref 11.5–15.5)
WBC: 10.6 10*3/uL — ABNORMAL HIGH (ref 4.0–10.5)
nRBC: 0 % (ref 0.0–0.2)

## 2022-08-14 LAB — CULTURE, BLOOD (ROUTINE X 2)
Culture: NO GROWTH
Special Requests: ADEQUATE

## 2022-08-14 LAB — EXPECTORATED SPUTUM ASSESSMENT W GRAM STAIN, RFLX TO RESP C

## 2022-08-14 MED ORDER — ENSURE ENLIVE PO LIQD
237.0000 mL | Freq: Two times a day (BID) | ORAL | Status: DC
  Administered 2022-08-14 – 2022-08-15 (×2): 237 mL via ORAL

## 2022-08-14 NOTE — Plan of Care (Signed)
  Problem: Activity: Goal: Ability to tolerate increased activity will improve Outcome: Progressing Goal: Will verbalize the importance of balancing activity with adequate rest periods Outcome: Progressing   Problem: Respiratory: Goal: Ability to maintain a clear airway will improve Outcome: Progressing Goal: Levels of oxygenation will improve Outcome: Progressing Goal: Ability to maintain adequate ventilation will improve Outcome: Progressing   

## 2022-08-14 NOTE — Plan of Care (Signed)
  Problem: Activity: Goal: Ability to tolerate increased activity will improve Outcome: Progressing Goal: Will verbalize the importance of balancing activity with adequate rest periods Outcome: Progressing   Problem: Respiratory: Goal: Ability to maintain adequate ventilation will improve Outcome: Progressing   Problem: Clinical Measurements: Goal: Cardiovascular complication will be avoided Outcome: Progressing   Problem: Activity: Goal: Risk for activity intolerance will decrease Outcome: Progressing   Problem: Nutrition: Goal: Adequate nutrition will be maintained Outcome: Progressing

## 2022-08-14 NOTE — Progress Notes (Signed)
Initial Nutrition Assessment  DOCUMENTATION CODES:   Non-severe (moderate) malnutrition in context of chronic illness  INTERVENTION:   -Ensure Plus High Protein po BID, each supplement provides 350 kcal and 20 grams of protein.   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  GOAL:   Patient will meet greater than or equal to 90% of their needs  MONITOR:   PO intake, Supplement acceptance, Labs, Weight trends, I & O's  REASON FOR ASSESSMENT:   Malnutrition Screening Tool    ASSESSMENT:   72 y.o. male with a history of PAD s/p right femoral endarterectomy 2019, tobacco use, alcohol use, HTN who presented to the ED on 08/11/2022 by way of urgent care where he complained of cough, nasal congestion and shortness of breath, found to be hypoxic and hypotensive. Admitted for acute bronchitis.  Patient in room, very appreciative for the help he has received. Pt reports he was not eating well for a week d/t breathing issues. Pt did have his teeth pulled a few months back and he lost weight following that procedure as well. He is still adjusting to his new dentures, states his gums still get sore at times.  Pt agreeable to receiving Ensure supplements. Eating 100% of meals.   Pt reports his UBW ~125-130 lbs. Was unsure if he had been weighed or not. Ordered for new weight.   Medications: Folic acid, Multivitamin with minerals daily Thiamine  Labs reviewed.  NUTRITION - FOCUSED PHYSICAL EXAM:  Flowsheet Row Most Recent Value  Orbital Region Moderate depletion  Upper Arm Region Severe depletion  Thoracic and Lumbar Region Moderate depletion  Buccal Region Moderate depletion  Temple Region Mild depletion  Clavicle Bone Region Moderate  depletion  Clavicle and Acromion Bone Region Moderate depletion  Scapular Bone Region Moderate depletion  Dorsal Hand Mild depletion  Patellar Region Severe depletion  Anterior Thigh Region Severe depletion  Posterior Calf Region Severe depletion  Edema (RD Assessment) None  Hair Reviewed  Eyes Reviewed  Mouth Reviewed  Skin Reviewed  [dry]  Nails Reviewed       Diet Order:   Diet Order             Diet regular Room service appropriate? Yes; Fluid consistency: Thin  Diet effective now                   EDUCATION NEEDS:   No education needs have been identified at this time  Skin:  Skin Assessment: Reviewed RN Assessment  Last BM:  4/30  Height:   Ht Readings from Last 1 Encounters:  08/11/22 5\' 7"  (1.702 m)    Weight:   Wt Readings from Last 1 Encounters:  08/11/22 59 kg    BMI:  Body mass index is 20.36 kg/m.  Estimated Nutritional Needs:   Kcal:  1800-2000  Protein:  85-100g  Fluid:  2L/day  Tilda Franco, MS, RD, LDN Inpatient Clinical Dietitian Contact information available via Amion

## 2022-08-14 NOTE — Progress Notes (Signed)
[facility] PROGRESS NOTE  Nathan FIORELLO Sr. (DOB: [birth year]) JWJ:[id number] PCP: Sherwood Gambler, MD  Brief Narrative: Nathan BENNINGFIELD Sr. is a 72 y.o. male with a history of PAD s/p right femoral endarterectomy 2019, tobacco use, alcohol use, HTN who presented to the ED on 08/11/2022 by way of urgent care where he complained of cough, nasal congestion and shortness of breath, found to be hypoxic and hypotensive. Supplemental oxygen and IV fluids were given, BP normalized, though he was still on 2L O2. Work up included no infiltrate on CXR, eosinophilia, though he continued to have dyspnea and wheezing and was admitted. Pulmonary consulted. 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   Subjective: Breathing again some better, still short winded and having severe coughing spells with exertion/deep breaths. No other issues.   Objective: BP 121/81 (BP Location: Right Arm)   Pulse 92   Temp 98.3 F (36.8 C) (Oral)   Resp 18   Ht 5\' 7"  (1.702 m)   Wt 59 kg   SpO2 95%   BMI 20.36 kg/m   Gen: No distress, frail Pulm: Diminished with rhonchi expiratory but much improved in severity from prior.   CV: RRR GI: Soft, NT, ND, +BS  Neuro: Alert and oriented. No new focal deficits. Ext: Warm, no deformities. 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 RVP negative. - Continue prednisone at 0.5mg /kg daily x14 days, then QOD and plan to taper per pulmonary at [facility]. Start prophylactic bactrim TIW as well. 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 Defer antifungal at this time, but may need voriconazole if unable to taper steroids.  - Continue azithromycin for now.  Follow up sputum culture (sent). Blood cultures NGTD.  - Check ambulatory pulse oximetry. We're nearing discharge. - Appreciate pulmonary recommendations.  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 No significant atopic history, though differential is broad, including GPA. Peripheral smear reviewed as "polychromasia." ANCA negative, HIV NR, IgG level is 1,765 (ULN 1,613). ESR 56, CRP 0.8.   Alcohol use: Reports one beer daily to me, no evidence of withdrawal at this time.    Tobacco use: Long term.  - Cessation counseling provided for this and THC. Pt engaged in discussion.  - Continue bupropion 150mg   PAD: No critical ischemia currently.  - Continue DAPT and high-intensity statin (last LDL was 18)  Glaucoma:  - Continue gtt's  GERD:  - Cotninue PPI  BPH:  - Continue tamsulosin, finasteride  HTN:  - Normotensive while holding norvasc.  Liver lesion: See evidence of this on CT Oct 2023.  - Defer further work up to PCP.  Tyrone Nine, MD [facility] [email or web address] 08/14/2022, 11:16 AM

## 2022-08-14 NOTE — Evaluation (Addendum)
Physical Therapy Evaluation-1x Patient Details Name: Nathan Palmer Sr. MRN: [id number] DOB: 03-Nov-1950 Today's Date: 08/14/2022  History of Present Illness  72 yo male admitted with acute bronchitis, COPD exac, eosinophilia. Hx of ETOH abuse, PVD, claudication  Clinical Impression  On eval, pt was Mod Ind with mobility. He walked ~250 feet around the unit. O2 91% on RA, HR 125 bpm with ambulation. At rest, O2 96%. No acute PT needs. 1x eval. Will sign off.        Recommendations for follow up therapy are one component of a multi-disciplinary discharge planning process, led by the attending physician.  Recommendations may be updated based on patient status, additional functional criteria and insurance authorization.  Follow Up Recommendations       Assistance Recommended at Discharge None  Patient can return home with the following       Equipment Recommendations None recommended by PT  Recommendations for Other Services       Functional Status Assessment Patient has had a recent decline in their functional status and demonstrates the ability to make significant improvements in function in a reasonable and predictable amount of time.     Precautions / Restrictions Precautions Precaution Comments: monitor HR Restrictions Weight Bearing Restrictions: No      Mobility  Bed Mobility Overal bed mobility: Modified Independent                  Transfers Overall transfer level: Modified independent                      Ambulation/Gait Ambulation/Gait assistance: Modified independent (Device/Increase time) Gait Distance (Feet): 250 Feet Assistive device: None Gait Pattern/deviations: WFL(Within Functional Limits)       General Gait Details: HR 125 bpm, O2 91% on RA. Pt able to converse throughout ambulation without obvious dyspnea.  Stairs            Wheelchair Mobility    Modified Rankin (Stroke Patients Only)       Balance Overall balance  assessment: Modified Independent                                           Pertinent Vitals/Pain Pain Assessment Pain Assessment: No/denies pain    Home Living Family/patient expects to be discharged to:: Private residence Living Arrangements: Alone Available Help at Discharge: Family;Available PRN/intermittently Type of Home: House Home Access: Stairs to enter   [employer] of Steps: 1   Home Layout: One level Home Equipment: None      Prior Function Prior Level of Function : Independent/Modified Independent                     Hand Dominance        Extremity/Trunk Assessment   Upper Extremity Assessment Upper Extremity Assessment: Overall WFL for tasks assessed    Lower Extremity Assessment Lower Extremity Assessment: Overall WFL for tasks assessed    Cervical / Trunk Assessment Cervical / Trunk Assessment: Normal  Communication   Communication: No difficulties  Cognition Arousal/Alertness: Awake/alert Behavior During Therapy: WFL for tasks assessed/performed Overall Cognitive Status: Within Functional Limits for tasks assessed  General Comments      Exercises     Assessment/Plan    PT Assessment Patient does not need any further PT services  PT Problem List         PT Treatment Interventions      PT Goals (Current goals can be found in the Care Plan section)  Acute Rehab PT Goals Patient Stated Goal: to continue to get better and get home PT Goal Formulation: All assessment and education complete, DC therapy    Frequency       Co-evaluation               AM-PAC PT "6 Clicks" Mobility  Outcome Measure Help needed turning from your back to your side while in a flat bed without using bedrails?: None Help needed moving from lying on your back to sitting on the side of a flat bed without using bedrails?: None Help needed moving to and from a  bed to a chair (including a wheelchair)?: None Help needed standing up from a chair using your arms (e.g., wheelchair or bedside chair)?: None Help needed to walk in hospital room?: None Help needed climbing 3-5 steps with a railing? : None 6 Click Score: 24    End of Session   Activity Tolerance: Patient tolerated treatment well Patient left: in bed;with call bell/phone within reach Nursing Communication: made NT aware that [place] O2 was left off pt since O2 sats were >90% during session.        Time: 1610-9604 PT Time Calculation (min) (ACUTE ONLY): 10 min   Charges:   PT Evaluation $PT Eval Low Complexity: 1 Low             Faye Ramsay, PT Acute Rehabilitation  Office: [phone]

## 2022-08-15 DIAGNOSIS — B4481 Allergic bronchopulmonary aspergillosis: Secondary | ICD-10-CM

## 2022-08-15 DIAGNOSIS — J209 Acute bronchitis, unspecified: Secondary | ICD-10-CM | POA: Diagnosis not present

## 2022-08-15 DIAGNOSIS — E440 Moderate protein-calorie malnutrition: Secondary | ICD-10-CM | POA: Insufficient documentation

## 2022-08-15 LAB — CBC WITH DIFFERENTIAL/PLATELET
Abs Immature Granulocytes: 0.04 10*3/uL (ref 0.00–0.07)
Basophils Absolute: 0 10*3/uL (ref 0.0–0.1)
Basophils Relative: 0 %
Eosinophils Absolute: 1.1 10*3/uL — ABNORMAL HIGH (ref 0.0–0.5)
Eosinophils Relative: 13 %
HCT: 34.5 % — ABNORMAL LOW (ref 39.0–52.0)
Hemoglobin: 11.4 g/dL — ABNORMAL LOW (ref 13.0–17.0)
Immature Granulocytes: 1 %
Lymphocytes Relative: 16 %
Lymphs Abs: 1.4 10*3/uL (ref 0.7–4.0)
MCH: 31.3 pg (ref 26.0–34.0)
MCHC: 33 g/dL (ref 30.0–36.0)
MCV: 94.8 fL (ref 80.0–100.0)
Monocytes Absolute: 1 10*3/uL (ref 0.1–1.0)
Monocytes Relative: 12 %
Neutro Abs: 4.9 10*3/uL (ref 1.7–7.7)
Neutrophils Relative %: 58 %
Platelets: 237 10*3/uL (ref 150–400)
RBC: 3.64 MIL/uL — ABNORMAL LOW (ref 4.22–5.81)
RDW: 14.8 % (ref 11.5–15.5)
WBC: 8.5 10*3/uL (ref 4.0–10.5)
nRBC: 0 % (ref 0.0–0.2)

## 2022-08-15 LAB — CULTURE, BLOOD (ROUTINE X 2): Special Requests: ADEQUATE

## 2022-08-15 MED ORDER — SULFAMETHOXAZOLE-TRIMETHOPRIM 800-160 MG PO TABS: 1.0000 | ORAL_TABLET | ORAL | 0 refills | Status: AC

## 2022-08-15 MED ORDER — PREDNISONE 10 MG PO TABS: ORAL_TABLET | ORAL | 0 refills | Status: AC

## 2022-08-15 NOTE — Discharge Summary (Signed)
Physician Discharge Summary   Patient: Nathan WOLIN Sr. MRN: [id number] DOB: [birth year]  Admit date:     08/11/2022  Discharge date: 08/15/22  Discharge Physician: Tyrone Nine   PCP: Sherwood Gambler, MD   Recommendations at discharge:  Follow up with pulmonology and PCP at the [facility]. 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   Discharge Diagnoses: Principal Problem:   Acute bronchitis Active Problems:   Eosinophilia   Acute respiratory failure with hypoxia (HCC)   ETOH abuse   Essential hypertension   Hyperlipidemia   Tobacco abuse   Liver lesion   Hypoxia   Malnutrition of moderate degree  Hospital Course: Nathan ORAVEC Sr. is a 72 y.o. male with a history of PAD s/p right femoral endarterectomy 2019, tobacco use, alcohol use, HTN who presented to the ED on 08/11/2022 by way of urgent care where he complained of cough, nasal congestion and shortness of breath, found to be hypoxic and hypotensive. Supplemental oxygen and IV fluids were given, BP normalized, though he was still on 2L O2. Work up included no infiltrate on CXR, eosinophilia, though he continued to have dyspnea and wheezing and was admitted. Pulmonary consulted. 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 Respiratory status has improved considerably and the patient is stable for discharge. Please see pertinent details below.   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  - Start prophylactic bactrim TIW - Needs serial imaging to make sure the  aspergilloma is not evolving (imaging here stable). Defer antifungal at this time, but may need voriconazole if unable to taper steroids.  - Sputum cultures unacceptable x2. Blood cultures NGTD. Completed 5 days of abx with CTX > azithromycin. - No hypoxia on exertional testing.    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 No significant atopic history, though differential is broad, including GPA. Peripheral smear reviewed as "polychromasia." ANCA negative, HIV NR, IgG level is 1,765 (ULN 1,613). ESR 56, CRP 0.8.    Alcohol use: Reports one beer daily to me, no evidence of withdrawal at this time.     Tobacco use: Long term.  - Cessation counseling provided for this and THC. Pt engaged in discussion.  - Continue bupropion 150mg    PAD: No critical ischemia currently.  - Continue DAPT and high-intensity statin (last LDL was 18)   Glaucoma:  - Continue gtt's   GERD:  - Continue PPI   BPH:  - Continue tamsulosin, finasteride   HTN:  - Cotninue norvasc.   Liver lesion: See evidence of this on CT Oct 2023.  - Defer further work up to PCP.  Moderate protein calorie malnutrition:  - Supplement protein.   Consultants: Pulmonology Procedures performed: None  Disposition: Home Diet recommendation: Regular, supplement protein DISCHARGE MEDICATION: Allergies as of 08/15/2022   No Known Allergies      Medication List     TAKE these medications    acetaminophen 500 MG tablet Commonly known as: TYLENOL Take 500-1,000 mg by mouth 3 (three) times daily as needed for mild pain, headache or moderate pain.   allopurinol 300 MG  tablet Commonly known as: ZYLOPRIM Take 300 mg by mouth daily.   amLODipine 5 MG tablet Commonly known as: NORVASC Take 5 mg by mouth daily.   aspirin EC 81 MG tablet Take 81 mg by mouth daily.   atorvastatin 80 MG tablet Commonly known as: LIPITOR Take 1 tablet (80 mg total) by mouth daily at 6 PM.   buPROPion 150 MG 12 hr tablet Commonly  known as: ZYBAN Take 150 mg by mouth every other day.   clopidogrel 75 MG tablet Commonly known as: PLAVIX Take 1 tablet (75 mg total) by mouth daily with breakfast.   diclofenac Sodium 1 % Gel Commonly known as: VOLTAREN Apply 2 g topically 4 (four) times daily as needed (pain).   finasteride 5 MG tablet Commonly known as: PROSCAR Take 5 mg by mouth daily.   latanoprost 0.005 % ophthalmic solution Commonly known as: XALATAN Place 1 drop into both eyes at bedtime.   omeprazole 20 MG tablet Commonly known as: PriLOSEC OTC Take 1 tablet (20 mg total) by mouth daily.   predniSONE 10 MG tablet Commonly known as: DELTASONE Take 3 tablets (30 mg total) by mouth daily with breakfast for 14 days, THEN 3 tablets (30 mg total) every other day for 14 days. THEN refer to pulmonology recommendations. Start taking on: Aug 16, 2022   sulfamethoxazole-trimethoprim 800-160 MG tablet Commonly known as: BACTRIM DS Take 1 tablet by mouth 3 (three) times a week. Start taking on: Aug 16, 2022   tamsulosin 0.4 MG Caps capsule Commonly known as: FLOMAX Take 0.8 mg by mouth daily.        Follow-up Information     Borum, Vista Mink, MD Follow up.   Specialty: Internal Medicine Contact information: [address] [facility] [address] [facility] [place] [address] [phone]                Discharge Exam: Ceasar Mons Weights   08/11/22 1716  Weight: 59 kg  BP 126/84 (BP Location: Right Arm)   Pulse 87   Temp 98.2 F (36.8 C)   Resp 16   Ht 5\' 7"  (1.702 m)   Wt 59 kg   SpO2 90% Comment: applied 2 L [place] post neb given with oxygen.  BMI 20.36 kg/m   Pleasant frail male in no distress Clear, nonlabored  Condition at discharge: stable  The results of significant diagnostics from this hospitalization (including imaging, microbiology, ancillary and laboratory) are listed below for reference.   Imaging Studies: CT Chest High Resolution  Result Date: 08/12/2022 CLINICAL DATA:  Hypoxia EXAM:  CT CHEST WITHOUT CONTRAST TECHNIQUE: Multidetector CT imaging of the chest was performed following the standard protocol without intravenous contrast. High resolution imaging of the lungs, as well as inspiratory and expiratory imaging, was performed. RADIATION DOSE REDUCTION: This exam was performed according to the departmental dose-optimization program which includes automated exposure control, adjustment of the mA and/or kV according to patient size and/or use of iterative reconstruction technique. COMPARISON:  None Available. FINDINGS: Cardiovascular: Normal heart size. No pericardial effusion. Severe left main and three-vessel coronary artery calcifications. Normal caliber thoracic aorta with severe calcified plaque. Mediastinum/Nodes: Esophagus and thyroid are unremarkable. No enlarged lymph nodes seen in the chest. Lungs/Pleura: Central airways are patent. No evidence of air trapping. Bilateral bronchial wall thickening scattered areas of mucous plugging. Moderate centrilobular and paraseptal emphysema. Cavitary nodule of the left upper lobe measuring 1.8 x 1.3 cm on series 4, image 142 with associated metallic density which is likely a fiducial marker.  Adjacent linear opacities, possibly posttreatment change. No pleural effusion or pneumothorax. Upper Abdomen: Tiny low-attenuation lesion of the hepatic dome measuring 6 mm on series 2, image 138, too small to accurately characterize, but likely a simple cyst. Pancreatic head calcifications, likely sequela of chronic pancreatitis. Musculoskeletal: No chest wall mass or suspicious bone lesions identified. IMPRESSION: 1. Cavitary nodule of the left upper lobe with associated metallic density which is likely a fiducial marker. Correlate for history of treated primary lung malignancy and recommend comparison with outside prior chest imaging to confirm stability (addendum can be made to this report if prior imaging can be obtained). If outside imaging can not be  obtained, recommend short-term follow-up chest CT in 3 months. 2. Bilateral bronchial wall thickening and scattered areas of mucous plugging, findings can be seen in the setting of bronchitis. 3. Severe left main and three-vessel coronary artery calcifications. 4. Pancreatic head calcifications, likely sequela of chronic pancreatitis. 5. Aortic Atherosclerosis (ICD10-I70.0) and Emphysema (ICD10-J43.9). Electronically Signed   By: Allegra Lai M.D.   On: 08/12/2022 20:00   DG Chest Port 1 View  Result Date: 08/11/2022 CLINICAL DATA:  Cough. EXAM: PORTABLE CHEST 1 VIEW COMPARISON:  December 03, 2017 FINDINGS: Calcific atherosclerotic disease and tortuosity of the aorta. Cardiomediastinal silhouette is normal. Mediastinal contours appear intact. There is no evidence of focal airspace consolidation, pleural effusion or pneumothorax. Osseous structures are without acute abnormality. Soft tissues are grossly normal. IMPRESSION: No evidence of focal consolidation. Electronically Signed   By: Ted Mcalpine M.D.   On: 08/11/2022 19:01    Microbiology: Results for orders placed or performed during the hospital encounter of 08/11/22  SARS Coronavirus 2 by RT PCR (hospital order, performed in [facility] hospital lab) *cepheid single result test*     Status: None   Collection Time: 08/11/22  5:48 PM   Specimen: Nasal Swab  Result Value Ref Range Status   SARS Coronavirus 2 by RT PCR NEGATIVE NEGATIVE Final    Comment: (NOTE) SARS-CoV-2 target nucleic acids are NOT DETECTED.  The SARS-CoV-2 RNA is generally detectable in upper and lower respiratory specimens during the acute phase of infection. The lowest concentration of SARS-CoV-2 viral copies this assay can detect is 250 copies / mL. A negative result does not preclude SARS-CoV-2 infection and should not be used as the sole basis for treatment or other patient management decisions.  A negative result may occur with improper specimen collection /  handling, submission of specimen other than nasopharyngeal swab, presence of viral mutation(s) within the areas targeted by this assay, and inadequate number of viral copies (<250 copies / mL). A negative result must be combined with clinical observations, patient history, and epidemiological information.  Fact Sheet for Patients:   RoadLapTop.co.za  Fact Sheet for Healthcare Providers: http://kim-miller.com/  This test is not yet approved or  cleared by the [place] FDA and has been authorized for detection and/or diagnosis of SARS-CoV-2 by FDA under an Emergency Use Authorization (EUA).  This EUA will remain in effect (meaning this test can be used) for the duration of the COVID-19 declaration under Section 564(b)(1) of the Act, 21 U.S.C. section 360bbb-3(b)(1), unless the authorization is terminated or revoked sooner.  Performed at [facility], [address], [place], [place] [address]   Blood Culture (routine x 2)     Status: None (Preliminary result)   Collection Time: 08/11/22  6:03 PM   Specimen: BLOOD  Result Value Ref Range Status   Specimen  Description   Final    BLOOD LEFT ANTECUBITAL Performed at [facility], [address] [place], [place] [address]    Special Requests   Final    BOTTLES DRAWN AEROBIC AND ANAEROBIC Blood Culture adequate volume Performed at [facility] [address] [place], [place] [address]    Culture   Final    NO GROWTH 3 DAYS Performed at [facility], [address] [place], [place] [address]    Report Status PENDING  Incomplete  Blood Culture (routine x 2)     Status: None (Preliminary result)   Collection Time: 08/11/22  6:05 PM   Specimen: BLOOD  Result Value Ref Range Status   Specimen Description   Final    BLOOD RIGHT ANTECUBITAL Performed at [facility], [address] [place], [place]  [address]    Special Requests   Final    BOTTLES DRAWN AEROBIC AND ANAEROBIC Blood Culture adequate volume Performed at [facility], [address], [place], [place] [address]    Culture   Final    NO GROWTH 3 DAYS Performed at [facility], [address], [place], [place] [address]    Report Status PENDING  Incomplete  Expectorated Sputum Assessment w Gram Stain, Rflx to Resp Cult     Status: None   Collection Time: 08/12/22  1:50 AM   Specimen: Sputum  Result Value Ref Range Status   Specimen Description SPU  Final   Special Requests   Final    NONE Results Called to: Soledad Gerlach RN @ 0308 08/12/22. GILBERTL   Sputum evaluation   Final    Sputum specimen not acceptable for testing.  Please recollect.   Performed at [facility], [address] [place], [place] [address]    Report Status 08/12/2022 FINAL  Final  Respiratory (~20 pathogens) panel by PCR     Status: None   Collection Time: 08/12/22  2:06 AM   Specimen: Nasopharyngeal Swab; Respiratory  Result Value Ref Range Status   Adenovirus NOT DETECTED NOT DETECTED Final   Coronavirus 229E NOT DETECTED NOT DETECTED Final    Comment: (NOTE) The Coronavirus on the Respiratory Panel, DOES NOT test for the novel  Coronavirus (2019 nCoV)    Coronavirus HKU1 NOT DETECTED NOT DETECTED Final   Coronavirus NL63 NOT DETECTED NOT DETECTED Final   Coronavirus OC43 NOT DETECTED NOT DETECTED Final   Metapneumovirus NOT DETECTED NOT DETECTED Final   Rhinovirus / Enterovirus NOT DETECTED NOT DETECTED Final   Influenza A NOT DETECTED NOT DETECTED Final   Influenza B NOT DETECTED NOT DETECTED Final   Parainfluenza Virus 1 NOT DETECTED NOT DETECTED Final   Parainfluenza Virus 2 NOT DETECTED NOT DETECTED Final   Parainfluenza Virus 3 NOT DETECTED NOT DETECTED Final   Parainfluenza Virus 4 NOT DETECTED NOT DETECTED Final   Respiratory Syncytial Virus NOT DETECTED NOT DETECTED Final   Bordetella  pertussis NOT DETECTED NOT DETECTED Final   Bordetella Parapertussis NOT DETECTED NOT DETECTED Final   Chlamydophila pneumoniae NOT DETECTED NOT DETECTED Final   Mycoplasma pneumoniae NOT DETECTED NOT DETECTED Final    Comment: Performed at [facility], [address], [place], [place] [address]  Expectorated Sputum Assessment w Gram Stain, Rflx to Resp Cult     Status: None   Collection Time: 08/14/22  7:01 AM   Specimen: Sputum  Result Value Ref Range Status  Specimen Description SPUTUM  Final   Special Requests NONE  Final   Sputum evaluation   Final    Sputum specimen not acceptable for testing.  Please recollect.   HELMS, A. RN @0959  ON 5.1.2024 BY [facility] Performed at [facility], [address] [place], [place] [address]    Report Status 08/14/2022 FINAL  Final    Labs: CBC: Recent Labs  Lab 08/11/22 1747 08/12/22 0541 08/13/22 0531 08/14/22 0517 08/15/22 0542  WBC 17.2* 3.8* 12.4* 10.6* 8.5  NEUTROABS 3.3 2.3 6.9 5.9 4.9  HGB 11.6* 11.2* 11.3* 11.5* 11.4*  HCT 34.4* 34.6* 34.4* 35.4* 34.5*  MCV 95.0 95.1 95.0 95.9 94.8  PLT 215 214 222 220 237   Basic Metabolic Panel: Recent Labs  Lab 08/11/22 1747 08/11/22 1918 08/12/22 0541  NA 137  --  138  K 4.0 3.5 4.5  CL 101  --  101  CO2 26  --  29  GLUCOSE 90  --  151*  BUN 8  --  7*  CREATININE 1.03  --  1.03  CALCIUM 7.5*  --  8.4*   Liver Function Tests: Recent Labs  Lab 08/11/22 1747  AST 25  ALT 16  ALKPHOS 141*  BILITOT 0.7  PROT 6.7  ALBUMIN 2.9*   CBG: No results for input(s): "GLUCAP" in the last 168 hours.  Discharge time spent: greater than 30 minutes.  Signed: Tyrone Nine, MD [facility] 08/15/2022

## 2022-08-16 LAB — CULTURE, BLOOD (ROUTINE X 2): Culture: NO GROWTH

## 2022-08-28 ENCOUNTER — Encounter: Payer: Self-pay | Admitting: Cardiovascular Disease

## 2022-08-28 ENCOUNTER — Ambulatory Visit
Payer: No Typology Code available for payment source | Attending: Cardiovascular Disease | Admitting: Cardiovascular Disease

## 2022-08-28 VITALS — BP 108/62 | HR 101 | Ht 67.0 in | Wt 128.0 lb

## 2022-08-28 DIAGNOSIS — E782 Mixed hyperlipidemia: Secondary | ICD-10-CM

## 2022-08-28 DIAGNOSIS — I739 Peripheral vascular disease, unspecified: Secondary | ICD-10-CM

## 2022-08-28 DIAGNOSIS — I10 Essential (primary) hypertension: Secondary | ICD-10-CM | POA: Diagnosis not present

## 2022-08-28 DIAGNOSIS — Z720 Tobacco use: Secondary | ICD-10-CM

## 2022-08-28 NOTE — Progress Notes (Signed)
08/28/2022 Nathan Spiller Sr.   07/10/1950  [id number]  Primary Physician Borum, Vista Mink, MD Primary Cardiologist: Runell Gess MD [name]  HPI:  Nathan Spiller Sr. is a 72 y.o.   thin appearing divorced African-American male father of 3, grandfather one grandchild referred by the [facility] for evaluation of claudication.    I last saw him in the office   11/30/2019.  He has a history of treated hypertension, untreated mild hyperlipidemia and greater than 50 pack years of tobacco abuse currently smoking 1 pack/day.  There is no family history of heart disease.  Never had a heart attack or stroke and denies chest pain or shortness of breath.  He has complained of right greater than left lower extremity lifestyle limiting claudication less than 1 block for the last year and was referred here for further evaluation of this.  I performed peripheral angiography 12/04/2017 revealing bilateral iliac disease as well as right common femoral SFA and popliteal disease.  I performed diamondback orbital rotational atherectomy, PTA and covered stenting (VBX) of his left common and external iliac artery.  His ABIs did not change but his velocities improved.  His claudication on the left has markedly improved as well.    He did have residual right common and external iliac artery calcified high-grade stenoses as well as an occluded right common femoral artery that was calcified, focal mid right SFA and popliteal artery stenosis.   I referred him to Dr. Myra Gianotti who performed endarterectomy and patch angioplasty as well as diamondback orbital rotational atherectomy and stenting of his right common and external neck artery as well as his right common femoral artery with an excellent result.  His wound is well-healed.  His Dopplers have improved.  His claudication has resolved.  He is on aspirin Plavix.  He is reduced his smoking from 1 pack to 1/2 pack/day, and most  recently to 1 pack/week.   Since I saw him 3 years ago he has continued to do well.  He was recently hospitalized with bronchitis and has had pancreatic insufficiency as well.  He stopped smoking several weeks ago after his hospitalization.  Denies chest pain or shortness of breath or claudication.   Current Meds  Medication Sig   acetaminophen (TYLENOL) 500 MG tablet Take 500-1,000 mg by mouth 3 (three) times daily as needed for mild pain, headache or moderate pain.   allopurinol (ZYLOPRIM) 300 MG tablet Take 300 mg by mouth daily.   amLODipine (NORVASC) 5 MG tablet Take 5 mg by mouth daily.   aspirin EC 81 MG tablet Take 81 mg by mouth daily.   atorvastatin (LIPITOR) 80 MG tablet Take 1 tablet (80 mg total) by mouth daily at 6 PM.   buPROPion (ZYBAN) 150 MG 12 hr tablet Take 150 mg by mouth every other day.   clopidogrel (PLAVIX) 75 MG tablet Take 1 tablet (75 mg total) by mouth daily with breakfast.   diclofenac Sodium (VOLTAREN) 1 % GEL Apply 2 g topically 4 (four) times daily as needed (pain).   finasteride (PROSCAR) 5 MG tablet Take 5 mg by mouth daily.   latanoprost (XALATAN) 0.005 % ophthalmic solution Place 1 drop into both eyes at bedtime.   predniSONE (DELTASONE) 10 MG tablet Take 3 tablets (30 mg total) by mouth daily with breakfast for 14 days, THEN 3 tablets (30 mg total) every other day for 14 days. THEN refer to pulmonology recommendations.  sulfamethoxazole-trimethoprim (BACTRIM DS) 800-160 MG tablet Take 1 tablet by mouth 3 (three) times a week.   tamsulosin (FLOMAX) 0.4 MG CAPS capsule Take 0.8 mg by mouth daily.     No Known Allergies  Social History   Socioeconomic History   Marital status: Divorced    Spouse name: Not on file   Number of children: Not on file   Years of education: Not on file   Highest education level: Not on file  Occupational History   Not on file  Tobacco Use   Smoking status: Every Day    Packs/day: 0.25    Years: 40.00    Additional  pack years: 0.00    Total pack years: 10.00    Types: Cigarettes    Last attempt to quit: 12/04/2017    Years since quitting: 4.7   Smokeless tobacco: Never  Vaping Use   Vaping Use: Never used  Substance and Sexual Activity   Alcohol use: Yes    Comment:  40 ounce a week   Drug use: Yes    Types: Marijuana    Comment: 01/06/18 - last time was 1 week ago   Sexual activity: Not on file  Other Topics Concern   Not on file  Social History Narrative   Not on file   Social Determinants of Health   Financial Resource Strain: Not on file  Food Insecurity: No Food Insecurity (08/11/2022)   Hunger Vital Sign    Worried About Running Out of Food in the Last Year: Never true    Ran Out of Food in the Last Year: Never true  Transportation Needs: No Transportation Needs (08/11/2022)   PRAPARE - Administrator, Civil Service (Medical): No    Lack of Transportation (Non-Medical): No  Physical Activity: Not on file  Stress: Not on file  Social Connections: Not on file  Intimate Partner Violence: Not At Risk (08/11/2022)   Humiliation, Afraid, Rape, and Kick questionnaire    Fear of Current or Ex-Partner: No    Emotionally Abused: No    Physically Abused: No    Sexually Abused: No     Review of Systems: General: negative for chills, fever, night sweats or weight changes.  Cardiovascular: negative for chest pain, dyspnea on exertion, edema, orthopnea, palpitations, paroxysmal nocturnal dyspnea or shortness of breath Dermatological: negative for rash Respiratory: negative for cough or wheezing Urologic: negative for hematuria Abdominal: negative for nausea, vomiting, diarrhea, bright red blood per rectum, melena, or hematemesis Neurologic: negative for visual changes, syncope, or dizziness All other systems reviewed and are otherwise negative except as noted above.    Blood pressure 108/62, pulse (!) 101, height 5\' 7"  (1.702 m), weight 128 lb (58.1 kg), SpO2 95 %.  General  appearance: alert and no distress Neck: no adenopathy, no carotid bruit, no JVD, supple, symmetrical, trachea midline, and thyroid not enlarged, symmetric, no tenderness/mass/nodules Lungs: clear to auscultation bilaterally Heart: regular rate and rhythm, S1, S2 normal, no murmur, click, rub or gallop Extremities: extremities normal, atraumatic, no cyanosis or edema Pulses: 2+ and symmetric Skin: Skin color, texture, turgor normal. No rashes or lesions Neurologic: Grossly normal  EKG not performed today  ASSESSMENT AND PLAN:   Essential hypertension History of essential hypertension blood pressure measured today at 108/62.  He is on amlodipine.  Hyperlipidemia Hyperlipidemia on statin therapy followed at the [facility].  Tobacco abuse Long history of tobacco use having discontinued smoking several weeks ago after an admission for  bronchitis.  PAD (peripheral artery disease) (HCC) History of PAD status post peripheral angiography which I performed 12/04/2017 revealing bilateral iliac disease as well as right common femoral and SFA as well as popliteal disease.  I performed Dynabac orbital rotational atherectomy, PTA and covered stenting (VBX stent) of the left common and extrailiac arteries.  His ABI has not changed but is velocities improved and his claudication on the left improved as well.  I referred him to Dr. Myra Gianotti who performed right common femoral endarterectomy, patch angioplasty as well as orbital atherectomy and stenting of the right common and external iliac artery.  Dopplers improved.  His claudication resolved.  His last Doppler studies performed 01/12/2021 revealed widely patent stents bilaterally.  He currently denies claudication.     Runell Gess MD FACP,FACC,FAHA, [facility] 08/28/2022 3:42 PM

## 2022-08-28 NOTE — Assessment & Plan Note (Signed)
History of PAD status post peripheral angiography which I performed 12/04/2017 revealing bilateral iliac disease as well as right common femoral and SFA as well as popliteal disease.  I performed Dynabac orbital rotational atherectomy, PTA and covered stenting (VBX stent) of the left common and extrailiac arteries.  His ABI has not changed but is velocities improved and his claudication on the left improved as well.  I referred him to Dr. Myra Gianotti who performed right common femoral endarterectomy, patch angioplasty as well as orbital atherectomy and stenting of the right common and external iliac artery.  Dopplers improved.  His claudication resolved.  His last Doppler studies performed 01/12/2021 revealed widely patent stents bilaterally.  He currently denies claudication.

## 2022-08-28 NOTE — Assessment & Plan Note (Signed)
Long history of tobacco use having discontinued smoking several weeks ago after an admission for bronchitis.

## 2022-08-28 NOTE — Patient Instructions (Signed)
Medication Instructions:  Your physician recommends that you continue on your current medications as directed. Please refer to the Current Medication list given to you today.  *If you need a refill on your cardiac medications before your next appointment, please call your pharmacy*   Testing/Procedures: Your physician has requested that you have an Aorta/Iliac Duplex. This will be take place at [address] [facility], [address].  No food after 11PM the night before.  Water is OK. (Don't drink liquids if you have been instructed not to for ANOTHER test) Avoid foods that produce bowel gas, for 24 hours prior to exam (see below). No breakfast, no chewing gum, no smoking or carbonated beverages. Patient may take morning medications with water. Come in for test at least 15 minutes early to register.  Your physician has requested that you have a lower extremity arterial duplex. During this test, ultrasound is used to evaluate arterial blood flow in the legs. Allow one hour for this exam. There are no restrictions or special instructions. This will take place at [address] [facility], [address].  Your physician has requested that you have an ankle brachial index (ABI). During this test an ultrasound and blood pressure cuff are used to evaluate the arteries that supply the arms and legs with blood. Allow thirty minutes for this exam. There are no restrictions or special instructions. This will take place at [address] [facility], [address].      Follow-Up: At [facility], you and your health needs are our priority.  As part of our continuing mission to provide you with exceptional heart care, we have created designated Provider Care Teams.  These Care Teams include your primary Cardiologist (physician) and Advanced Practice Providers (APPs -  Physician Assistants and Nurse Practitioners) who all work together to provide you with the care you need, when you need it.  We recommend signing up for the  patient portal called "MyChart".  Sign up information is provided on this After Visit Summary.  MyChart is used to connect with patients for Virtual Visits (Telemedicine).  Patients are able to view lab/test results, encounter notes, upcoming appointments, etc.  Non-urgent messages can be sent to your provider as well.   To learn more about what you can do with MyChart, go to [email or web address].    Your next appointment:   12 month(s)  Provider:   Nanetta Batty, MD

## 2022-08-28 NOTE — Assessment & Plan Note (Signed)
History of essential hypertension blood pressure measured today at 108/62.  He is on amlodipine. 

## 2022-08-28 NOTE — Assessment & Plan Note (Signed)
Hyperlipidemia on statin therapy followed at the [facility].

## 2022-09-03 ENCOUNTER — Other Ambulatory Visit: Payer: Self-pay | Admitting: Cardiovascular Disease

## 2022-09-03 DIAGNOSIS — I739 Peripheral vascular disease, unspecified: Secondary | ICD-10-CM

## 2022-09-03 DIAGNOSIS — Z720 Tobacco use: Secondary | ICD-10-CM

## 2022-09-03 DIAGNOSIS — E782 Mixed hyperlipidemia: Secondary | ICD-10-CM

## 2022-09-03 DIAGNOSIS — I10 Essential (primary) hypertension: Secondary | ICD-10-CM

## 2022-09-20 ENCOUNTER — Ambulatory Visit (HOSPITAL_COMMUNITY): Payer: No Typology Code available for payment source

## 2022-10-09 ENCOUNTER — Ambulatory Visit (HOSPITAL_COMMUNITY): Payer: No Typology Code available for payment source

## 2022-10-09 ENCOUNTER — Ambulatory Visit (HOSPITAL_BASED_OUTPATIENT_CLINIC_OR_DEPARTMENT_OTHER)
Admission: RE | Admit: 2022-10-09 | Discharge: 2022-10-09 | Disposition: A | Discharge status: Home / Self Care | Payer: No Typology Code available for payment source | Source: Ambulatory Visit | Attending: Cardiovascular Disease | Admitting: Cardiovascular Disease

## 2022-10-09 ENCOUNTER — Ambulatory Visit (HOSPITAL_COMMUNITY)
Admission: RE | Admit: 2022-10-09 | Discharge: 2022-10-09 | Disposition: A | Discharge status: Home / Self Care | Payer: No Typology Code available for payment source | Source: Ambulatory Visit | Attending: Cardiovascular Disease | Admitting: Cardiovascular Disease

## 2022-10-09 DIAGNOSIS — I739 Peripheral vascular disease, unspecified: Secondary | ICD-10-CM | POA: Insufficient documentation

## 2022-10-09 DIAGNOSIS — Z95828 Presence of other vascular implants and grafts: Secondary | ICD-10-CM | POA: Diagnosis present

## 2022-10-09 LAB — VAS US ABI WITH/WO TBI

## 2022-10-10 ENCOUNTER — Encounter (HOSPITAL_COMMUNITY): Payer: No Typology Code available for payment source

## 2022-10-10 ENCOUNTER — Encounter: Payer: Self-pay | Admitting: *Deleted

## 2022-10-10 LAB — VAS US ABI WITH/WO TBI

## 2022-10-11 ENCOUNTER — Encounter: Payer: Self-pay | Admitting: *Deleted

## 2023-03-26 ENCOUNTER — Telehealth (HOSPITAL_COMMUNITY): Payer: Self-pay

## 2023-03-26 NOTE — Telephone Encounter (Signed)
Received office notes from the [place] for pt for pulmonary rehab. Deanna Artis did not send over auth for pt for pulmonary rehab. [facility] [place] and was on hold for 2 hours before the automated system stated that they were having issues with the phone system. I called pt to advise him that he need to let the [place] know that we are needing a [place] auth for him to participate in the pulmonary rehab before we are able to schedule him. Pt understood and stated that he will let them know to send an auth over.

## 2023-04-01 ENCOUNTER — Telehealth (HOSPITAL_COMMUNITY): Payer: Self-pay

## 2023-04-01 NOTE — Telephone Encounter (Signed)
Received the auth from the [place] for pt to participate in the pulmonary rehab program. Passed pt information to nurse navigator for review.

## 2023-04-03 ENCOUNTER — Other Ambulatory Visit (HOSPITAL_COMMUNITY): Payer: Self-pay

## 2023-04-03 ENCOUNTER — Telehealth (HOSPITAL_COMMUNITY): Payer: Self-pay

## 2023-04-03 DIAGNOSIS — L84 Corns and callosities: Secondary | ICD-10-CM | POA: Insufficient documentation

## 2023-04-03 DIAGNOSIS — H2513 Age-related nuclear cataract, bilateral: Secondary | ICD-10-CM | POA: Insufficient documentation

## 2023-04-03 DIAGNOSIS — R0789 Other chest pain: Secondary | ICD-10-CM | POA: Insufficient documentation

## 2023-04-03 DIAGNOSIS — K2970 Gastritis, unspecified, without bleeding: Secondary | ICD-10-CM | POA: Insufficient documentation

## 2023-04-03 DIAGNOSIS — B4481 Allergic bronchopulmonary aspergillosis: Secondary | ICD-10-CM | POA: Insufficient documentation

## 2023-04-03 DIAGNOSIS — K852 Alcohol induced acute pancreatitis without necrosis or infection: Secondary | ICD-10-CM | POA: Insufficient documentation

## 2023-04-03 DIAGNOSIS — M5412 Radiculopathy, cervical region: Secondary | ICD-10-CM | POA: Insufficient documentation

## 2023-04-03 DIAGNOSIS — N400 Enlarged prostate without lower urinary tract symptoms: Secondary | ICD-10-CM | POA: Insufficient documentation

## 2023-04-03 DIAGNOSIS — J189 Pneumonia, unspecified organism: Secondary | ICD-10-CM | POA: Insufficient documentation

## 2023-04-03 DIAGNOSIS — K860 Alcohol-induced chronic pancreatitis: Secondary | ICD-10-CM | POA: Insufficient documentation

## 2023-04-03 DIAGNOSIS — M2020 Hallux rigidus, unspecified foot: Secondary | ICD-10-CM | POA: Insufficient documentation

## 2023-04-03 NOTE — Telephone Encounter (Signed)
Called patient to see if he was interested in participating in the Pulmonary Rehab Program. Patient stated yes. Patient will come in for orientation on 04/04/23 @ 9AM and will attend the 1:15PM exercise class.

## 2023-04-04 ENCOUNTER — Encounter (HOSPITAL_COMMUNITY): Payer: Self-pay

## 2023-04-04 ENCOUNTER — Encounter (HOSPITAL_COMMUNITY)
Admission: RE | Admit: 2023-04-04 | Discharge: 2023-04-04 | Disposition: A | Discharge status: Home / Self Care | Payer: No Typology Code available for payment source | Source: Ambulatory Visit | Attending: Pulmonary Disease | Admitting: Pulmonary Disease

## 2023-04-04 VITALS — BP 104/66 | HR 100 | Ht 68.5 in | Wt 129.9 lb

## 2023-04-04 DIAGNOSIS — J449 Chronic obstructive pulmonary disease, unspecified: Secondary | ICD-10-CM | POA: Diagnosis present

## 2023-04-04 NOTE — Progress Notes (Signed)
Nathan Spiller Sr. 72 y.o. male Pulmonary Rehab Orientation Note This patient who was referred to Pulmonary Rehab by Dr. Charline Bills with the diagnosis of COPD, unspecified arrived today in Cardiac and Pulmonary Rehab. He arrived ambulatory with normal gait. He does not carry portable oxygen. Per patient, Nathan Palmer uses oxygen never. Color good, skin warm and dry. Patient is oriented to time and place. Patient's medical history, psychosocial health, and medications reviewed. Psychosocial assessment reveals patient lives with alone. Nathan Palmer is currently retired. Patient hobbies include watching tv and collecting wood for wood stove. Patient reports his stress level is low. Areas of stress/anxiety include N/A. Patient does not exhibit signs of depression. PHQ2/9 score 0/0. Nathan Palmer shows good  coping skills with positive outlook on life. Offered emotional support and reassurance. Will continue to monitor. Physical assessment performed by Nathan Hart RN. Please see their orientation physical assessment note. Nathan Palmer reports he  does take medications as prescribed. Patient states he  follows a regular  diet. The patient reports no specific efforts to gain or lose weight.. Patient's weight will be monitored closely. Demonstration and practice of PLB using pulse oximeter. Nathan Palmer able to return demonstration satisfactorily. Safety and hand hygiene in the exercise area reviewed with patient. Nathan Palmer voices understanding of the information reviewed. Department expectations discussed with patient and achievable goals were set. The patient shows enthusiasm about attending the program and we look forward to working with Nathan Palmer completed a 6 min walk test today and is scheduled to begin exercise on 04/08/23.   [id number] Nathan [place], [place], ACSM-CEP

## 2023-04-04 NOTE — Progress Notes (Signed)
Nathan Spiller Sr. 72 y.o. male  Initial Psychosocial Assessment  Pt psychosocial assessment reveals pt lives alone. Pt is currently retired. Pt hobbies include watching tv and collecting wood for wood stove. Pt reports his  stress level is low. Areas of stress/anxiety include N/A.  Pt does not exhibit signs of depression. Pt shows good  coping skills with positive outlook . Offered emotional support and reassurance. Will continue to monitor for psychosocial needs.    04/04/2023 10:34 AM

## 2023-04-04 NOTE — Progress Notes (Signed)
Pulmonary Rehab Orientation Physical Assessment Note  Physical assessment reveals patient is alert and oriented x 4. Heart rate is normal, breath sounds diminished to auscultation, no wheezes, rales, or rhonchi. Pt reports non-productive cough after smoking. Bowel sounds present x4 quads. Pt denies abdominal discomfort, nausea, vomiting, diarrhea or constipation. Grip strength equal, strong. Distal pulses +2; no swelling to lower extremities.   Essie Hart, RN, BSN

## 2023-04-04 NOTE — Progress Notes (Signed)
Pulmonary Individual Treatment Plan  Patient Details  Name: Nathan AURINGER Sr. MRN: [id number] Date of Birth: [birth year] Referring Provider:   Doristine Devoid Pulmonary Rehab Walk Test from 04/04/2023 in [facility]  Referring Provider [name]       Initial Encounter Date:  Flowsheet Row Pulmonary Rehab Walk Test from 04/04/2023 in [facility]  Date 04/04/23       Visit Diagnosis: Chronic obstructive pulmonary disease, unspecified COPD type (HCC)  Patient's Home Medications on Admission:   Current Outpatient Medications:    acetaminophen (TYLENOL) 500 MG tablet, Take 500-1,000 mg by mouth 3 (three) times daily as needed for mild pain, headache or moderate pain., Disp: , Rfl:    albuterol (VENTOLIN HFA) 108 (90 Base) MCG/ACT inhaler, Inhale into the lungs., Disp: , Rfl:    amLODipine (NORVASC) 5 MG tablet, Take 5 mg by mouth daily., Disp: , Rfl:    aspirin EC 81 MG tablet, Take 81 mg by mouth daily., Disp: , Rfl:    atorvastatin (LIPITOR) 80 MG tablet, Take 1 tablet (80 mg total) by mouth daily at 6 PM., Disp: 90 tablet, Rfl: 3   clopidogrel (PLAVIX) 75 MG tablet, Take 1 tablet (75 mg total) by mouth daily with breakfast., Disp: 90 tablet, Rfl: 3   docusate sodium (COLACE) 100 MG capsule, Take 200 mg by mouth daily., Disp: , Rfl:    finasteride (PROSCAR) 5 MG tablet, Take 5 mg by mouth daily., Disp: , Rfl:    fluticasone (FLONASE) 50 MCG/ACT nasal spray, Place 2 sprays into both nostrils daily., Disp: , Rfl:    Mometasone Furoate 200 MCG/ACT AERO, Take by mouth., Disp: , Rfl:    montelukast (SINGULAIR) 10 MG tablet, Take 1 tablet by mouth every evening., Disp: , Rfl:    tamsulosin (FLOMAX) 0.4 MG CAPS capsule, Take 2 capsules by mouth daily., Disp: , Rfl:    Tiotropium Bromide-Olodaterol 2.5-2.5 MCG/ACT AERS, Take by mouth., Disp: , Rfl:    allopurinol  (ZYLOPRIM) 300 MG tablet, Take 300 mg by mouth daily. (Patient not taking: Reported on 04/04/2023), Disp: , Rfl:    azithromycin (ZITHROMAX) 250 MG tablet, Take by mouth. (Patient not taking: Reported on 04/04/2023), Disp: , Rfl:    buPROPion (ZYBAN) 150 MG 12 hr tablet, Take 150 mg by mouth every other day. (Patient not taking: Reported on 04/04/2023), Disp: , Rfl:    diclofenac Sodium (VOLTAREN) 1 % GEL, Apply 2 g topically 4 (four) times daily as needed (pain). (Patient not taking: Reported on 04/04/2023), Disp: , Rfl:    ipratropium (ATROVENT) 0.03 % nasal spray, Place into the nose. (Patient not taking: Reported on 04/04/2023), Disp: , Rfl:    latanoprost (XALATAN) 0.005 % ophthalmic solution, Place 1 drop into both eyes at bedtime. (Patient not taking: Reported on 04/04/2023), Disp: , Rfl:    nicotine polacrilex (COMMIT) 2 MG lozenge, Take by mouth. (Patient not taking: Reported on 04/04/2023), Disp: , Rfl:    omeprazole (PRILOSEC OTC) 20 MG tablet, Take 1 tablet (20 mg total) by mouth daily., Disp: 30 tablet, Rfl: 2   predniSONE (DELTASONE) 20 MG tablet, Take by mouth. (Patient not taking: Reported on 04/04/2023), Disp: , Rfl:    sulfamethoxazole-trimethoprim (BACTRIM DS) 800-160 MG tablet, Take 1 tablet by mouth 3 (three) times a week. (Patient not taking: Reported on 04/04/2023), Disp: 12 tablet, Rfl: 0  Past Medical History:  Past Medical History:  Diagnosis Date   Arthritis    Glaucoma    Hyperlipidemia    Hypertension    Peripheral vascular disease (HCC)     Tobacco Use: Social History   Tobacco Use  Smoking Status Every Day   Current packs/day: 0.00   Average packs/day: 0.3 packs/day for 40.0 years (10.0 ttl pk-yrs)   Types: Cigarettes   Start date: 12/04/1977   Last attempt to quit: 12/04/2017   Years since quitting: 5.3  Smokeless Tobacco Never  Tobacco Comments   Smokes 3-4 cigs/ day    Labs: Review Flowsheet       Latest Ref Rng & Units 12/29/2017 02/03/2022   Labs for ITP Cardiac and Pulmonary Rehab  Cholestrol 100 - 199 mg/dL 191  -  LDL (calc) 0 - 99 mg/dL 18  -  HDL-C >47 mg/dL 46  -  Trlycerides <829 mg/dL 562  88     Capillary Blood Glucose: No results found for: "GLUCAP"   Pulmonary Assessment Scores:  Pulmonary Assessment Scores     Row Name 04/04/23 0922         ADL UCSD   ADL Phase Entry     SOB Score total 61       CAT Score   CAT Score 18       mMRC Score   mMRC Score 4             UCSD: Self-administered rating of dyspnea associated with activities of daily living (ADLs) 6-point scale (0 = "not at all" to 5 = "maximal or unable to do because of breathlessness")  Scoring Scores range from 0 to 120.  Minimally important difference is 5 units  CAT: CAT can identify the health impairment of COPD patients and is better correlated with disease progression.  CAT has a scoring range of zero to 40. The CAT score is classified into four groups of low (less than 10), medium (10 - 20), high (21-30) and very high (31-40) based on the impact level of disease on health status. A CAT score over 10 suggests significant symptoms.  A worsening CAT score could be explained by an exacerbation, poor medication adherence, poor inhaler technique, or progression of COPD or comorbid conditions.  CAT MCID is 2 points  mMRC: mMRC (Modified Medical Research Council) Dyspnea Scale is used to assess the degree of baseline functional disability in patients of respiratory disease due to dyspnea. No minimal important difference is established. A decrease in score of 1 point or greater is considered a positive change.   Pulmonary Function Assessment:  Pulmonary Function Assessment - 04/04/23 0922       Breath   Shortness of Breath Yes;Limiting activity             Exercise Target Goals: Exercise Program Goal: Individual exercise prescription set using results from initial 6 min walk test and THRR while considering  patient's  activity barriers and safety.   Exercise Prescription Goal: Initial exercise prescription builds to 30-45 minutes a day of aerobic activity, 2-3 days per week.  Home exercise guidelines will be given to patient during program as part of exercise prescription that the participant will acknowledge.  Activity Barriers & Risk Stratification:  Activity Barriers & Cardiac Risk Stratification - 04/04/23 0925       Activity Barriers & Cardiac Risk Stratification   Activity Barriers Deconditioning;Muscular Weakness;Shortness of Breath;History of Falls;Balance Concerns             6  Minute Walk:  6 Minute Walk     Row Name 04/04/23 1011         6 Minute Walk   Phase Initial     Distance 960 feet     Walk Time 6 minutes     # of Rest Breaks 0     MPH 1.82     METS 2.54     RPE 11     Perceived Dyspnea  1     VO2 Peak 8.9     Symptoms No     Resting HR 100 bpm     Resting BP 104/66     Resting Oxygen Saturation  100 %     Exercise Oxygen Saturation  during 6 min walk 97 %     Max Ex. HR 106 bpm     Max Ex. BP 102/62     2 Minute Post BP 110/64       Interval HR   1 Minute HR 100     2 Minute HR 101     3 Minute HR 106     4 Minute HR 100     5 Minute HR 98     6 Minute HR 98     2 Minute Post HR 95     Interval Heart Rate? Yes       Interval Oxygen   Interval Oxygen? Yes     Baseline Oxygen Saturation % 100 %     1 Minute Oxygen Saturation % 98 %     1 Minute Liters of Oxygen 0 L     2 Minute Oxygen Saturation % 97 %     2 Minute Liters of Oxygen 0 L     3 Minute Oxygen Saturation % 97 %     3 Minute Liters of Oxygen 0 L     4 Minute Oxygen Saturation % 97 %     4 Minute Liters of Oxygen 0 L     5 Minute Oxygen Saturation % 97 %     5 Minute Liters of Oxygen 0 L     6 Minute Oxygen Saturation % 97 %     6 Minute Liters of Oxygen 0 L     2 Minute Post Oxygen Saturation % 99 %     2 Minute Post Liters of Oxygen 0 L              Oxygen Initial  Assessment:  Oxygen Initial Assessment - 04/04/23 0921       Home Oxygen   Home Oxygen Device None    Sleep Oxygen Prescription None    Home Exercise Oxygen Prescription None    Home Resting Oxygen Prescription None      Initial 6 min Walk   Oxygen Used None      Program Oxygen Prescription   Program Oxygen Prescription None      Intervention   Short Term Goals To learn and understand importance of maintaining oxygen saturations>88%;To learn and demonstrate proper use of respiratory medications;To learn and understand importance of monitoring SPO2 with pulse oximeter and demonstrate accurate use of the pulse oximeter.;To learn and demonstrate proper pursed lip breathing techniques or other breathing techniques.     Long  Term Goals Verbalizes importance of monitoring SPO2 with pulse oximeter and return demonstration;Maintenance of O2 saturations>88%;Exhibits proper breathing techniques, such as pursed lip breathing or other method taught during program session;Compliance with respiratory medication;Demonstrates proper use of MDI's  Oxygen Re-Evaluation:   Oxygen Discharge (Final Oxygen Re-Evaluation):   Initial Exercise Prescription:  Initial Exercise Prescription - 04/04/23 1000       Date of Initial Exercise RX and Referring Provider   Date 04/04/23    Referring Provider Vivia Budge   Expected Discharge Date 07/01/23      Recumbant Bike   Level 1    RPM 63    Watts 38    Minutes 15    METs 2      NuStep   Level 1    SPM 79    Minutes 15    METs 2.1      Prescription Details   Frequency (times per week) 2    Duration Progress to 30 minutes of continuous aerobic without signs/symptoms of physical distress      Intensity   THRR 40-80% of Max Heartrate 59-118    Ratings of Perceived Exertion 11-13    Perceived Dyspnea 0-4      Progression   Progression Continue to progress workloads to maintain intensity without signs/symptoms of physical  distress.      Resistance Training   Training Prescription Yes    Weight blue bands    Reps 10-15             Perform Capillary Blood Glucose checks as needed.  Exercise Prescription Changes:   Exercise Comments:   Exercise Goals and Review:   Exercise Goals     Row Name 04/04/23 1610             Exercise Goals   Increase Physical Activity Yes       Intervention Provide advice, education, support and counseling about physical activity/exercise needs.;Develop an individualized exercise prescription for aerobic and resistive training based on initial evaluation findings, risk stratification, comorbidities and participant's personal goals.       Expected Outcomes Short Term: Attend rehab on a regular basis to increase amount of physical activity.;Long Term: Exercising regularly at least 3-5 days a week.;Long Term: Add in home exercise to make exercise part of routine and to increase amount of physical activity.       Increase Strength and Stamina Yes       Intervention Provide advice, education, support and counseling about physical activity/exercise needs.;Develop an individualized exercise prescription for aerobic and resistive training based on initial evaluation findings, risk stratification, comorbidities and participant's personal goals.       Expected Outcomes Short Term: Increase workloads from initial exercise prescription for resistance, speed, and METs.;Short Term: Perform resistance training exercises routinely during rehab and add in resistance training at home;Long Term: Improve cardiorespiratory fitness, muscular endurance and strength as measured by increased METs and functional capacity ( )       Able to understand and use rate of perceived exertion (RPE) scale Yes       Intervention Provide education and explanation on how to use RPE scale       Expected Outcomes Short Term: Able to use RPE daily in rehab to express subjective intensity level;Long Term:  Able  to use RPE to guide intensity level when exercising independently       Able to understand and use Dyspnea scale Yes       Intervention Provide education and explanation on how to use Dyspnea scale       Expected Outcomes Short Term: Able to use Dyspnea scale daily in rehab to express subjective sense of shortness of breath during exertion;Long Term: Able to  use Dyspnea scale to guide intensity level when exercising independently       Knowledge and understanding of Target Heart Rate Range (THRR) Yes       Intervention Provide education and explanation of THRR including how the numbers were predicted and where they are located for reference       Expected Outcomes Short Term: Able to state/look up THRR;Long Term: Able to use THRR to govern intensity when exercising independently;Short Term: Able to use daily as guideline for intensity in rehab       Understanding of Exercise Prescription Yes       Intervention Provide education, explanation, and written materials on patient's individual exercise prescription       Expected Outcomes Short Term: Able to explain program exercise prescription;Long Term: Able to explain home exercise prescription to exercise independently                Exercise Goals Re-Evaluation :  Exercise Goals Re-Evaluation     Row Name 04/04/23 0923             Exercise Goal Re-Evaluation   Exercise Goals Review Increase Physical Activity;Able to understand and use Dyspnea scale;Understanding of Exercise Prescription;Increase Strength and Stamina;Knowledge and understanding of Target Heart Rate Range (THRR);Able to understand and use rate of perceived exertion (RPE) scale       Comments Reggie is scheduled to begin exercise on 12/24.       Expected Outcomes Through exercise in rehab and at home, the patient will decrease shortness of breath with daily activities and feel confident in doing an exercise regimen at home.                Discharge Exercise  Prescription (Final Exercise Prescription Changes):   Nutrition:  Target Goals: Understanding of nutrition guidelines, daily intake of sodium 1500mg , cholesterol 200mg , calories 30% from fat and 7% or less from saturated fats, daily to have 5 or more servings of fruits and vegetables.  Biometrics:   Renee Ramus - 04/04/23 0909        Post  Biometrics   Grip Strength 22 kg             Nutrition Therapy Plan and Nutrition Goals:   Nutrition Assessments:  MEDIFICTS Score Key: >=70 Need to make dietary changes  40-70 Heart Healthy Diet <= 40 Therapeutic Level Cholesterol Diet   Picture Your Plate Scores: <30 Unhealthy dietary pattern with much room for improvement. 41-50 Dietary pattern unlikely to meet recommendations for good health and room for improvement. 51-60 More healthful dietary pattern, with some room for improvement.  >60 Healthy dietary pattern, although there may be some specific behaviors that could be improved.    Nutrition Goals Re-Evaluation:   Nutrition Goals Discharge (Final Nutrition Goals Re-Evaluation):   Psychosocial: Target Goals: Acknowledge presence or absence of significant depression and/or stress, maximize coping skills, provide positive support system. Participant is able to verbalize types and ability to use techniques and skills needed for reducing stress and depression.  Initial Review & Psychosocial Screening:  Initial Psych Review & Screening - 04/04/23 0919       Initial Review   Current issues with None Identified      Family Dynamics   Good Support System? Yes    Comments Children      Barriers   Psychosocial barriers to participate in program There are no identifiable barriers or psychosocial needs.      Screening Interventions   Interventions Encouraged to  exercise             Quality of Life Scores:  Scores of 19 and below usually indicate a poorer quality of life in these areas.  A difference of  2-3  points is a clinically meaningful difference.  A difference of 2-3 points in the total score of the Quality of Life Index has been associated with significant improvement in overall quality of life, self-image, physical symptoms, and general health in studies assessing change in quality of life.  PHQ-9: Review Flowsheet       04/04/2023  Depression screen PHQ 2/9  Decreased Interest 0  Down, Depressed, Hopeless 0  PHQ - 2 Score 0  Altered sleeping 0  Tired, decreased energy 0  Change in appetite 0  Feeling bad or failure about yourself  0  Trouble concentrating 0  Moving slowly or fidgety/restless 0  Suicidal thoughts 0  PHQ-9 Score 0   Interpretation of Total Score  Total Score Depression Severity:  1-4 = Minimal depression, 5-9 = Mild depression, 10-14 = Moderate depression, 15-19 = Moderately severe depression, 20-27 = Severe depression   Psychosocial Evaluation and Intervention:  Psychosocial Evaluation - 04/04/23 0919       Psychosocial Evaluation & Interventions   Interventions Encouraged to exercise with the program and follow exercise prescription    Comments Pt denies any psychosocial barrieres at this time. Will continue to monitor.    Expected Outcomes For Reggie to participate in rehab free of psychosocial concerns.    Continue Psychosocial Services  No Follow up required             Psychosocial Re-Evaluation:   Psychosocial Discharge (Final Psychosocial Re-Evaluation):   Education: Education Goals: Education classes will be provided on a weekly basis, covering required topics. Participant will state understanding/return demonstration of topics presented.  Learning Barriers/Preferences:  Learning Barriers/Preferences - 04/04/23 9629       Learning Barriers/Preferences   Learning Barriers Sight    Learning Preferences None             Education Topics: Know Your Numbers Group instruction that is supported by a PowerPoint presentation.  Instructor discusses importance of knowing and understanding resting, exercise, and post-exercise oxygen saturation, heart rate, and blood pressure. Oxygen saturation, heart rate, blood pressure, rating of perceived exertion, and dyspnea are reviewed along with a normal range for these values.    Exercise for the Pulmonary Patient Group instruction that is supported by a PowerPoint presentation. Instructor discusses benefits of exercise, core components of exercise, frequency, duration, and intensity of an exercise routine, importance of utilizing pulse oximetry during exercise, safety while exercising, and options of places to exercise outside of rehab.    MET Level  Group instruction provided by PowerPoint, verbal discussion, and written material to support subject matter. Instructor reviews what METs are and how to increase METs.    Pulmonary Medications Verbally interactive group education provided by instructor with focus on inhaled medications and proper administration.   Anatomy and Physiology of the Respiratory System Group instruction provided by PowerPoint, verbal discussion, and written material to support subject matter. Instructor reviews respiratory cycle and anatomical components of the respiratory system and their functions. Instructor also reviews differences in obstructive and restrictive respiratory diseases with examples of each.    Oxygen Safety Group instruction provided by PowerPoint, verbal discussion, and written material to support subject matter. There is an overview of "What is Oxygen" and "Why do we need it".  Instructor also  reviews how to create a safe environment for oxygen use, the importance of using oxygen as prescribed, and the risks of noncompliance. There is a brief discussion on traveling with oxygen and resources the patient may utilize.   Oxygen Use Group instruction provided by PowerPoint, verbal discussion, and written material to discuss how  supplemental oxygen is prescribed and different types of oxygen supply systems. Resources for more information are provided.    Breathing Techniques Group instruction that is supported by demonstration and informational handouts. Instructor discusses the benefits of pursed lip and diaphragmatic breathing and detailed demonstration on how to perform both.     Risk Factor Reduction Group instruction that is supported by a PowerPoint presentation. Instructor discusses the definition of a risk factor, different risk factors for pulmonary disease, and how the heart and lungs work together.   Pulmonary Diseases Group instruction provided by PowerPoint, verbal discussion, and written material to support subject matter. Instructor gives an overview of the different type of pulmonary diseases. There is also a discussion on risk factors and symptoms as well as ways to manage the diseases.   Stress and Energy Conservation Group instruction provided by PowerPoint, verbal discussion, and written material to support subject matter. Instructor gives an overview of stress and the impact it can have on the body. Instructor also reviews ways to reduce stress. There is also a discussion on energy conservation and ways to conserve energy throughout the day.   Warning Signs and Symptoms Group instruction provided by PowerPoint, verbal discussion, and written material to support subject matter. Instructor reviews warning signs and symptoms of stroke, heart attack, cold and flu. Instructor also reviews ways to prevent the spread of infection.   Other Education Group or individual verbal, written, or video instructions that support the educational goals of the pulmonary rehab program.    Knowledge Questionnaire Score:  Knowledge Questionnaire Score - 04/04/23 0940       Knowledge Questionnaire Score   Pre Score 12/18             Core Components/Risk Factors/Patient Goals at Admission:  Personal  Goals and Risk Factors at Admission - 04/04/23 0921       Core Components/Risk Factors/Patient Goals on Admission   Improve shortness of breath with ADL's Yes    Intervention Provide education, individualized exercise plan and daily activity instruction to help decrease symptoms of SOB with activities of daily living.    Expected Outcomes Short Term: Improve cardiorespiratory fitness to achieve a reduction of symptoms when performing ADLs;Long Term: Be able to perform more ADLs without symptoms or delay the onset of symptoms             Core Components/Risk Factors/Patient Goals Review:    Core Components/Risk Factors/Patient Goals at Discharge (Final Review):    ITP Comments: Dr. Mechele Collin is Medical Director for Pulmonary Rehab at [facility].

## 2023-04-08 ENCOUNTER — Encounter (HOSPITAL_COMMUNITY)
Admission: RE | Admit: 2023-04-08 | Discharge: 2023-04-08 | Disposition: A | Discharge status: Home / Self Care | Payer: No Typology Code available for payment source | Source: Ambulatory Visit | Attending: Pulmonary Disease | Admitting: Pulmonary Disease

## 2023-04-08 VITALS — Wt 131.4 lb

## 2023-04-08 DIAGNOSIS — J449 Chronic obstructive pulmonary disease, unspecified: Secondary | ICD-10-CM | POA: Diagnosis not present

## 2023-04-08 NOTE — Progress Notes (Signed)
Pulmonary Individual Treatment Plan  Patient Details  Name: Nathan EWERT Sr. MRN: [id number] Date of Birth: Dec 17, 1950 Referring Provider:   Doristine Devoid Pulmonary Rehab Walk Test from 04/04/2023 in [facility]  Referring Provider [name]       Initial Encounter Date:  Flowsheet Row Pulmonary Rehab Walk Test from 04/04/2023 in [facility]  Date 04/04/23       Visit Diagnosis: Chronic obstructive pulmonary disease, unspecified COPD type (HCC)  Patient's Home Medications on Admission:   Current Outpatient Medications:    acetaminophen (TYLENOL) 500 MG tablet, Take 500-1,000 mg by mouth 3 (three) times daily as needed for mild pain, headache or moderate pain., Disp: , Rfl:    albuterol (VENTOLIN HFA) 108 (90 Base) MCG/ACT inhaler, Inhale into the lungs., Disp: , Rfl:    allopurinol (ZYLOPRIM) 300 MG tablet, Take 300 mg by mouth daily. (Patient not taking: Reported on 04/04/2023), Disp: , Rfl:    amLODipine (NORVASC) 5 MG tablet, Take 5 mg by mouth daily., Disp: , Rfl:    aspirin EC 81 MG tablet, Take 81 mg by mouth daily., Disp: , Rfl:    atorvastatin (LIPITOR) 80 MG tablet, Take 1 tablet (80 mg total) by mouth daily at 6 PM., Disp: 90 tablet, Rfl: 3   azithromycin (ZITHROMAX) 250 MG tablet, Take by mouth. (Patient not taking: Reported on 04/04/2023), Disp: , Rfl:    buPROPion (ZYBAN) 150 MG 12 hr tablet, Take 150 mg by mouth every other day. (Patient not taking: Reported on 04/04/2023), Disp: , Rfl:    clopidogrel (PLAVIX) 75 MG tablet, Take 1 tablet (75 mg total) by mouth daily with breakfast., Disp: 90 tablet, Rfl: 3   diclofenac Sodium (VOLTAREN) 1 % GEL, Apply 2 g topically 4 (four) times daily as needed (pain). (Patient not taking: Reported on 04/04/2023), Disp: , Rfl:    docusate sodium (COLACE) 100 MG capsule, Take 200 mg by mouth daily., Disp: ,  Rfl:    finasteride (PROSCAR) 5 MG tablet, Take 5 mg by mouth daily., Disp: , Rfl:    fluticasone (FLONASE) 50 MCG/ACT nasal spray, Place 2 sprays into both nostrils daily., Disp: , Rfl:    ipratropium (ATROVENT) 0.03 % nasal spray, Place into the nose. (Patient not taking: Reported on 04/04/2023), Disp: , Rfl:    latanoprost (XALATAN) 0.005 % ophthalmic solution, Place 1 drop into both eyes at bedtime. (Patient not taking: Reported on 04/04/2023), Disp: , Rfl:    Mometasone Furoate 200 MCG/ACT AERO, Take by mouth., Disp: , Rfl:    montelukast (SINGULAIR) 10 MG tablet, Take 1 tablet by mouth every evening., Disp: , Rfl:    nicotine polacrilex (COMMIT) 2 MG lozenge, Take by mouth. (Patient not taking: Reported on 04/04/2023), Disp: , Rfl:    omeprazole (PRILOSEC OTC) 20 MG tablet, Take 1 tablet (20 mg total) by mouth daily., Disp: 30 tablet, Rfl: 2   predniSONE (DELTASONE) 20 MG tablet, Take by mouth. (Patient not taking: Reported on 04/04/2023), Disp: , Rfl:    sulfamethoxazole-trimethoprim (BACTRIM DS) 800-160 MG tablet, Take 1 tablet by mouth 3 (three) times a week. (Patient not taking: Reported on 04/04/2023), Disp: 12 tablet, Rfl: 0   tamsulosin (FLOMAX) 0.4 MG CAPS capsule, Take 2 capsules by mouth daily., Disp: , Rfl:    Tiotropium Bromide-Olodaterol 2.5-2.5 MCG/ACT AERS, Take by mouth., Disp: , Rfl:   Past Medical History:  Past Medical History:  Diagnosis Date   Arthritis    Glaucoma    Hyperlipidemia    Hypertension    Peripheral vascular disease (HCC)     Tobacco Use: Social History   Tobacco Use  Smoking Status Every Day   Current packs/day: 0.00   Average packs/day: 0.3 packs/day for 40.0 years (10.0 ttl pk-yrs)   Types: Cigarettes   Start date: 12/04/1977   Last attempt to quit: 12/04/2017   Years since quitting: 5.3  Smokeless Tobacco Never  Tobacco Comments   Smokes 3-4 cigs/ day    Labs: Review Flowsheet       Latest Ref Rng & Units 12/29/2017 02/03/2022  Labs  for ITP Cardiac and Pulmonary Rehab  Cholestrol 100 - 199 mg/dL 063  -  LDL (calc) 0 - 99 mg/dL 18  -  HDL-C >01 mg/dL 46  -  Trlycerides <601 mg/dL 093  88     Capillary Blood Glucose: No results found for: "GLUCAP"   Pulmonary Assessment Scores:  Pulmonary Assessment Scores     Row Name 04/04/23 0922         ADL UCSD   ADL Phase Entry     SOB Score total 61       CAT Score   CAT Score 18       mMRC Score   mMRC Score 4             UCSD: Self-administered rating of dyspnea associated with activities of daily living (ADLs) 6-point scale (0 = "not at all" to 5 = "maximal or unable to do because of breathlessness")  Scoring Scores range from 0 to 120.  Minimally important difference is 5 units  CAT: CAT can identify the health impairment of COPD patients and is better correlated with disease progression.  CAT has a scoring range of zero to 40. The CAT score is classified into four groups of low (less than 10), medium (10 - 20), high (21-30) and very high (31-40) based on the impact level of disease on health status. A CAT score over 10 suggests significant symptoms.  A worsening CAT score could be explained by an exacerbation, poor medication adherence, poor inhaler technique, or progression of COPD or comorbid conditions.  CAT MCID is 2 points  mMRC: mMRC (Modified Medical Research Council) Dyspnea Scale is used to assess the degree of baseline functional disability in patients of respiratory disease due to dyspnea. No minimal important difference is established. A decrease in score of 1 point or greater is considered a positive change.   Pulmonary Function Assessment:  Pulmonary Function Assessment - 04/04/23 0922       Breath   Shortness of Breath Yes;Limiting activity             Exercise Target Goals: Exercise Program Goal: Individual exercise prescription set using results from initial 6 min walk test and THRR while considering  patient's activity  barriers and safety.   Exercise Prescription Goal: Initial exercise prescription builds to 30-45 minutes a day of aerobic activity, 2-3 days per week.  Home exercise guidelines will be given to patient during program as part of exercise prescription that the participant will acknowledge.  Activity Barriers & Risk Stratification:  Activity Barriers & Cardiac Risk Stratification - 04/04/23 0925       Activity Barriers & Cardiac Risk Stratification   Activity Barriers Deconditioning;Muscular Weakness;Shortness of Breath;History of Falls;Balance Concerns             6  Minute Walk:  6 Minute Walk     Row Name 04/04/23 1011         6 Minute Walk   Phase Initial     Distance 960 feet     Walk Time 6 minutes     # of Rest Breaks 0     MPH 1.82     METS 2.54     RPE 11     Perceived Dyspnea  1     VO2 Peak 8.9     Symptoms No     Resting HR 100 bpm     Resting BP 104/66     Resting Oxygen Saturation  100 %     Exercise Oxygen Saturation  during 6 min walk 97 %     Max Ex. HR 106 bpm     Max Ex. BP 102/62     2 Minute Post BP 110/64       Interval HR   1 Minute HR 100     2 Minute HR 101     3 Minute HR 106     4 Minute HR 100     5 Minute HR 98     6 Minute HR 98     2 Minute Post HR 95     Interval Heart Rate? Yes       Interval Oxygen   Interval Oxygen? Yes     Baseline Oxygen Saturation % 100 %     1 Minute Oxygen Saturation % 98 %     1 Minute Liters of Oxygen 0 L     2 Minute Oxygen Saturation % 97 %     2 Minute Liters of Oxygen 0 L     3 Minute Oxygen Saturation % 97 %     3 Minute Liters of Oxygen 0 L     4 Minute Oxygen Saturation % 97 %     4 Minute Liters of Oxygen 0 L     5 Minute Oxygen Saturation % 97 %     5 Minute Liters of Oxygen 0 L     6 Minute Oxygen Saturation % 97 %     6 Minute Liters of Oxygen 0 L     2 Minute Post Oxygen Saturation % 99 %     2 Minute Post Liters of Oxygen 0 L              Oxygen Initial Assessment:   Oxygen Initial Assessment - 04/04/23 0921       Home Oxygen   Home Oxygen Device None    Sleep Oxygen Prescription None    Home Exercise Oxygen Prescription None    Home Resting Oxygen Prescription None      Initial 6 min Walk   Oxygen Used None      Program Oxygen Prescription   Program Oxygen Prescription None      Intervention   Short Term Goals To learn and understand importance of maintaining oxygen saturations>88%;To learn and demonstrate proper use of respiratory medications;To learn and understand importance of monitoring SPO2 with pulse oximeter and demonstrate accurate use of the pulse oximeter.;To learn and demonstrate proper pursed lip breathing techniques or other breathing techniques.     Long  Term Goals Verbalizes importance of monitoring SPO2 with pulse oximeter and return demonstration;Maintenance of O2 saturations>88%;Exhibits proper breathing techniques, such as pursed lip breathing or other method taught during program session;Compliance with respiratory medication;Demonstrates proper use of MDI's  Oxygen Re-Evaluation:   Oxygen Discharge (Final Oxygen Re-Evaluation):   Initial Exercise Prescription:  Initial Exercise Prescription - 04/04/23 1000       Date of Initial Exercise RX and Referring Provider   Date 04/04/23    Referring Provider Vivia Budge   Expected Discharge Date 07/01/23      Recumbant Bike   Level 1    RPM 63    Watts 38    Minutes 15    METs 2      NuStep   Level 1    SPM 79    Minutes 15    METs 2.1      Prescription Details   Frequency (times per week) 2    Duration Progress to 30 minutes of continuous aerobic without signs/symptoms of physical distress      Intensity   THRR 40-80% of Max Heartrate 59-118    Ratings of Perceived Exertion 11-13    Perceived Dyspnea 0-4      Progression   Progression Continue to progress workloads to maintain intensity without signs/symptoms of physical distress.       Resistance Training   Training Prescription Yes    Weight blue bands    Reps 10-15             Perform Capillary Blood Glucose checks as needed.  Exercise Prescription Changes:   Exercise Comments:   Exercise Goals and Review:   Exercise Goals     Row Name 04/04/23 4034             Exercise Goals   Increase Physical Activity Yes       Intervention Provide advice, education, support and counseling about physical activity/exercise needs.;Develop an individualized exercise prescription for aerobic and resistive training based on initial evaluation findings, risk stratification, comorbidities and participant's personal goals.       Expected Outcomes Short Term: Attend rehab on a regular basis to increase amount of physical activity.;Long Term: Exercising regularly at least 3-5 days a week.;Long Term: Add in home exercise to make exercise part of routine and to increase amount of physical activity.       Increase Strength and Stamina Yes       Intervention Provide advice, education, support and counseling about physical activity/exercise needs.;Develop an individualized exercise prescription for aerobic and resistive training based on initial evaluation findings, risk stratification, comorbidities and participant's personal goals.       Expected Outcomes Short Term: Increase workloads from initial exercise prescription for resistance, speed, and METs.;Short Term: Perform resistance training exercises routinely during rehab and add in resistance training at home;Long Term: Improve cardiorespiratory fitness, muscular endurance and strength as measured by increased METs and functional capacity ( )       Able to understand and use rate of perceived exertion (RPE) scale Yes       Intervention Provide education and explanation on how to use RPE scale       Expected Outcomes Short Term: Able to use RPE daily in rehab to express subjective intensity level;Long Term:  Able to use RPE to  guide intensity level when exercising independently       Able to understand and use Dyspnea scale Yes       Intervention Provide education and explanation on how to use Dyspnea scale       Expected Outcomes Short Term: Able to use Dyspnea scale daily in rehab to express subjective sense of shortness of breath during exertion;Long Term: Able to  use Dyspnea scale to guide intensity level when exercising independently       Knowledge and understanding of Target Heart Rate Range (THRR) Yes       Intervention Provide education and explanation of THRR including how the numbers were predicted and where they are located for reference       Expected Outcomes Short Term: Able to state/look up THRR;Long Term: Able to use THRR to govern intensity when exercising independently;Short Term: Able to use daily as guideline for intensity in rehab       Understanding of Exercise Prescription Yes       Intervention Provide education, explanation, and written materials on patient's individual exercise prescription       Expected Outcomes Short Term: Able to explain program exercise prescription;Long Term: Able to explain home exercise prescription to exercise independently                Exercise Goals Re-Evaluation :  Exercise Goals Re-Evaluation     Row Name 04/04/23 0923             Exercise Goal Re-Evaluation   Exercise Goals Review Increase Physical Activity;Able to understand and use Dyspnea scale;Understanding of Exercise Prescription;Increase Strength and Stamina;Knowledge and understanding of Target Heart Rate Range (THRR);Able to understand and use rate of perceived exertion (RPE) scale       Comments Nathan Palmer is scheduled to begin exercise on 12/24.       Expected Outcomes Through exercise in rehab and at home, the patient will decrease shortness of breath with daily activities and feel confident in doing an exercise regimen at home.                Discharge Exercise Prescription (Final  Exercise Prescription Changes):   Nutrition:  Target Goals: Understanding of nutrition guidelines, daily intake of sodium 1500mg , cholesterol 200mg , calories 30% from fat and 7% or less from saturated fats, daily to have 5 or more servings of fruits and vegetables.  Biometrics:   Renee Ramus - 04/04/23 0909        Post  Biometrics   Grip Strength 22 kg             Nutrition Therapy Plan and Nutrition Goals:   Nutrition Assessments:  MEDIFICTS Score Key: >=70 Need to make dietary changes  40-70 Heart Healthy Diet <= 40 Therapeutic Level Cholesterol Diet   Picture Your Plate Scores: <06 Unhealthy dietary pattern with much room for improvement. 41-50 Dietary pattern unlikely to meet recommendations for good health and room for improvement. 51-60 More healthful dietary pattern, with some room for improvement.  >60 Healthy dietary pattern, although there may be some specific behaviors that could be improved.    Nutrition Goals Re-Evaluation:   Nutrition Goals Discharge (Final Nutrition Goals Re-Evaluation):   Psychosocial: Target Goals: Acknowledge presence or absence of significant depression and/or stress, maximize coping skills, provide positive support system. Participant is able to verbalize types and ability to use techniques and skills needed for reducing stress and depression.  Initial Review & Psychosocial Screening:  Initial Psych Review & Screening - 04/04/23 0919       Initial Review   Current issues with None Identified      Family Dynamics   Good Support System? Yes    Comments Children      Barriers   Psychosocial barriers to participate in program There are no identifiable barriers or psychosocial needs.      Screening Interventions   Interventions Encouraged to  exercise             Quality of Life Scores:  Scores of 19 and below usually indicate a poorer quality of life in these areas.  A difference of  2-3 points is a  clinically meaningful difference.  A difference of 2-3 points in the total score of the Quality of Life Index has been associated with significant improvement in overall quality of life, self-image, physical symptoms, and general health in studies assessing change in quality of life.  PHQ-9: Review Flowsheet       04/04/2023  Depression screen PHQ 2/9  Decreased Interest 0  Down, Depressed, Hopeless 0  PHQ - 2 Score 0  Altered sleeping 0  Tired, decreased energy 0  Change in appetite 0  Feeling bad or failure about yourself  0  Trouble concentrating 0  Moving slowly or fidgety/restless 0  Suicidal thoughts 0  PHQ-9 Score 0   Interpretation of Total Score  Total Score Depression Severity:  1-4 = Minimal depression, 5-9 = Mild depression, 10-14 = Moderate depression, 15-19 = Moderately severe depression, 20-27 = Severe depression   Psychosocial Evaluation and Intervention:  Psychosocial Evaluation - 04/04/23 0919       Psychosocial Evaluation & Interventions   Interventions Encouraged to exercise with the program and follow exercise prescription    Comments Pt denies any psychosocial barrieres at this time. Will continue to monitor.    Expected Outcomes For Nathan Palmer to participate in rehab free of psychosocial concerns.    Continue Psychosocial Services  No Follow up required             Psychosocial Re-Evaluation:  Psychosocial Re-Evaluation     Row Name 04/07/23 [id number]             Psychosocial Re-Evaluation   Current issues with None Identified       Comments No changes since orientation. Nathan Palmer is scheduled to start the program on 04/08/23       Expected Outcomes For Nathan Palmer to participate in rehab free of psychosocial concerns.       Interventions Encouraged to attend Pulmonary Rehabilitation for the exercise       Continue Psychosocial Services  No Follow up required                Psychosocial Discharge (Final Psychosocial Re-Evaluation):  Psychosocial  Re-Evaluation - 04/07/23 0946       Psychosocial Re-Evaluation   Current issues with None Identified    Comments No changes since orientation. Nathan Palmer is scheduled to start the program on 04/08/23    Expected Outcomes For Nathan Palmer to participate in rehab free of psychosocial concerns.    Interventions Encouraged to attend Pulmonary Rehabilitation for the exercise    Continue Psychosocial Services  No Follow up required             Education: Education Goals: Education classes will be provided on a weekly basis, covering required topics. Participant will state understanding/return demonstration of topics presented.  Learning Barriers/Preferences:  Learning Barriers/Preferences - 04/04/23 9604       Learning Barriers/Preferences   Learning Barriers Sight    Learning Preferences None             Education Topics: Know Your Numbers Group instruction that is supported by a PowerPoint presentation. Instructor discusses importance of knowing and understanding resting, exercise, and post-exercise oxygen saturation, heart rate, and blood pressure. Oxygen saturation, heart rate, blood pressure, rating of perceived exertion, and dyspnea are reviewed  along with a normal range for these values.    Exercise for the Pulmonary Patient Group instruction that is supported by a PowerPoint presentation. Instructor discusses benefits of exercise, core components of exercise, frequency, duration, and intensity of an exercise routine, importance of utilizing pulse oximetry during exercise, safety while exercising, and options of places to exercise outside of rehab.    MET Level  Group instruction provided by PowerPoint, verbal discussion, and written material to support subject matter. Instructor reviews what METs are and how to increase METs.    Pulmonary Medications Verbally interactive group education provided by instructor with focus on inhaled medications and proper  administration.   Anatomy and Physiology of the Respiratory System Group instruction provided by PowerPoint, verbal discussion, and written material to support subject matter. Instructor reviews respiratory cycle and anatomical components of the respiratory system and their functions. Instructor also reviews differences in obstructive and restrictive respiratory diseases with examples of each.    Oxygen Safety Group instruction provided by PowerPoint, verbal discussion, and written material to support subject matter. There is an overview of "What is Oxygen" and "Why do we need it".  Instructor also reviews how to create a safe environment for oxygen use, the importance of using oxygen as prescribed, and the risks of noncompliance. There is a brief discussion on traveling with oxygen and resources the patient may utilize.   Oxygen Use Group instruction provided by PowerPoint, verbal discussion, and written material to discuss how supplemental oxygen is prescribed and different types of oxygen supply systems. Resources for more information are provided.    Breathing Techniques Group instruction that is supported by demonstration and informational handouts. Instructor discusses the benefits of pursed lip and diaphragmatic breathing and detailed demonstration on how to perform both.     Risk Factor Reduction Group instruction that is supported by a PowerPoint presentation. Instructor discusses the definition of a risk factor, different risk factors for pulmonary disease, and how the heart and lungs work together.   Pulmonary Diseases Group instruction provided by PowerPoint, verbal discussion, and written material to support subject matter. Instructor gives an overview of the different type of pulmonary diseases. There is also a discussion on risk factors and symptoms as well as ways to manage the diseases.   Stress and Energy Conservation Group instruction provided by PowerPoint, verbal  discussion, and written material to support subject matter. Instructor gives an overview of stress and the impact it can have on the body. Instructor also reviews ways to reduce stress. There is also a discussion on energy conservation and ways to conserve energy throughout the day.   Warning Signs and Symptoms Group instruction provided by PowerPoint, verbal discussion, and written material to support subject matter. Instructor reviews warning signs and symptoms of stroke, heart attack, cold and flu. Instructor also reviews ways to prevent the spread of infection.   Other Education Group or individual verbal, written, or video instructions that support the educational goals of the pulmonary rehab program.    Knowledge Questionnaire Score:  Knowledge Questionnaire Score - 04/04/23 0940       Knowledge Questionnaire Score   Pre Score 12/18             Core Components/Risk Factors/Patient Goals at Admission:  Personal Goals and Risk Factors at Admission - 04/04/23 0921       Core Components/Risk Factors/Patient Goals on Admission   Tobacco Cessation Yes    Number of packs per day 2-3 cigs/day    Intervention Assist  the participant in steps to quit. Provide individualized education and counseling about committing to Tobacco Cessation, relapse prevention, and pharmacological support that can be provided by physician.;Education officer, environmental, assist with locating and accessing local/national Quit Smoking programs, and support quit date choice.    Expected Outcomes Short Term: Will demonstrate readiness to quit, by selecting a quit date.;Short Term: Will quit all tobacco product use, adhering to prevention of relapse plan.;Long Term: Complete abstinence from all tobacco products for at least 12 months from quit date.    Improve shortness of breath with ADL's Yes    Intervention Provide education, individualized exercise plan and daily activity instruction to help decrease symptoms  of SOB with activities of daily living.    Expected Outcomes Short Term: Improve cardiorespiratory fitness to achieve a reduction of symptoms when performing ADLs;Long Term: Be able to perform more ADLs without symptoms or delay the onset of symptoms             Core Components/Risk Factors/Patient Goals Review:   Goals and Risk Factor Review     Row Name 04/07/23 0947             Core Components/Risk Factors/Patient Goals Review   Personal Goals Review Improve shortness of breath with ADL's;Develop more efficient breathing techniques such as purse lipped breathing and diaphragmatic breathing and practicing self-pacing with activity.;Tobacco Cessation       Review Unable to assess goals. Nathan Palmer is scheduled to start the program on 04/08/23       Expected Outcomes For Nathan Palmer to develop more efficient breathing techniques such as purse lipped breathing and diaphragmatic breathing, and self pacing with activity. For Nathan Palmer to improve his shortness of breath with ADLs and to stop smoking                Core Components/Risk Factors/Patient Goals at Discharge (Final Review):   Goals and Risk Factor Review - 04/07/23 0947       Core Components/Risk Factors/Patient Goals Review   Personal Goals Review Improve shortness of breath with ADL's;Develop more efficient breathing techniques such as purse lipped breathing and diaphragmatic breathing and practicing self-pacing with activity.;Tobacco Cessation    Review Unable to assess goals. Nathan Palmer is scheduled to start the program on 04/08/23    Expected Outcomes For Nathan Palmer to develop more efficient breathing techniques such as purse lipped breathing and diaphragmatic breathing, and self pacing with activity. For Nathan Palmer to improve his shortness of breath with ADLs and to stop smoking             ITP Comments: Pt is making expected progress toward Pulmonary Rehab goals after completing 1 session(s). Recommend continued exercise, life style  modification, education, and utilization of breathing techniques to increase stamina and strength, while also decreasing shortness of breath with exertion.    Comments: Dr. Mechele Collin is Medical Director for Pulmonary Rehab at [facility].

## 2023-04-08 NOTE — Progress Notes (Signed)
Daily Session Note  Patient Details  Name: Nathan AYLES Sr. MRN: [id number] Date of Birth: 01-Mar-1951 Referring Provider:   Doristine Devoid Pulmonary Rehab Walk Test from 04/04/2023 in [facility] for Heart, Vascular, & Lung Health  Referring Provider [name]       Encounter Date: 04/08/2023  Check In:  Session Check In - 04/08/23 1110       Check-In   Supervising physician immediately available to respond to emergencies [facility] MD immediately available    Physician(s) Edd Fabian, NP    Location [facility]    Staff Present Raford Pitcher, MS, ACSM-CEP, Exercise Physiologist;Mary Gerre Scull, RN, BSN;Preethi Scantlebury Idelle Crouch BS, ACSM-CEP, Exercise Physiologist;Olinty Peggye Pitt, MS, ACSM-CEP, Exercise Physiologist    Virtual Visit No    Medication changes reported     No    Fall or balance concerns reported    No    Tobacco Cessation No Change    Warm-up and Cool-down Not performed (comment)    Resistance Training Performed No    VAD Patient? No    PAD/SET Patient? No      Pain Assessment   Currently in Pain? No/denies    Multiple Pain Sites No             Capillary Blood Glucose: No results found for this or any previous visit (from the past 24 hours).   Exercise Prescription Changes - 04/08/23 1200       Response to Exercise   Blood Pressure (Admit) 98/58    Blood Pressure (Exercise) 100/62    Blood Pressure (Exit) 104/60    Heart Rate (Admit) 95 bpm    Heart Rate (Exercise) 106 bpm    Heart Rate (Exit) 111 bpm    Oxygen Saturation (Admit) 98 %    Oxygen Saturation (Exercise) 98 %    Oxygen Saturation (Exit) 98 %    Rating of Perceived Exertion (Exercise) 11    Perceived Dyspnea (Exercise) 1    Duration Progress to 30 minutes of  aerobic without signs/symptoms of physical distress    Intensity THRR unchanged      Progression   Progression Continue to progress workloads to maintain intensity without signs/symptoms of  physical distress.      Resistance Training   Training Prescription Yes    Weight blue bands    Reps 10-15    Time 10 Minutes      Recumbant Bike   Level 1    RPM 46    Watts 12    Minutes 15    METs 1.9      NuStep   Level 1    SPM 80    Minutes 15    METs 2             Social History   Tobacco Use  Smoking Status Every Day   Current packs/day: 0.00   Average packs/day: 0.3 packs/day for 40.0 years (10.0 ttl pk-yrs)   Types: Cigarettes   Start date: 12/04/1977   Last attempt to quit: 12/04/2017   Years since quitting: 5.3  Smokeless Tobacco Never  Tobacco Comments   Smokes 3-4 cigs/ day    Goals Met:  Exercise tolerated well No report of concerns or symptoms today Strength training completed today  Goals Unmet:  Not Applicable  Comments: Service time is from 1007 to 1140.    Dr. Mechele Collin is Medical Director for Pulmonary Rehab at [facility].

## 2023-04-10 ENCOUNTER — Encounter (HOSPITAL_COMMUNITY)
Admission: RE | Admit: 2023-04-10 | Discharge: 2023-04-10 | Disposition: A | Discharge status: Home / Self Care | Payer: No Typology Code available for payment source | Source: Ambulatory Visit | Attending: Pulmonary Disease | Admitting: Pulmonary Disease

## 2023-04-10 DIAGNOSIS — J449 Chronic obstructive pulmonary disease, unspecified: Secondary | ICD-10-CM | POA: Diagnosis not present

## 2023-04-10 NOTE — Progress Notes (Signed)
Daily Session Note  Patient Details  Name: Nathan Palmer. MRN: [id number] Date of Birth: 08-16-1950 Referring Provider:   Doristine Devoid Pulmonary Rehab Walk Test from 04/04/2023 in [facility] for Heart, Vascular, & Lung Health  Referring Provider [name]       Encounter Date: 04/10/2023  Check In:  Session Check In - 04/10/23 1208       Check-In   Supervising physician immediately available to respond to emergencies [facility] immediately available    Physician(s) Reather Littler, NP    [facility]    Staff Present Essie Hart, RN, BSN;Randi Idelle Crouch BS, ACSM-CEP, Exercise Physiologist;Maria Whitaker, RN, Fuller Plan, RT    Virtual Visit No    Medication changes reported     No    Fall or balance concerns reported    No    Tobacco Cessation No Change    Warm-up and Cool-down Performed as group-led instruction    Resistance Training Performed Yes    VAD Patient? No    PAD/SET Patient? No      Pain Assessment   Currently in Pain? No/denies    Multiple Pain Sites No             Capillary Blood Glucose: No results found for this or any previous visit (from the past 24 hours).    Social History   Tobacco Use  Smoking Status Every Day   Current packs/day: 0.00   Average packs/day: 0.3 packs/day for 40.0 years (10.0 ttl pk-yrs)   Types: Cigarettes   Start date: 12/04/1977   Last attempt to quit: 12/04/2017   Years since quitting: 5.3  Smokeless Tobacco Never  Tobacco Comments   Smokes 3-4 cigs/ day    Goals Met:  Proper associated with RPD/PD & O2 Sat Independence with exercise equipment Exercise tolerated well No report of concerns or symptoms today Strength training completed today  Goals Unmet:  Not Applicable  Comments: Service time is from 1006 to 1148.    Dr. Mechele Collin is Medical Director for Pulmonary Rehab at [facility].

## 2023-04-15 ENCOUNTER — Encounter (HOSPITAL_COMMUNITY)
Admission: RE | Admit: 2023-04-15 | Discharge: 2023-04-15 | Disposition: A | Discharge status: Home / Self Care | Payer: No Typology Code available for payment source | Source: Ambulatory Visit | Attending: Pulmonary Disease | Admitting: Pulmonary Disease

## 2023-04-15 DIAGNOSIS — J449 Chronic obstructive pulmonary disease, unspecified: Secondary | ICD-10-CM

## 2023-04-15 NOTE — Progress Notes (Signed)
 Nathan Palmer is currently smoking a total of 6 cigarettes a day. He is struggling to quit however he wants to quit. Discussed tips and methods. He is contemplative on a quit date. Gave resources for him to review. Will f/u on pts readiness and encourage a plan. Aliene Aris BS, ACSM-CEP 04/15/2023 3:23 PM

## 2023-04-15 NOTE — Progress Notes (Signed)
 Daily Session Note  Patient Details  Name: Nathan BOFFA Sr. MRN: [id number] Date of Birth: [birth year] Referring Provider:   Conrad Ports Pulmonary Rehab Walk Test from 04/04/2023 in [facility] for Heart, Vascular, & Lung Health  Referring Provider [name]       Encounter Date: 04/15/2023  Check In:  Session Check In - 04/15/23 1320       Check-In   Supervising physician immediately available to respond to emergencies [facility] immediately available    Physician(s) Orren Fabry, PA    [facility]    Staff Present Ronal Levin, RN, BSN;Randi Midge BS, ACSM-CEP, Exercise Physiologist;Casey Claudene, RT    Virtual Visit No    Medication changes reported     No    Fall or balance concerns reported    No    Tobacco Cessation No Change    Warm-up and Cool-down Performed as group-led instruction    Resistance Training Performed Yes    VAD Patient? No    PAD/SET Patient? No      Pain Assessment   Currently in Pain? No/denies    Multiple Pain Sites No             Capillary Blood Glucose: No results found for this or any previous visit (from the past 24 hours).    Social History   Tobacco Use  Smoking Status Every Day   Current packs/day: 0.00   Average packs/day: 0.3 packs/day for 40.0 years (10.0 ttl pk-yrs)   Types: Cigarettes   Start date: 12/04/1977   Last attempt to quit: 12/04/2017   Years since quitting: 5.3  Smokeless Tobacco Never  Tobacco Comments   Smokes 3-4 cigs/ day    Goals Met:  Exercise tolerated well No report of concerns or symptoms today Strength training completed today  Goals Unmet:  Not Applicable  Comments: Service time is from 1300 to 1432    Dr. Slater Staff is Medical Director for Pulmonary Rehab at [facility].

## 2023-04-17 ENCOUNTER — Encounter (HOSPITAL_COMMUNITY)
Admission: RE | Admit: 2023-04-17 | Discharge: 2023-04-17 | Disposition: A | Discharge status: Home / Self Care | Payer: No Typology Code available for payment source | Source: Ambulatory Visit | Attending: Pulmonary Disease | Admitting: Pulmonary Disease

## 2023-04-17 VITALS — Wt 131.4 lb

## 2023-04-17 DIAGNOSIS — Z87891 Personal history of nicotine dependence: Secondary | ICD-10-CM | POA: Diagnosis not present

## 2023-04-17 DIAGNOSIS — Z5189 Encounter for other specified aftercare: Secondary | ICD-10-CM | POA: Insufficient documentation

## 2023-04-17 DIAGNOSIS — J449 Chronic obstructive pulmonary disease, unspecified: Secondary | ICD-10-CM | POA: Diagnosis present

## 2023-04-17 NOTE — Progress Notes (Signed)
 Daily Session Note  Patient Details  Name: Nathan COTHAM Sr. MRN: [id number] Date of Birth: 02/09/1951 Referring Provider:   Conrad Ports Pulmonary Rehab Walk Test from 04/04/2023 in [facility] for Heart, Vascular, & Lung Health  Referring Provider [name]       Encounter Date: 04/17/2023  Check In:  Session Check In - 04/17/23 1348       Check-In   Supervising physician immediately available to respond to emergencies [facility] immediately available    Physician(s) Rosaline Skains, NP    [facility]    Staff Present Ronal Levin, RN, BSN;Randi Midge BS, ACSM-CEP, Exercise Physiologist;Corinthia Helmers Claudene, RT    Virtual Visit No    Medication changes reported     No    Fall or balance concerns reported    No    Tobacco Cessation No Change    Warm-up and Cool-down Performed as group-led instruction    Resistance Training Performed Yes    VAD Patient? No    PAD/SET Patient? No      Pain Assessment   Currently in Pain? No/denies    Multiple Pain Sites No             Capillary Blood Glucose: No results found for this or any previous visit (from the past 24 hours).    Social History   Tobacco Use  Smoking Status Every Day   Current packs/day: 0.00   Average packs/day: 0.3 packs/day for 40.0 years (10.0 ttl pk-yrs)   Types: Cigarettes   Start date: 12/04/1977   Last attempt to quit: 12/04/2017   Years since quitting: 5.3  Smokeless Tobacco Never  Tobacco Comments   Smokes 3-4 cigs/ day    Goals Met:  Proper associated with RPD/PD & O2 Sat Independence with exercise equipment Exercise tolerated well No report of concerns or symptoms today Strength training completed today  Goals Unmet:  Not Applicable  Comments: Service time is from 1305 to 1433.    Dr. Slater Staff is Medical Director for Pulmonary Rehab at [facility].

## 2023-04-22 ENCOUNTER — Encounter (HOSPITAL_COMMUNITY)
Admission: RE | Admit: 2023-04-22 | Discharge: 2023-04-22 | Disposition: A | Discharge status: Home / Self Care | Payer: No Typology Code available for payment source | Source: Ambulatory Visit | Attending: Pulmonary Disease | Admitting: Pulmonary Disease

## 2023-04-22 VITALS — Wt 131.2 lb

## 2023-04-22 DIAGNOSIS — Z5189 Encounter for other specified aftercare: Secondary | ICD-10-CM | POA: Diagnosis not present

## 2023-04-22 DIAGNOSIS — J449 Chronic obstructive pulmonary disease, unspecified: Secondary | ICD-10-CM

## 2023-04-22 NOTE — Progress Notes (Signed)
 Daily Session Note  Patient Details  Name: Nathan TROY Sr. MRN: [id number] Date of Birth: 18-Oct-1950 Referring Provider:   Conrad Ports Pulmonary Rehab Walk Test from 04/04/2023 in [facility] for Heart, Vascular, & Lung Health  Referring Provider [name]       Encounter Date: 04/22/2023  Check In:  Session Check In - 04/22/23 1319       Check-In   Supervising physician immediately available to respond to emergencies [facility] immediately available    Physician(s) Rosaline Skains, NP    [facility]    Staff Present Ronal Levin, RN, BSN;Randi Midge BS, ACSM-CEP, Exercise Physiologist;Karl Knarr Claudene Neita Moats, MS, ACSM-CEP, Exercise Physiologist    Virtual Visit No    Medication changes reported     No    Fall or balance concerns reported    No    Tobacco Cessation No Change    Warm-up and Cool-down Performed as group-led instruction    Resistance Training Performed Yes    VAD Patient? No    PAD/SET Patient? No      Pain Assessment   Currently in Pain? No/denies    Multiple Pain Sites No             Capillary Blood Glucose: No results found for this or any previous visit (from the past 24 hours).   Exercise Prescription Changes - 04/22/23 1500       Response to Exercise   Blood Pressure (Admit) 110/64    Blood Pressure (Exercise) 122/60    Blood Pressure (Exit) 98/60    Heart Rate (Admit) 95 bpm    Heart Rate (Exercise) 107 bpm    Heart Rate (Exit) 105 bpm    Oxygen Saturation (Admit) 99 %    Oxygen Saturation (Exercise) 98 %    Oxygen Saturation (Exit) 99 %    Rating of Perceived Exertion (Exercise) 11    Perceived Dyspnea (Exercise) 1    Duration Continue with 30 min of aerobic exercise without signs/symptoms of physical distress.    Intensity THRR unchanged      Progression   Progression Continue to progress workloads to maintain intensity without signs/symptoms of physical distress.       Resistance Training   Training Prescription Yes    Weight blue bands    Reps 10-15    Time 10 Minutes      Recumbant Bike   Level 3    Minutes 15    METs 2.8      NuStep   Level 3    SPM 82    Minutes 15    METs 2.4             Social History   Tobacco Use  Smoking Status Every Day   Current packs/day: 0.00   Average packs/day: 0.3 packs/day for 40.0 years (10.0 ttl pk-yrs)   Types: Cigarettes   Start date: 12/04/1977   Last attempt to quit: 12/04/2017   Years since quitting: 5.3  Smokeless Tobacco Never  Tobacco Comments   Smokes 3-4 cigs/ day    Goals Met:  Proper associated with RPD/PD & O2 Sat Independence with exercise equipment Exercise tolerated well No report of concerns or symptoms today Strength training completed today  Goals Unmet:  Not Applicable  Comments: Service time is from 1306 to 1434.    Dr. Slater Staff is Medical Director for Pulmonary Rehab at [facility].

## 2023-04-24 ENCOUNTER — Encounter (HOSPITAL_COMMUNITY): Payer: No Typology Code available for payment source

## 2023-04-29 ENCOUNTER — Encounter (HOSPITAL_COMMUNITY)
Admission: RE | Admit: 2023-04-29 | Discharge: 2023-04-29 | Disposition: A | Discharge status: Home / Self Care | Payer: No Typology Code available for payment source | Source: Ambulatory Visit | Attending: Pulmonary Disease | Admitting: Pulmonary Disease

## 2023-04-29 DIAGNOSIS — J449 Chronic obstructive pulmonary disease, unspecified: Secondary | ICD-10-CM

## 2023-04-29 DIAGNOSIS — Z5189 Encounter for other specified aftercare: Secondary | ICD-10-CM | POA: Diagnosis not present

## 2023-04-29 NOTE — Progress Notes (Signed)
 Daily Session Note  Patient Details  Name: DYSON SEVEY Sr. MRN: [id number] Date of Birth: 11-Nov-1950 Referring Provider:   Conrad Ports Pulmonary Rehab Walk Test from 04/04/2023 in [facility] for Heart, Vascular, & Lung Health  Referring Provider [name]       Encounter Date: 04/29/2023  Check In:  Session Check In - 04/29/23 1329       Check-In   Supervising physician immediately available to respond to emergencies [facility] MD immediately available    Physician(s) Lamarr Satterfield, NP    [facility]    Staff Present Ronal Levin, RN, BSN;Keniya Schlotterbeck Midge HECKLE, ACSM-CEP, Exercise Physiologist;Casey Claudene Neita Moats, MS, ACSM-CEP, Exercise Physiologist    Virtual Visit No    Medication changes reported     No    Fall or balance concerns reported    No    Tobacco Cessation No Change    Warm-up and Cool-down Performed as group-led instruction    Resistance Training Performed Yes    VAD Patient? No    PAD/SET Patient? No      Pain Assessment   Currently in Pain? No/denies    Multiple Pain Sites No             Capillary Blood Glucose: No results found for this or any previous visit (from the past 24 hours).    Social History   Tobacco Use  Smoking Status Every Day   Current packs/day: 0.00   Average packs/day: 0.3 packs/day for 40.0 years (10.0 ttl pk-yrs)   Types: Cigarettes   Start date: 12/04/1977   Last attempt to quit: 12/04/2017   Years since quitting: 5.4  Smokeless Tobacco Never  Tobacco Comments   Smokes 3-4 cigs/ day    Goals Met:  Independence with exercise equipment Exercise tolerated well No report of concerns or symptoms today Strength training completed today  Goals Unmet:  Not Applicable  Comments: Service time is from 1305 to 1431.    Dr. Slater Staff is Medical Director for Pulmonary Rehab at [facility].

## 2023-05-01 ENCOUNTER — Encounter (HOSPITAL_COMMUNITY)
Admission: RE | Admit: 2023-05-01 | Discharge: 2023-05-01 | Disposition: A | Discharge status: Home / Self Care | Payer: No Typology Code available for payment source | Source: Ambulatory Visit | Attending: Pulmonary Disease | Admitting: Pulmonary Disease

## 2023-05-01 VITALS — Wt 131.0 lb

## 2023-05-01 DIAGNOSIS — J449 Chronic obstructive pulmonary disease, unspecified: Secondary | ICD-10-CM

## 2023-05-01 DIAGNOSIS — Z5189 Encounter for other specified aftercare: Secondary | ICD-10-CM | POA: Diagnosis not present

## 2023-05-01 NOTE — Progress Notes (Signed)
Daily Session Note  Patient Details  Name: Nathan MAISTO Sr. MRN: [id number] Date of Birth: 02/20/1951 Referring Provider:   Doristine Devoid Pulmonary Rehab Walk Test from 04/04/2023 in [facility] for Heart, Vascular, & Lung Health  Referring Provider [name]       Encounter Date: 05/01/2023  Check In:  Session Check In - 05/01/23 1331       Check-In   Supervising physician immediately available to respond to emergencies [facility] MD immediately available    Physician(s) Joni Reining, NP    [facility]    Staff Present Essie Hart, RN, BSN;Randi Dionisio Paschal, ACSM-CEP, Exercise Physiologist;Casey Charlean Sanfilippo, MS, ACSM-CEP, Exercise Physiologist    Virtual Visit No    Medication changes reported     No    Fall or balance concerns reported    No    Tobacco Cessation No Change    Warm-up and Cool-down Performed as group-led instruction    Resistance Training Performed Yes    VAD Patient? No    PAD/SET Patient? No      Pain Assessment   Currently in Pain? No/denies    Multiple Pain Sites No             Capillary Blood Glucose: No results found for this or any previous visit (from the past 24 hours).    Social History   Tobacco Use  Smoking Status Every Day   Current packs/day: 0.00   Average packs/day: 0.3 packs/day for 40.0 years (10.0 ttl pk-yrs)   Types: Cigarettes   Start date: 12/04/1977   Last attempt to quit: 12/04/2017   Years since quitting: 5.4  Smokeless Tobacco Never  Tobacco Comments   Smokes 3-4 cigs/ day    Goals Met:  Exercise tolerated well No report of concerns or symptoms today Strength training completed today  Goals Unmet:  Not Applicable  Comments: Service time is from 1306 to 1434    Dr. Mechele Collin is Medical Director for Pulmonary Rehab at [facility].

## 2023-05-01 NOTE — Progress Notes (Signed)
Home Exercise Prescription I have reviewed a Home Exercise Prescription with Nathan PalmerMarland Kitchen He is currently active but not exercising. He has a nordictrack that he can do for 2-3 min. We discussed increasing time as able or walking in his house or store. Encouraged increasing time to 30 min 1-2 days a week. Pt does state that he feels he gets enough exercise with his activity. We discussed the difference. Gave pt information on home exercise programs. He is not interested in a facility. The patient stated that their goals were to strengthen his legs. We reviewed exercise guidelines, target heart rate during exercise, RPE Scale, weather conditions, endpoints for exercise, warmup and cool down. The patient is encouraged to come to me with any questions. I will continue to follow up with the patient to assist them with progression and safety.  Spent 15 min discussing home exercise plan and goals.  Arcelia Pals Dix, [place], ACSM-CEP 05/01/2023 3:19 PM

## 2023-05-06 ENCOUNTER — Encounter (HOSPITAL_COMMUNITY)
Admission: RE | Admit: 2023-05-06 | Discharge: 2023-05-06 | Disposition: A | Discharge status: Home / Self Care | Payer: No Typology Code available for payment source | Source: Ambulatory Visit | Attending: Pulmonary Disease | Admitting: Pulmonary Disease

## 2023-05-07 NOTE — Progress Notes (Addendum)
Pulmonary Individual Treatment Plan  Patient Details  Name: Nathan Palmer. MRN: [id number] Date of Birth: 1950-08-20 Referring Provider:   Doristine Devoid Pulmonary Rehab Walk Test from 04/04/2023 in [facility]  Referring Provider [name]       Initial Encounter Date:  Flowsheet Row Pulmonary Rehab Walk Test from 04/04/2023 in [facility]  Date 04/04/23       Visit Diagnosis: COPD  Patient's Home Medications on Admission:   Current Outpatient Medications:    acetaminophen (TYLENOL) 500 MG tablet, Take 500-1,000 mg by mouth 3 (three) times daily as needed for mild pain, headache or moderate pain., Disp: , Rfl:    albuterol (VENTOLIN HFA) 108 (90 Base) MCG/ACT inhaler, Inhale into the lungs., Disp: , Rfl:    allopurinol (ZYLOPRIM) 300 MG tablet, Take 300 mg by mouth daily. (Patient not taking: Reported on 04/04/2023), Disp: , Rfl:    amLODipine (NORVASC) 5 MG tablet, Take 5 mg by mouth daily., Disp: , Rfl:    aspirin EC 81 MG tablet, Take 81 mg by mouth daily., Disp: , Rfl:    atorvastatin (LIPITOR) 80 MG tablet, Take 1 tablet (80 mg total) by mouth daily at 6 PM., Disp: 90 tablet, Rfl: 3   azithromycin (ZITHROMAX) 250 MG tablet, Take by mouth. (Patient not taking: Reported on 04/04/2023), Disp: , Rfl:    buPROPion (ZYBAN) 150 MG 12 hr tablet, Take 150 mg by mouth every other day. (Patient not taking: Reported on 04/04/2023), Disp: , Rfl:    clopidogrel (PLAVIX) 75 MG tablet, Take 1 tablet (75 mg total) by mouth daily with breakfast., Disp: 90 tablet, Rfl: 3   diclofenac Sodium (VOLTAREN) 1 % GEL, Apply 2 g topically 4 (four) times daily as needed (pain). (Patient not taking: Reported on 04/04/2023), Disp: , Rfl:    docusate sodium (COLACE) 100 MG capsule, Take 200 mg by mouth daily., Disp: , Rfl:    finasteride (PROSCAR) 5 MG tablet, Take 5 mg by mouth  daily., Disp: , Rfl:    fluticasone (FLONASE) 50 MCG/ACT nasal spray, Place 2 sprays into both nostrils daily., Disp: , Rfl:    ipratropium (ATROVENT) 0.03 % nasal spray, Place into the nose. (Patient not taking: Reported on 04/04/2023), Disp: , Rfl:    latanoprost (XALATAN) 0.005 % ophthalmic solution, Place 1 drop into both eyes at bedtime. (Patient not taking: Reported on 04/04/2023), Disp: , Rfl:    Mometasone Furoate 200 MCG/ACT AERO, Take by mouth., Disp: , Rfl:    montelukast (SINGULAIR) 10 MG tablet, Take 1 tablet by mouth every evening., Disp: , Rfl:    nicotine polacrilex (COMMIT) 2 MG lozenge, Take by mouth. (Patient not taking: Reported on 04/04/2023), Disp: , Rfl:    omeprazole (PRILOSEC OTC) 20 MG tablet, Take 1 tablet (20 mg total) by mouth daily., Disp: 30 tablet, Rfl: 2   predniSONE (DELTASONE) 20 MG tablet, Take by mouth. (Patient not taking: Reported on 04/04/2023), Disp: , Rfl:    sulfamethoxazole-trimethoprim (BACTRIM DS) 800-160 MG tablet, Take 1 tablet by mouth 3 (three) times a week. (Patient not taking: Reported on 04/04/2023), Disp: 12 tablet, Rfl: 0   tamsulosin (FLOMAX) 0.4 MG CAPS capsule, Take 2 capsules by mouth daily., Disp: , Rfl:    Tiotropium Bromide-Olodaterol 2.5-2.5 MCG/ACT AERS, Take by mouth., Disp: , Rfl:   Past Medical History: Past Medical History:  Diagnosis Date  Arthritis    Glaucoma    Hyperlipidemia    Hypertension    Peripheral vascular disease (HCC)     Tobacco Use: Social History   Tobacco Use  Smoking Status Every Day   Current packs/day: 0.00   Average packs/day: 0.3 packs/day for 40.0 years (10.0 ttl pk-yrs)   Types: Cigarettes   Start date: 12/04/1977   Last attempt to quit: 12/04/2017   Years since quitting: 5.4  Smokeless Tobacco Never  Tobacco Comments   Smokes 3-4 cigs/ day    Labs: Review Flowsheet       Latest Ref Rng & Units 12/29/2017 02/03/2022  Labs for ITP Cardiac and Pulmonary Rehab  Cholestrol 100 - 199  mg/dL 244  -  LDL (calc) 0 - 99 mg/dL 18  -  HDL-C >01 mg/dL 46  -  Trlycerides <027 mg/dL 253  88     Capillary Blood Glucose: No results found for: "GLUCAP"   Pulmonary Assessment Scores:  Pulmonary Assessment Scores     Row Name 04/04/23 0922         ADL UCSD   ADL Phase Entry     SOB Score total 61       CAT Score   CAT Score 18       mMRC Score   mMRC Score 4             UCSD: Self-administered rating of dyspnea associated with activities of daily living (ADLs) 6-point scale (0 = "not at all" to 5 = "maximal or unable to do because of breathlessness")  Scoring Scores range from 0 to 120.  Minimally important difference is 5 units  CAT: CAT can identify the health impairment of COPD patients and is better correlated with disease progression.  CAT has a scoring range of zero to 40. The CAT score is classified into four groups of low (less than 10), medium (10 - 20), high (21-30) and very high (31-40) based on the impact level of disease on health status. A CAT score over 10 suggests significant symptoms.  A worsening CAT score could be explained by an exacerbation, poor medication adherence, poor inhaler technique, or progression of COPD or comorbid conditions.  CAT MCID is 2 points  mMRC: mMRC (Modified Medical Research Council) Dyspnea Scale is used to assess the degree of baseline functional disability in patients of respiratory disease due to dyspnea. No minimal important difference is established. A decrease in score of 1 point or greater is considered a positive change.   Pulmonary Function Assessment:  Pulmonary Function Assessment - 04/04/23 0922       Breath   Shortness of Breath Yes;Limiting activity             Exercise Target Goals: Exercise Program Goal: Individual exercise prescription set using results from initial 6 min walk test and THRR while considering  patient's activity barriers and safety.   Exercise Prescription Goal: Initial  exercise prescription builds to 30-45 minutes a day of aerobic activity, 2-3 days per week.  Home exercise guidelines will be given to patient during program as part of exercise prescription that the participant will acknowledge.  Activity Barriers & Risk Stratification:  Activity Barriers & Cardiac Risk Stratification - 04/04/23 0925       Activity Barriers & Cardiac Risk Stratification   Activity Barriers Deconditioning;Muscular Weakness;Shortness of Breath;History of Falls;Balance Concerns             6 Minute Walk:  6 Minute Walk  Row Name 04/04/23 1011         6 Minute Walk   Phase Initial     Distance 960 feet     Walk Time 6 minutes     # of Rest Breaks 0     MPH 1.82     METS 2.54     RPE 11     Perceived Dyspnea  1     VO2 Peak 8.9     Symptoms No     Resting HR 100 bpm     Resting BP 104/66     Resting Oxygen Saturation  100 %     Exercise Oxygen Saturation  during 6 min walk 97 %     Max Ex. HR 106 bpm     Max Ex. BP 102/62     2 Minute Post BP 110/64       Interval HR   1 Minute HR 100     2 Minute HR 101     3 Minute HR 106     4 Minute HR 100     5 Minute HR 98     6 Minute HR 98     2 Minute Post HR 95     Interval Heart Rate? Yes       Interval Oxygen   Interval Oxygen? Yes     Baseline Oxygen Saturation % 100 %     1 Minute Oxygen Saturation % 98 %     1 Minute Liters of Oxygen 0 L     2 Minute Oxygen Saturation % 97 %     2 Minute Liters of Oxygen 0 L     3 Minute Oxygen Saturation % 97 %     3 Minute Liters of Oxygen 0 L     4 Minute Oxygen Saturation % 97 %     4 Minute Liters of Oxygen 0 L     5 Minute Oxygen Saturation % 97 %     5 Minute Liters of Oxygen 0 L     6 Minute Oxygen Saturation % 97 %     6 Minute Liters of Oxygen 0 L     2 Minute Post Oxygen Saturation % 99 %     2 Minute Post Liters of Oxygen 0 L              Oxygen Initial Assessment:  Oxygen Initial Assessment - 04/04/23 0921       Home Oxygen    Home Oxygen Device None    Sleep Oxygen Prescription None    Home Exercise Oxygen Prescription None    Home Resting Oxygen Prescription None      Initial 6 min Walk   Oxygen Used None      Program Oxygen Prescription   Program Oxygen Prescription None      Intervention   Short Term Goals To learn and understand importance of maintaining oxygen saturations>88%;To learn and demonstrate proper use of respiratory medications;To learn and understand importance of monitoring SPO2 with pulse oximeter and demonstrate accurate use of the pulse oximeter.;To learn and demonstrate proper pursed lip breathing techniques or other breathing techniques.     Long  Term Goals Verbalizes importance of monitoring SPO2 with pulse oximeter and return demonstration;Maintenance of O2 saturations>88%;Exhibits proper breathing techniques, such as pursed lip breathing or other method taught during program session;Compliance with respiratory medication;Demonstrates proper use of MDI's  Oxygen Re-Evaluation:  Oxygen Re-Evaluation     Row Name 04/29/23 0855             Program Oxygen Prescription   Program Oxygen Prescription None         Home Oxygen   Home Oxygen Device None       Sleep Oxygen Prescription None       Home Exercise Oxygen Prescription None       Home Resting Oxygen Prescription None         Goals/Expected Outcomes   Short Term Goals To learn and understand importance of maintaining oxygen saturations>88%;To learn and demonstrate proper use of respiratory medications;To learn and understand importance of monitoring SPO2 with pulse oximeter and demonstrate accurate use of the pulse oximeter.;To learn and demonstrate proper pursed lip breathing techniques or other breathing techniques.        Long  Term Goals Verbalizes importance of monitoring SPO2 with pulse oximeter and return demonstration;Maintenance of O2 saturations>88%;Exhibits proper breathing techniques, such as pursed  lip breathing or other method taught during program session;Compliance with respiratory medication;Demonstrates proper use of MDI's       Goals/Expected Outcomes Compliance and understanding of oxygen saturation monitoring and breath techniques to decrease shortness of breath.                Oxygen Discharge (Final Oxygen Re-Evaluation):  Oxygen Re-Evaluation - 04/29/23 0855       Program Oxygen Prescription   Program Oxygen Prescription None      Home Oxygen   Home Oxygen Device None    Sleep Oxygen Prescription None    Home Exercise Oxygen Prescription None    Home Resting Oxygen Prescription None      Goals/Expected Outcomes   Short Term Goals To learn and understand importance of maintaining oxygen saturations>88%;To learn and demonstrate proper use of respiratory medications;To learn and understand importance of monitoring SPO2 with pulse oximeter and demonstrate accurate use of the pulse oximeter.;To learn and demonstrate proper pursed lip breathing techniques or other breathing techniques.     Long  Term Goals Verbalizes importance of monitoring SPO2 with pulse oximeter and return demonstration;Maintenance of O2 saturations>88%;Exhibits proper breathing techniques, such as pursed lip breathing or other method taught during program session;Compliance with respiratory medication;Demonstrates proper use of MDI's    Goals/Expected Outcomes Compliance and understanding of oxygen saturation monitoring and breath techniques to decrease shortness of breath.             Initial Exercise Prescription:  Initial Exercise Prescription - 04/04/23 1000       Date of Initial Exercise RX and Referring Provider   Date 04/04/23    Referring Provider Vivia Budge   Expected Discharge Date 07/01/23      Recumbant Bike   Level 1    RPM 63    Watts 38    Minutes 15    METs 2      NuStep   Level 1    SPM 79    Minutes 15    METs 2.1      Prescription Details   Frequency  (times per week) 2    Duration Progress to 30 minutes of continuous aerobic without signs/symptoms of physical distress      Intensity   THRR 40-80% of Max Heartrate 59-118    Ratings of Perceived Exertion 11-13    Perceived Dyspnea 0-4      Progression   Progression Continue to progress workloads to maintain  intensity without signs/symptoms of physical distress.      Resistance Training   Training Prescription Yes    Weight blue bands    Reps 10-15             Perform Capillary Blood Glucose checks as needed.  Exercise Prescription Changes:   Exercise Prescription Changes     Row Name 04/08/23 1200 04/22/23 1100 04/22/23 1500 05/01/23 1500       Response to Exercise   Blood Pressure (Admit) 98/58 112/62 110/64 94/60    Blood Pressure (Exercise) 100/62 -- 122/60 --    Blood Pressure (Exit) 104/60 114/62 98/60 92/66     Heart Rate (Admit) 95 bpm 96 bpm 95 bpm 100 bpm    Heart Rate (Exercise) 106 bpm 104 bpm 107 bpm 123 bpm    Heart Rate (Exit) 111 bpm 100 bpm 105 bpm 113 bpm    Oxygen Saturation (Admit) 98 % 98 % 99 % 98 %    Oxygen Saturation (Exercise) 98 % 98 % 98 % 98 %    Oxygen Saturation (Exit) 98 % 99 % 99 % 98 %    Rating of Perceived Exertion (Exercise) 11 11 11 11     Perceived Dyspnea (Exercise) 1 1 1 1     Duration Progress to 30 minutes of  aerobic without signs/symptoms of physical distress Continue with 30 min of aerobic exercise without signs/symptoms of physical distress. Continue with 30 min of aerobic exercise without signs/symptoms of physical distress. Continue with 30 min of aerobic exercise without signs/symptoms of physical distress.    Intensity THRR unchanged THRR unchanged THRR unchanged THRR unchanged      Progression   Progression Continue to progress workloads to maintain intensity without signs/symptoms of physical distress. Continue to progress workloads to maintain intensity without signs/symptoms of physical distress. Continue to progress  workloads to maintain intensity without signs/symptoms of physical distress. Continue to progress workloads to maintain intensity without signs/symptoms of physical distress.      Resistance Training   Training Prescription Yes Yes Yes Yes    Weight blue bands blue bands blue bands blue bands    Reps 10-15 10-15 10-15 10-15    Time 10 Minutes 10 Minutes 10 Minutes 10 Minutes      Recumbant Bike   Level 1 3 3 3     RPM 46 45 -- --    Watts 12 25 -- --    Minutes 15 15 15 15     METs 1.9 2.5 2.8 2.8      NuStep   Level 1 3 3 3     SPM 80 81 82 --    Minutes 15 15 15 15     METs 2 2.3 2.4 2.2      Home Exercise Plan   Plans to continue exercise at -- -- -- Home (comment)  walking or norditrack    Frequency -- -- -- Add 1 additional day to program exercise sessions.    Initial Home Exercises Provided -- -- -- 05/01/23             Exercise Comments:   Exercise Comments     Row Name 04/08/23 1216 05/01/23 1515         Exercise Comments Pt completed first day of group exercise. He exercised on the recumbent bike for 15 min, level 1, METs 1.9. He then exercised on the recumbent stepper for 15 min, level 1, 2.0 METs. Pt tolerated well, able to perform warm up and cool down  standing, including squats. Discussed METs with decent reception. Discussed with pt home exercise plan. He is currently active but not exercising. He has a nordictrack that he can do for 2-3 min. We discussed increasing time as able or walking in his house or store. Encouraged increasing time to 30 min 1-2 days a week. Pt does state that he feels he gets enough exercise with his activity. We discussed the difference. Gave pt information on home exercise programs. He is not interested in a facility.               Exercise Goals and Review:   Exercise Goals     Row Name 04/04/23 9629             Exercise Goals   Increase Physical Activity Yes       Intervention Provide advice, education, support and  counseling about physical activity/exercise needs.;Develop an individualized exercise prescription for aerobic and resistive training based on initial evaluation findings, risk stratification, comorbidities and participant's personal goals.       Expected Outcomes Short Term: Attend rehab on a regular basis to increase amount of physical activity.;Long Term: Exercising regularly at least 3-5 days a week.;Long Term: Add in home exercise to make exercise part of routine and to increase amount of physical activity.       Increase Strength and Stamina Yes       Intervention Provide advice, education, support and counseling about physical activity/exercise needs.;Develop an individualized exercise prescription for aerobic and resistive training based on initial evaluation findings, risk stratification, comorbidities and participant's personal goals.       Expected Outcomes Short Term: Increase workloads from initial exercise prescription for resistance, speed, and METs.;Short Term: Perform resistance training exercises routinely during rehab and add in resistance training at home;Long Term: Improve cardiorespiratory fitness, muscular endurance and strength as measured by increased METs and functional capacity ( )       Able to understand and use rate of perceived exertion (RPE) scale Yes       Intervention Provide education and explanation on how to use RPE scale       Expected Outcomes Short Term: Able to use RPE daily in rehab to express subjective intensity level;Long Term:  Able to use RPE to guide intensity level when exercising independently       Able to understand and use Dyspnea scale Yes       Intervention Provide education and explanation on how to use Dyspnea scale       Expected Outcomes Short Term: Able to use Dyspnea scale daily in rehab to express subjective sense of shortness of breath during exertion;Long Term: Able to use Dyspnea scale to guide intensity level when exercising independently        Knowledge and understanding of Target Heart Rate Range (THRR) Yes       Intervention Provide education and explanation of THRR including how the numbers were predicted and where they are located for reference       Expected Outcomes Short Term: Able to state/look up THRR;Long Term: Able to use THRR to govern intensity when exercising independently;Short Term: Able to use daily as guideline for intensity in rehab       Understanding of Exercise Prescription Yes       Intervention Provide education, explanation, and written materials on patient's individual exercise prescription       Expected Outcomes Short Term: Able to explain program exercise prescription;Long Term: Able to explain home exercise prescription  to exercise independently                Exercise Goals Re-Evaluation :  Exercise Goals Re-Evaluation     Row Name 04/04/23 3664 04/29/23 0852           Exercise Goal Re-Evaluation   Exercise Goals Review Increase Physical Activity;Able to understand and use Dyspnea scale;Understanding of Exercise Prescription;Increase Strength and Stamina;Knowledge and understanding of Target Heart Rate Range (THRR);Able to understand and use rate of perceived exertion (RPE) scale Increase Physical Activity;Able to understand and use Dyspnea scale;Understanding of Exercise Prescription;Increase Strength and Stamina;Knowledge and understanding of Target Heart Rate Range (THRR);Able to understand and use rate of perceived exertion (RPE) scale      Comments Reggie is scheduled to begin exercise on 12/24. Pt has completed 5 days of group exercise, missing 1 for an appointment. He is exercising on the recumbent bike for 15 min, level 3, METs  2.8 with good progression. He then is exercising on the recumbent stepper for 15 min, level 3, 2.4 METs. He is using blue bands currently, 5.8 lbs. He performs warm up and cool down without limitations, including squats. Will progress as tolerated.      Expected  Outcomes Through exercise in rehab and at home, the patient will decrease shortness of breath with daily activities and feel confident in doing an exercise regimen at home. Through exercise in rehab and at home, the patient will decrease shortness of breath with daily activities and feel confident in doing an exercise regimen at home.               Discharge Exercise Prescription (Final Exercise Prescription Changes):  Exercise Prescription Changes - 05/01/23 1500       Response to Exercise   Blood Pressure (Admit) 94/60    Blood Pressure (Exit) 92/66    Heart Rate (Admit) 100 bpm    Heart Rate (Exercise) 123 bpm    Heart Rate (Exit) 113 bpm    Oxygen Saturation (Admit) 98 %    Oxygen Saturation (Exercise) 98 %    Oxygen Saturation (Exit) 98 %    Rating of Perceived Exertion (Exercise) 11    Perceived Dyspnea (Exercise) 1    Duration Continue with 30 min of aerobic exercise without signs/symptoms of physical distress.    Intensity THRR unchanged      Progression   Progression Continue to progress workloads to maintain intensity without signs/symptoms of physical distress.      Resistance Training   Training Prescription Yes    Weight blue bands    Reps 10-15    Time 10 Minutes      Recumbant Bike   Level 3    Minutes 15    METs 2.8      NuStep   Level 3    Minutes 15    METs 2.2      Home Exercise Plan   Plans to continue exercise at Home (comment)   walking or norditrack   Frequency Add 1 additional day to program exercise sessions.    Initial Home Exercises Provided 05/01/23             Nutrition:  Target Goals: Understanding of nutrition guidelines, daily intake of sodium 1500mg , cholesterol 200mg , calories 30% from fat and 7% or less from saturated fats, daily to have 5 or more servings of fruits and vegetables.  BiometricsRenee Ramus - 04/04/23 4034        Post  Biometrics   Grip Strength 22 kg             Nutrition Therapy Plan  and Nutrition Goals:   Nutrition Assessments:  MEDIFICTS Score Key: >=70 Need to make dietary changes  40-70 Heart Healthy Diet <= 40 Therapeutic Level Cholesterol Diet   Picture Your Plate Scores: <46 Unhealthy dietary pattern with much room for improvement. 41-50 Dietary pattern unlikely to meet recommendations for good health and room for improvement. 51-60 More healthful dietary pattern, with some room for improvement.  >60 Healthy dietary pattern, although there may be some specific behaviors that could be improved.    Nutrition Goals Re-Evaluation:   Nutrition Goals Discharge (Final Nutrition Goals Re-Evaluation):   Psychosocial: Target Goals: Acknowledge presence or absence of significant depression and/or stress, maximize coping skills, provide positive support system. Participant is able to verbalize types and ability to use techniques and skills needed for reducing stress and depression.  Initial Review & Psychosocial Screening:  Initial Psych Review & Screening - 04/04/23 0919       Initial Review   Current issues with None Identified      Family Dynamics   Good Support System? Yes    Comments Children      Barriers   Psychosocial barriers to participate in program There are no identifiable barriers or psychosocial needs.      Screening Interventions   Interventions Encouraged to exercise             Quality of Life Scores:  Scores of 19 and below usually indicate a poorer quality of life in these areas.  A difference of  2-3 points is a clinically meaningful difference.  A difference of 2-3 points in the total score of the Quality of Life Index has been associated with significant improvement in overall quality of life, self-image, physical symptoms, and general health in studies assessing change in quality of life.  PHQ-9: Review Flowsheet       04/04/2023  Depression screen PHQ 2/9  Decreased Interest 0  Down, Depressed, Hopeless 0  PHQ - 2  Score 0  Altered sleeping 0  Tired, decreased energy 0  Change in appetite 0  Feeling bad or failure about yourself  0  Trouble concentrating 0  Moving slowly or fidgety/restless 0  Suicidal thoughts 0  PHQ-9 Score 0   Interpretation of Total Score  Total Score Depression Severity:  1-4 = Minimal depression, 5-9 = Mild depression, 10-14 = Moderate depression, 15-19 = Moderately severe depression, 20-27 = Severe depression   Psychosocial Evaluation and Intervention:  Psychosocial Evaluation - 04/04/23 0919       Psychosocial Evaluation & Interventions   Interventions Encouraged to exercise with the program and follow exercise prescription    Comments Pt denies any psychosocial barrieres at this time. Will continue to monitor.    Expected Outcomes For Reggie to participate in rehab free of psychosocial concerns.    Continue Psychosocial Services  No Follow up required             Psychosocial Re-Evaluation:  Psychosocial Re-Evaluation     Row Name 04/07/23 0946 04/28/23 0840           Psychosocial Re-Evaluation   Current issues with None Identified None Identified      Comments No changes since orientation. Reggie is scheduled to start the program on 04/08/23 At 30-day re-evaluation, Reggie still denies any psy/soc issues or barriers      Expected Outcomes For Reggie to  participate in rehab free of psychosocial concerns. For Reggie to participate in rehab free of psychosocial concerns.      Interventions Encouraged to attend Pulmonary Rehabilitation for the exercise Encouraged to attend Pulmonary Rehabilitation for the exercise      Continue Psychosocial Services  No Follow up required No Follow up required               Psychosocial Discharge (Final Psychosocial Re-Evaluation):  Psychosocial Re-Evaluation - 04/28/23 0840       Psychosocial Re-Evaluation   Current issues with None Identified    Comments At 30-day re-evaluation, Reggie still denies any psy/soc  issues or barriers    Expected Outcomes For Reggie to participate in rehab free of psychosocial concerns.    Interventions Encouraged to attend Pulmonary Rehabilitation for the exercise    Continue Psychosocial Services  No Follow up required             Education: Education Goals: Education classes will be provided on a weekly basis, covering required topics. Participant will state understanding/return demonstration of topics presented.  Learning Barriers/Preferences:  Learning Barriers/Preferences - 04/04/23 1610       Learning Barriers/Preferences   Learning Barriers Sight    Learning Preferences None             Education Topics: Know Your Numbers Group instruction that is supported by a PowerPoint presentation. Instructor discusses importance of knowing and understanding resting, exercise, and post-exercise oxygen saturation, heart rate, and blood pressure. Oxygen saturation, heart rate, blood pressure, rating of perceived exertion, and dyspnea are reviewed along with a normal range for these values.  Flowsheet Row PULMONARY REHAB CHRONIC OBSTRUCTIVE PULMONARY DISEASE from 04/17/2023 in [facility] for Heart, Vascular, & Lung Health  Date 04/17/23  Educator EP  Instruction Review Code 1- Verbalizes Understanding       Exercise for the Pulmonary Patient Group instruction that is supported by a PowerPoint presentation. Instructor discusses benefits of exercise, core components of exercise, frequency, duration, and intensity of an exercise routine, importance of utilizing pulse oximetry during exercise, safety while exercising, and options of places to exercise outside of rehab.  Flowsheet Row PULMONARY REHAB CHRONIC OBSTRUCTIVE PULMONARY DISEASE from 04/10/2023 in [facility] for Heart, Vascular, & Lung Health  Date 04/10/23  Educator EP  Instruction Review Code 1- Verbalizes Understanding       MET Level  Group  instruction provided by PowerPoint, verbal discussion, and written material to support subject matter. Instructor reviews what METs are and how to increase METs.    Pulmonary Medications Verbally interactive group education provided by instructor with focus on inhaled medications and proper administration.   Anatomy and Physiology of the Respiratory System Group instruction provided by PowerPoint, verbal discussion, and written material to support subject matter. Instructor reviews respiratory cycle and anatomical components of the respiratory system and their functions. Instructor also reviews differences in obstructive and restrictive respiratory diseases with examples of each.    Oxygen Safety Group instruction provided by PowerPoint, verbal discussion, and written material to support subject matter. There is an overview of "What is Oxygen" and "Why do we need it".  Instructor also reviews how to create a safe environment for oxygen use, the importance of using oxygen as prescribed, and the risks of noncompliance. There is a brief discussion on traveling with oxygen and resources the patient may utilize.   Oxygen Use Group instruction provided by PowerPoint, verbal discussion, and written material to  discuss how supplemental oxygen is prescribed and different types of oxygen supply systems. Resources for more information are provided.  Flowsheet Row PULMONARY REHAB CHRONIC OBSTRUCTIVE PULMONARY DISEASE from 05/01/2023 in [facility] for Heart, Vascular, & Lung Health  Date 05/01/23  Educator RT  Instruction Review Code 1- Verbalizes Understanding       Breathing Techniques Group instruction that is supported by demonstration and informational handouts. Instructor discusses the benefits of pursed lip and diaphragmatic breathing and detailed demonstration on how to perform both.     Risk Factor Reduction Group instruction that is supported by a PowerPoint  presentation. Instructor discusses the definition of a risk factor, different risk factors for pulmonary disease, and how the heart and lungs work together.   Pulmonary Diseases Group instruction provided by PowerPoint, verbal discussion, and written material to support subject matter. Instructor gives an overview of the different type of pulmonary diseases. There is also a discussion on risk factors and symptoms as well as ways to manage the diseases.   Stress and Energy Conservation Group instruction provided by PowerPoint, verbal discussion, and written material to support subject matter. Instructor gives an overview of stress and the impact it can have on the body. Instructor also reviews ways to reduce stress. There is also a discussion on energy conservation and ways to conserve energy throughout the day.   Warning Signs and Symptoms Group instruction provided by PowerPoint, verbal discussion, and written material to support subject matter. Instructor reviews warning signs and symptoms of stroke, heart attack, cold and flu. Instructor also reviews ways to prevent the spread of infection.   Other Education Group or individual verbal, written, or video instructions that support the educational goals of the pulmonary rehab program.    Knowledge Questionnaire Score:  Knowledge Questionnaire Score - 04/04/23 0940       Knowledge Questionnaire Score   Pre Score 12/18             Core Components/Risk Factors/Patient Goals at Admission:  Personal Goals and Risk Factors at Admission - 04/04/23 0921       Core Components/Risk Factors/Patient Goals on Admission   Tobacco Cessation Yes    Number of packs per day 2-3 cigs/day    Intervention Assist the participant in steps to quit. Provide individualized education and counseling about committing to Tobacco Cessation, relapse prevention, and pharmacological support that can be provided by physician.;Education officer, environmental,  assist with locating and accessing local/national Quit Smoking programs, and support quit date choice.    Expected Outcomes Short Term: Will demonstrate readiness to quit, by selecting a quit date.;Short Term: Will quit all tobacco product use, adhering to prevention of relapse plan.;Long Term: Complete abstinence from all tobacco products for at least 12 months from quit date.    Improve shortness of breath with ADL's Yes    Intervention Provide education, individualized exercise plan and daily activity instruction to help decrease symptoms of SOB with activities of daily living.    Expected Outcomes Short Term: Improve cardiorespiratory fitness to achieve a reduction of symptoms when performing ADLs;Long Term: Be able to perform more ADLs without symptoms or delay the onset of symptoms             Core Components/Risk Factors/Patient Goals Review:   Goals and Risk Factor Review     Row Name 04/07/23 0947 04/15/23 1519 04/28/23 0843 04/29/23 1512       Core Components/Risk Factors/Patient Goals Review   Personal Goals Review Improve  shortness of breath with ADL's;Develop more efficient breathing techniques such as purse lipped breathing and diaphragmatic breathing and practicing self-pacing with activity.;Tobacco Cessation Tobacco Cessation Improve shortness of breath with ADL's;Develop more efficient breathing techniques such as purse lipped breathing and diaphragmatic breathing and practicing self-pacing with activity.;Tobacco Cessation Tobacco Cessation    Review Unable to assess goals. Reggie is scheduled to start the program on 04/08/23 Reggie is currently smoking a total of 6 cigarettes a day. He is struggling to quit however he wants to quit. Discussed tips and methods. He is contemplative on a quit date. Gave resources for him to review. Will f/u. At 30-day re-evaluation for core components/risk factors/patient goals review are as follows: Goal met on developing more efficient breathing  techniques such as purse lipped breathing and diaphragmatic breathing; and practicing self-pacing with activity. Reggie is able to initiate PLB on his own with exertion. He also knows how to slow down and pace himself when he is short of breath. Goal in progress on improving his shortness of breath with ADLs. He has increased both his workload and METs while maintaining his oxygen saturation >88% on room air. Goal in action for tobacco cessation. Reggie is down to smoking 6 cigarettes/day. We are continually working with Reggie on a plan to quit. Resources and support have been provided. We will continue to monitor and assess Reggie's progress in the program. He will continue to benefit from PR for nutrition, education, exercise, and lifestyle modification. Spoke with pt regarding his readiness for tobacco cessation. Pt feels like he is making progress with decreasing the amount he is smoking. He reports he is only smoking 1/2 of the cigarette and putting it out until later. Discussed with pt looking for other ways to decrease ie waiting 5 min when he has a craving or distracting himself. Encouraged pt to set short term and long term quit dates. He agreed but is still contemplative on a firm date. He sts he is taking "pills" from the [place] for smoking cessation but he has not seen a difference in his desire to smoke.    Expected Outcomes For Reggie to develop more efficient breathing techniques such as purse lipped breathing and diaphragmatic breathing, and self pacing with activity. For Reggie to improve his shortness of breath with ADLs and to stop smoking For pt to set a quit date. For Reggie to develop more efficient breathing techniques such as purse lipped breathing and diaphragmatic breathing, and self pacing with activity. For Reggie to improve his shortness of breath with ADLs and to stop smoking For pt to set a quit date.             Core Components/Risk Factors/Patient Goals at Discharge (Final  Review):   Goals and Risk Factor Review - 04/29/23 1512       Core Components/Risk Factors/Patient Goals Review   Personal Goals Review Tobacco Cessation    Review Spoke with pt regarding his readiness for tobacco cessation. Pt feels like he is making progress with decreasing the amount he is smoking. He reports he is only smoking 1/2 of the cigarette and putting it out until later. Discussed with pt looking for other ways to decrease ie waiting 5 min when he has a craving or distracting himself. Encouraged pt to set short term and long term quit dates. He agreed but is still contemplative on a firm date. He sts he is taking "pills" from the [place] for smoking cessation but he has not seen a difference  in his desire to smoke.    Expected Outcomes For pt to set a quit date.             ITP Comments:Pt is making expected progress toward Pulmonary Rehab goals after completing 7 session(s). Recommend continued exercise, life style modification, education, and utilization of breathing techniques to increase stamina and strength, while also decreasing shortness of breath with exertion.  Dr. Mechele Collin is Medical Director for Pulmonary Rehab at [facility].

## 2023-05-08 ENCOUNTER — Encounter (HOSPITAL_COMMUNITY)
Admission: RE | Admit: 2023-05-08 | Discharge: 2023-05-08 | Disposition: A | Discharge status: Home / Self Care | Payer: No Typology Code available for payment source | Source: Ambulatory Visit | Attending: Pulmonary Disease | Admitting: Pulmonary Disease

## 2023-05-08 DIAGNOSIS — Z5189 Encounter for other specified aftercare: Secondary | ICD-10-CM | POA: Diagnosis not present

## 2023-05-08 DIAGNOSIS — J449 Chronic obstructive pulmonary disease, unspecified: Secondary | ICD-10-CM

## 2023-05-08 NOTE — Progress Notes (Signed)
Daily Session Note  Patient Details  Name: Nathan YOHO Sr. MRN: [id number] Date of Birth: 06-23-1950 Referring Provider:   Doristine Devoid Pulmonary Rehab Walk Test from 04/04/2023 in [facility] for Heart, Vascular, & Lung Health  Referring Provider [name]       Encounter Date: 05/08/2023  Check In:  Session Check In - 05/08/23 1323       Check-In   Supervising physician immediately available to respond to emergencies [facility] MD immediately available    Physician(s) Robin Searing, NP    Location [facility]    Staff Present Essie Hart, RN, BSN;Stacyann Mcconaughy Charlean Sanfilippo, MS, ACSM-CEP, Exercise Physiologist    Virtual Visit No    Medication changes reported     No    Fall or balance concerns reported    No    Tobacco Cessation No Change    Warm-up and Cool-down Performed as group-led instruction    Resistance Training Performed Yes    VAD Patient? No    PAD/SET Patient? No      Pain Assessment   Currently in Pain? No/denies    Multiple Pain Sites No             Capillary Blood Glucose: No results found for this or any previous visit (from the past 24 hours).    Social History   Tobacco Use  Smoking Status Every Day   Current packs/day: 0.00   Average packs/day: 0.3 packs/day for 40.0 years (10.0 ttl pk-yrs)   Types: Cigarettes   Start date: 12/04/1977   Last attempt to quit: 12/04/2017   Years since quitting: 5.4  Smokeless Tobacco Never  Tobacco Comments   Smokes 3-4 cigs/ day    Goals Met:  Proper associated with RPD/PD & O2 Sat Independence with exercise equipment Exercise tolerated well No report of concerns or symptoms today Strength training completed today  Goals Unmet:  Not Applicable  Comments: Service time is from 1306 to 1433.    Dr. Mechele Collin is Medical Director for Pulmonary Rehab at [facility].

## 2023-05-13 ENCOUNTER — Encounter (HOSPITAL_COMMUNITY)
Admission: RE | Admit: 2023-05-13 | Discharge: 2023-05-13 | Disposition: A | Discharge status: Home / Self Care | Payer: No Typology Code available for payment source | Source: Ambulatory Visit | Attending: Pulmonary Disease

## 2023-05-13 DIAGNOSIS — J449 Chronic obstructive pulmonary disease, unspecified: Secondary | ICD-10-CM

## 2023-05-13 DIAGNOSIS — Z5189 Encounter for other specified aftercare: Secondary | ICD-10-CM | POA: Diagnosis not present

## 2023-05-13 NOTE — Progress Notes (Signed)
Daily Session Note  Patient Details  Name: Nathan GRACA Sr. MRN: [id number] Date of Birth: 1950/10/31 Referring Provider:   Doristine Devoid Pulmonary Rehab Walk Test from 04/04/2023 in [facility] for Heart, Vascular, & Lung Health  Referring Provider [name]       Encounter Date: 05/13/2023  Check In:  Session Check In - 05/13/23 1357       Check-In   Supervising physician immediately available to respond to emergencies [facility] immediately available    Physician(s) Tereso Newcomer, PA-C    [facility]    Staff Present Essie Hart, RN, BSN;Kimaria Struthers Katrinka Blazing, Zella Richer, MS, ACSM-CEP, Exercise Physiologist;David Manus Gunning, MS, ACSM-CEP, CCRP, Exercise Physiologist    Virtual Visit No    Medication changes reported     No    Fall or balance concerns reported    No    Tobacco Cessation No Change    Warm-up and Cool-down Performed as group-led instruction    Resistance Training Performed Yes    VAD Patient? No    PAD/SET Patient? No      Pain Assessment   Currently in Pain? No/denies    Multiple Pain Sites No             Capillary Blood Glucose: No results found for this or any previous visit (from the past 24 hours).    Social History   Tobacco Use  Smoking Status Every Day   Current packs/day: 0.00   Average packs/day: 0.3 packs/day for 40.0 years (10.0 ttl pk-yrs)   Types: Cigarettes   Start date: 12/04/1977   Last attempt to quit: 12/04/2017   Years since quitting: 5.4  Smokeless Tobacco Never  Tobacco Comments   Smokes 3-4 cigs/ day    Goals Met:  Proper associated with RPD/PD & O2 Sat Independence with exercise equipment Exercise tolerated well No report of concerns or symptoms today Strength training completed today  Goals Unmet:  Not Applicable  Comments: Service time is from 1309 to 1435.    Dr. Mechele Collin is Medical Director for Pulmonary Rehab at [facility].

## 2023-05-15 ENCOUNTER — Encounter (HOSPITAL_COMMUNITY)
Admission: RE | Admit: 2023-05-15 | Discharge: 2023-05-15 | Disposition: A | Discharge status: Home / Self Care | Payer: No Typology Code available for payment source | Source: Ambulatory Visit | Attending: Pulmonary Disease | Admitting: Pulmonary Disease

## 2023-05-15 DIAGNOSIS — Z5189 Encounter for other specified aftercare: Secondary | ICD-10-CM | POA: Diagnosis not present

## 2023-05-15 DIAGNOSIS — J449 Chronic obstructive pulmonary disease, unspecified: Secondary | ICD-10-CM

## 2023-05-15 NOTE — Progress Notes (Signed)
Daily Session Note  Patient Details  Name: Nathan VALE Sr. MRN: [id number] Date of Birth: 11-26-1950 Referring Provider:   Doristine Palmer Pulmonary Rehab Walk Test from 04/04/2023 in [facility] for Heart, Vascular, & Lung Health  Referring Provider Nathan Palmer  [Nathan Palmer]       Encounter Date: 05/15/2023  Check In:  Session Check In - 05/15/23 1322       Check-In   Supervising physician immediately available to respond to emergencies [facility] immediately available    Physician(s) Edd Fabian, NP    [facility]    Staff Present Essie Hart, RN, BSN;Arianna Delsanto Katrinka Blazing, Zella Richer, MS, ACSM-CEP, Exercise Physiologist;Randi Idelle Crouch BS, ACSM-CEP, Exercise Physiologist    Virtual Visit No    Medication changes reported     No    Fall or balance concerns reported    No    Tobacco Cessation No Change    Warm-up and Cool-down Performed as group-led instruction    Resistance Training Performed Yes    VAD Patient? No    PAD/SET Patient? No      Pain Assessment   Currently in Pain? No/denies    Multiple Pain Sites No             Capillary Blood Glucose: No results found for this or any previous visit (from the past 24 hours).    Social History   Tobacco Use  Smoking Status Every Day   Current packs/day: 0.00   Average packs/day: 0.3 packs/day for 40.0 years (10.0 ttl pk-yrs)   Types: Cigarettes   Start date: 12/04/1977   Last attempt to quit: 12/04/2017   Years since quitting: 5.4  Smokeless Tobacco Never  Tobacco Comments   Smokes 3-4 cigs/ day    Goals Met:  Proper associated with RPD/PD & O2 Sat Independence with exercise equipment Exercise tolerated well No report of concerns or symptoms today Strength training completed today  Goals Unmet:  Not Applicable  Comments: Service time is from 1312 to 1436.    Dr. Mechele Collin is Medical Director for Pulmonary Rehab at [facility].

## 2023-05-20 ENCOUNTER — Encounter (HOSPITAL_COMMUNITY)
Admission: RE | Admit: 2023-05-20 | Discharge: 2023-05-20 | Disposition: A | Discharge status: Home / Self Care | Payer: No Typology Code available for payment source | Source: Ambulatory Visit | Attending: Pulmonary Disease | Admitting: Pulmonary Disease

## 2023-05-20 VITALS — Wt 132.7 lb

## 2023-05-20 DIAGNOSIS — J449 Chronic obstructive pulmonary disease, unspecified: Secondary | ICD-10-CM | POA: Diagnosis present

## 2023-05-20 NOTE — Progress Notes (Signed)
Daily Session Note  Patient Details  Name: Nathan Palmer Sr. MRN: [id number] Date of Birth: [birth year] Referring Provider:   Doristine Devoid Pulmonary Rehab Walk Test from 04/04/2023 in [facility] for Heart, Vascular, & Lung Health  Referring Provider [name]       Encounter Date: 05/20/2023  Check In:  Session Check In - 05/20/23 1328       Check-In   Supervising physician immediately available to respond to emergencies [facility] immediately available    Physician(s) Edd Fabian, NP    [facility]    Staff Present Essie Hart, RN, BSN;Casey Katrinka Blazing, Zella Richer, MS, ACSM-CEP, Exercise Physiologist;Randi Idelle Crouch BS, ACSM-CEP, Exercise Physiologist    Virtual Visit No    Medication changes reported     No    Fall or balance concerns reported    No    Tobacco Cessation No Change    Warm-up and Cool-down Performed as group-led instruction    Resistance Training Performed Yes    VAD Patient? No    PAD/SET Patient? No      Pain Assessment   Currently in Pain? No/denies    Multiple Pain Sites No             Capillary Blood Glucose: No results found for this or any previous visit (from the past 24 hours).   Exercise Prescription Changes - 05/20/23 1500       Response to Exercise   Blood Pressure (Admit) 112/58    Blood Pressure (Exercise) 126/60    Blood Pressure (Exit) 116/60    Heart Rate (Admit) 97 bpm    Heart Rate (Exercise) 113 bpm    Heart Rate (Exit) 108 bpm    Oxygen Saturation (Admit) 97 %    Oxygen Saturation (Exercise) 95 %    Oxygen Saturation (Exit) 97 %    Rating of Perceived Exertion (Exercise) 14    Perceived Dyspnea (Exercise) 1    Duration Continue with 30 min of aerobic exercise without signs/symptoms of physical distress.    Intensity THRR unchanged      Progression   Progression Continue to progress workloads to maintain intensity without signs/symptoms of physical distress.       Resistance Training   Training Prescription Yes    Weight blue bands    Reps 10-15    Time 10 Minutes      Recumbant Bike   Level 3    RPM 33    Watts 24    Minutes 15    METs 2.7      NuStep   Level 4    SPM 87    Minutes 15    METs 2.7             Social History   Tobacco Use  Smoking Status Every Day   Current packs/day: 0.00   Average packs/day: 0.3 packs/day for 40.0 years (10.0 ttl pk-yrs)   Types: Cigarettes   Start date: 12/04/1977   Last attempt to quit: 12/04/2017   Years since quitting: 5.4  Smokeless Tobacco Never  Tobacco Comments   Smokes 3-4 cigs/ day    Goals Met:  Exercise tolerated well No report of concerns or symptoms today Strength training completed today  Goals Unmet:  Not Applicable  Comments: Service time is from 1316 to 1440    Dr. Mechele Collin is Medical Director for Pulmonary Rehab at [facility].

## 2023-05-22 ENCOUNTER — Encounter (HOSPITAL_COMMUNITY)
Admission: RE | Admit: 2023-05-22 | Discharge: 2023-05-22 | Disposition: A | Discharge status: Home / Self Care | Payer: No Typology Code available for payment source | Source: Ambulatory Visit | Attending: Pulmonary Disease | Admitting: Pulmonary Disease

## 2023-05-22 DIAGNOSIS — J449 Chronic obstructive pulmonary disease, unspecified: Secondary | ICD-10-CM | POA: Diagnosis not present

## 2023-05-22 NOTE — Progress Notes (Signed)
 Daily Session Note  Patient Details  Name: KAYVEN ALDACO Sr. MRN: [id number] Date of Birth: 30-May-1950 Referring Provider:   Conrad Ports Pulmonary Rehab Walk Test from 04/04/2023 in [facility] for Heart, Vascular, & Lung Health  Referring Provider [name]       Encounter Date: 05/22/2023  Check In:  Session Check In - 05/22/23 1325       Check-In   Supervising physician immediately available to respond to emergencies [facility] immediately available    Physician(s) Rosabel Mose, NP    [facility]    Staff Present Ronal Levin, RN, BSN;Casey Claudene, Neita Moats, MS, ACSM-CEP, Exercise Physiologist;Randi Midge BS, ACSM-CEP, Exercise Physiologist    Virtual Visit No    Medication changes reported     No    Fall or balance concerns reported    No    Tobacco Cessation No Change    Warm-up and Cool-down Performed as group-led instruction    Resistance Training Performed Yes    VAD Patient? No    PAD/SET Patient? No      Pain Assessment   Currently in Pain? No/denies    Multiple Pain Sites No             Capillary Blood Glucose: No results found for this or any previous visit (from the past 24 hours).    Social History   Tobacco Use  Smoking Status Every Day   Current packs/day: 0.00   Average packs/day: 0.3 packs/day for 40.0 years (10.0 ttl pk-yrs)   Types: Cigarettes   Start date: 12/04/1977   Last attempt to quit: 12/04/2017   Years since quitting: 5.4  Smokeless Tobacco Never  Tobacco Comments   Smokes 3-4 cigs/ day    Goals Met:  Exercise tolerated well No report of concerns or symptoms today Strength training completed today  Goals Unmet:  N/A   Comments: Service time is from 1307 to 1439    Dr. Slater Staff is Medical Director for Pulmonary Rehab at [facility].

## 2023-05-27 ENCOUNTER — Encounter (HOSPITAL_COMMUNITY): Payer: No Typology Code available for payment source

## 2023-05-29 ENCOUNTER — Encounter (HOSPITAL_COMMUNITY)
Admission: RE | Admit: 2023-05-29 | Discharge: 2023-05-29 | Disposition: A | Discharge status: Home / Self Care | Payer: No Typology Code available for payment source | Source: Ambulatory Visit | Attending: Pulmonary Disease

## 2023-05-29 DIAGNOSIS — J449 Chronic obstructive pulmonary disease, unspecified: Secondary | ICD-10-CM

## 2023-05-29 NOTE — Progress Notes (Signed)
Daily Session Note  Patient Details  Name: MIRZA FESSEL Sr. MRN: [id number] Date of Birth: Jun 14, 1950 Referring Provider:   Doristine Devoid Pulmonary Rehab Walk Test from 04/04/2023 in [facility] for Heart, Vascular, & Lung Health  Referring Provider [name]       Encounter Date: 05/29/2023  Check In:  Session Check In - 05/29/23 1340       Check-In   Supervising physician immediately available to respond to emergencies [facility] MD immediately available    Physician(s) Rise Paganini, NP    Location [facility]    Staff Present Essie Hart, RN, BSN;Casey Charlean Sanfilippo, MS, ACSM-CEP, Exercise Physiologist    Virtual Visit No    Medication changes reported     No    Fall or balance concerns reported    No    Tobacco Cessation No Change    Warm-up and Cool-down Performed as group-led instruction    Resistance Training Performed Yes    VAD Patient? No    PAD/SET Patient? No      Pain Assessment   Currently in Pain? No/denies    Multiple Pain Sites No             Capillary Blood Glucose: No results found for this or any previous visit (from the past 24 hours).    Social History   Tobacco Use  Smoking Status Every Day   Current packs/day: 0.00   Average packs/day: 0.3 packs/day for 40.0 years (10.0 ttl pk-yrs)   Types: Cigarettes   Start date: 12/04/1977   Last attempt to quit: 12/04/2017   Years since quitting: 5.4  Smokeless Tobacco Never  Tobacco Comments   Smokes 3-4 cigs/ day    Goals Met:  Independence with exercise equipment Exercise tolerated well No report of concerns or symptoms today Strength training completed today  Goals Unmet:  Not Applicable  Comments: Service time is from 1314 to 1449    Dr. Mechele Collin is Medical Director for Pulmonary Rehab at [facility].

## 2023-06-03 ENCOUNTER — Encounter (HOSPITAL_COMMUNITY)
Admission: RE | Admit: 2023-06-03 | Discharge: 2023-06-03 | Disposition: A | Discharge status: Home / Self Care | Payer: No Typology Code available for payment source | Source: Ambulatory Visit | Attending: Pulmonary Disease | Admitting: Pulmonary Disease

## 2023-06-03 VITALS — Wt 135.6 lb

## 2023-06-03 DIAGNOSIS — J449 Chronic obstructive pulmonary disease, unspecified: Secondary | ICD-10-CM | POA: Diagnosis not present

## 2023-06-03 NOTE — Progress Notes (Signed)
 Daily Session Note  Patient Details  Name: Nathan SANGIOVANNI Sr. MRN: [id number] Date of Birth: 04/04/1951 Referring Provider:   Doristine Devoid Pulmonary Rehab Walk Test from 04/04/2023 in [facility]  Referring Provider [name]       Encounter Date: 06/03/2023  Check In:  Session Check In - 06/03/23 1330       Check-In   Supervising physician immediately available to respond to emergencies [facility] MD immediately available    Physician(s) Carlyon Shadow, NP    Location [facility]    Staff Present Essie Hart, RN, BSN;Casey Katrinka Blazing, Zella Richer, MS, ACSM-CEP, Exercise Physiologist;Zuleica Seith Dionisio Paschal, ACSM-CEP, Exercise Physiologist;Olinty Peggye Pitt, MS, ACSM-CEP, Exercise Physiologist;Johnny Hale Bogus, MS, Exercise Physiologist    Virtual Visit No    Medication changes reported     No    Fall or balance concerns reported    No    Tobacco Cessation No Change    Warm-up and Cool-down Performed as group-led instruction    Resistance Training Performed Yes    VAD Patient? No    PAD/SET Patient? No      Pain Assessment   Currently in Pain? No/denies    Multiple Pain Sites No             Capillary Blood Glucose: No results found for this or any previous visit (from the past 24 hours).   Exercise Prescription Changes - 06/03/23 1500       Response to Exercise   Blood Pressure (Admit) 104/60    Blood Pressure (Exercise) 122/62    Blood Pressure (Exit) 96/60    Heart Rate (Admit) 98 bpm    Heart Rate (Exercise) 97 bpm    Heart Rate (Exit) 115 bpm    Oxygen Saturation (Admit) 98 %    Oxygen Saturation (Exercise) 98 %    Oxygen Saturation (Exit) 97 %    Rating of Perceived Exertion (Exercise) 11    Perceived Dyspnea (Exercise) 1    Duration Continue with 30 min of aerobic exercise without signs/symptoms of physical distress.    Intensity THRR unchanged      Progression   Progression  Continue to progress workloads to maintain intensity without signs/symptoms of physical distress.      Resistance Training   Training Prescription Yes    Weight blue bands    Reps 10-15    Time 10 Minutes      Recumbant Bike   Level 4    RPM 78    Minutes 15    METs 2.6      NuStep   Level 5    Minutes 15    METs 2.9             Social History   Tobacco Use  Smoking Status Every Day   Current packs/day: 0.00   Average packs/day: 0.3 packs/day for 40.0 years (10.0 ttl pk-yrs)   Types: Cigarettes   Start date: 12/04/1977   Last attempt to quit: 12/04/2017   Years since quitting: 5.4  Smokeless Tobacco Never  Tobacco Comments   Smokes 3-4 cigs/ day    Goals Met:  Independence with exercise equipment Exercise tolerated well No report of concerns or symptoms today Strength training completed today  Goals Unmet:  Not Applicable  Comments: Service time is from 1310 to 1443.    Dr. Mechele Collin is Medical Director for Pulmonary Rehab at [facility].

## 2023-06-04 NOTE — Progress Notes (Signed)
 Pulmonary Individual Treatment Plan  Patient Details  Name: Nathan ASPER Sr. MRN: [id number] Date of Birth: May 02, 1950 Referring Provider:   Doristine Devoid Pulmonary Rehab Walk Test from 04/04/2023 in [facility]  Referring Provider [name]       Initial Encounter Date:  Flowsheet Row Pulmonary Rehab Walk Test from 04/04/2023 in [facility]  Date 04/04/23       Visit Diagnosis: Chronic obstructive pulmonary disease, unspecified COPD type (HCC)  Patient's Home Medications on Admission:   Current Outpatient Medications:    acetaminophen (TYLENOL) 500 MG tablet, Take 500-1,000 mg by mouth 3 (three) times daily as needed for mild pain, headache or moderate pain., Disp: , Rfl:    albuterol (VENTOLIN HFA) 108 (90 Base) MCG/ACT inhaler, Inhale into the lungs., Disp: , Rfl:    allopurinol (ZYLOPRIM) 300 MG tablet, Take 300 mg by mouth daily. (Patient not taking: Reported on 04/04/2023), Disp: , Rfl:    amLODipine (NORVASC) 5 MG tablet, Take 5 mg by mouth daily., Disp: , Rfl:    aspirin EC 81 MG tablet, Take 81 mg by mouth daily., Disp: , Rfl:    atorvastatin (LIPITOR) 80 MG tablet, Take 1 tablet (80 mg total) by mouth daily at 6 PM., Disp: 90 tablet, Rfl: 3   azithromycin (ZITHROMAX) 250 MG tablet, Take by mouth. (Patient not taking: Reported on 04/04/2023), Disp: , Rfl:    buPROPion (ZYBAN) 150 MG 12 hr tablet, Take 150 mg by mouth every other day. (Patient not taking: Reported on 04/04/2023), Disp: , Rfl:    clopidogrel (PLAVIX) 75 MG tablet, Take 1 tablet (75 mg total) by mouth daily with breakfast., Disp: 90 tablet, Rfl: 3   diclofenac Sodium (VOLTAREN) 1 % GEL, Apply 2 g topically 4 (four) times daily as needed (pain). (Patient not taking: Reported on 04/04/2023), Disp: , Rfl:    docusate sodium (COLACE) 100 MG capsule, Take 200 mg by mouth daily., Disp: ,  Rfl:    finasteride (PROSCAR) 5 MG tablet, Take 5 mg by mouth daily., Disp: , Rfl:    fluticasone (FLONASE) 50 MCG/ACT nasal spray, Place 2 sprays into both nostrils daily., Disp: , Rfl:    ipratropium (ATROVENT) 0.03 % nasal spray, Place into the nose. (Patient not taking: Reported on 04/04/2023), Disp: , Rfl:    latanoprost (XALATAN) 0.005 % ophthalmic solution, Place 1 drop into both eyes at bedtime. (Patient not taking: Reported on 04/04/2023), Disp: , Rfl:    Mometasone Furoate 200 MCG/ACT AERO, Take by mouth., Disp: , Rfl:    montelukast (SINGULAIR) 10 MG tablet, Take 1 tablet by mouth every evening., Disp: , Rfl:    nicotine polacrilex (COMMIT) 2 MG lozenge, Take by mouth. (Patient not taking: Reported on 04/04/2023), Disp: , Rfl:    omeprazole (PRILOSEC OTC) 20 MG tablet, Take 1 tablet (20 mg total) by mouth daily., Disp: 30 tablet, Rfl: 2   predniSONE (DELTASONE) 20 MG tablet, Take by mouth. (Patient not taking: Reported on 04/04/2023), Disp: , Rfl:    sulfamethoxazole-trimethoprim (BACTRIM DS) 800-160 MG tablet, Take 1 tablet by mouth 3 (three) times a week. (Patient not taking: Reported on 04/04/2023), Disp: 12 tablet, Rfl: 0   tamsulosin (FLOMAX) 0.4 MG CAPS capsule, Take 2 capsules by mouth daily., Disp: , Rfl:    Tiotropium Bromide-Olodaterol 2.5-2.5 MCG/ACT AERS, Take by mouth., Disp: , Rfl:   Past Medical History:  Past Medical History:  Diagnosis Date   Arthritis    Glaucoma    Hyperlipidemia    Hypertension    Peripheral vascular disease (HCC)     Tobacco Use: Social History   Tobacco Use  Smoking Status Every Day   Current packs/day: 0.00   Average packs/day: 0.3 packs/day for 40.0 years (10.0 ttl pk-yrs)   Types: Cigarettes   Start date: 12/04/1977   Last attempt to quit: 12/04/2017   Years since quitting: 5.5  Smokeless Tobacco Never  Tobacco Comments   Smokes 3-4 cigs/ day    Labs: Review Flowsheet       Latest Ref Rng & Units 12/29/2017 02/03/2022  Labs  for ITP Cardiac and Pulmonary Rehab  Cholestrol 100 - 199 mg/dL 409  -  LDL (calc) 0 - 99 mg/dL 18  -  HDL-C >81 mg/dL 46  -  Trlycerides <191 mg/dL 478  88     Capillary Blood Glucose: No results found for: "GLUCAP"   Pulmonary Assessment Scores:  Pulmonary Assessment Scores     Row Name 04/04/23 0922         ADL UCSD   ADL Phase Entry     SOB Score total 61       CAT Score   CAT Score 18       mMRC Score   mMRC Score 4             UCSD: Self-administered rating of dyspnea associated with activities of daily living (ADLs) 6-point scale (0 = "not at all" to 5 = "maximal or unable to do because of breathlessness")  Scoring Scores range from 0 to 120.  Minimally important difference is 5 units  CAT: CAT can identify the health impairment of COPD patients and is better correlated with disease progression.  CAT has a scoring range of zero to 40. The CAT score is classified into four groups of low (less than 10), medium (10 - 20), high (21-30) and very high (31-40) based on the impact level of disease on health status. A CAT score over 10 suggests significant symptoms.  A worsening CAT score could be explained by an exacerbation, poor medication adherence, poor inhaler technique, or progression of COPD or comorbid conditions.  CAT MCID is 2 points  mMRC: mMRC (Modified Medical Research Council) Dyspnea Scale is used to assess the degree of baseline functional disability in patients of respiratory disease due to dyspnea. No minimal important difference is established. A decrease in score of 1 point or greater is considered a positive change.   Pulmonary Function Assessment:  Pulmonary Function Assessment - 04/04/23 0922       Breath   Shortness of Breath Yes;Limiting activity             Exercise Target Goals: Exercise Program Goal: Individual exercise prescription set using results from initial 6 min walk test and THRR while considering  patient's activity  barriers and safety.   Exercise Prescription Goal: Initial exercise prescription builds to 30-45 minutes a day of aerobic activity, 2-3 days per week.  Home exercise guidelines will be given to patient during program as part of exercise prescription that the participant will acknowledge.  Activity Barriers & Risk Stratification:  Activity Barriers & Cardiac Risk Stratification - 04/04/23 0925       Activity Barriers & Cardiac Risk Stratification   Activity Barriers Deconditioning;Muscular Weakness;Shortness of Breath;History of Falls;Balance Concerns             6  Minute Walk:  6 Minute Walk     Row Name 04/04/23 1011         6 Minute Walk   Phase Initial     Distance 960 feet     Walk Time 6 minutes     # of Rest Breaks 0     MPH 1.82     METS 2.54     RPE 11     Perceived Dyspnea  1     VO2 Peak 8.9     Symptoms No     Resting HR 100 bpm     Resting BP 104/66     Resting Oxygen Saturation  100 %     Exercise Oxygen Saturation  during 6 min walk 97 %     Max Ex. HR 106 bpm     Max Ex. BP 102/62     2 Minute Post BP 110/64       Interval HR   1 Minute HR 100     2 Minute HR 101     3 Minute HR 106     4 Minute HR 100     5 Minute HR 98     6 Minute HR 98     2 Minute Post HR 95     Interval Heart Rate? Yes       Interval Oxygen   Interval Oxygen? Yes     Baseline Oxygen Saturation % 100 %     1 Minute Oxygen Saturation % 98 %     1 Minute Liters of Oxygen 0 L     2 Minute Oxygen Saturation % 97 %     2 Minute Liters of Oxygen 0 L     3 Minute Oxygen Saturation % 97 %     3 Minute Liters of Oxygen 0 L     4 Minute Oxygen Saturation % 97 %     4 Minute Liters of Oxygen 0 L     5 Minute Oxygen Saturation % 97 %     5 Minute Liters of Oxygen 0 L     6 Minute Oxygen Saturation % 97 %     6 Minute Liters of Oxygen 0 L     2 Minute Post Oxygen Saturation % 99 %     2 Minute Post Liters of Oxygen 0 L              Oxygen Initial Assessment:   Oxygen Initial Assessment - 04/04/23 0921       Home Oxygen   Home Oxygen Device None    Sleep Oxygen Prescription None    Home Exercise Oxygen Prescription None    Home Resting Oxygen Prescription None      Initial 6 min Walk   Oxygen Used None      Program Oxygen Prescription   Program Oxygen Prescription None      Intervention   Short Term Goals To learn and understand importance of maintaining oxygen saturations>88%;To learn and demonstrate proper use of respiratory medications;To learn and understand importance of monitoring SPO2 with pulse oximeter and demonstrate accurate use of the pulse oximeter.;To learn and demonstrate proper pursed lip breathing techniques or other breathing techniques.     Long  Term Goals Verbalizes importance of monitoring SPO2 with pulse oximeter and return demonstration;Maintenance of O2 saturations>88%;Exhibits proper breathing techniques, such as pursed lip breathing or other method taught during program session;Compliance with respiratory medication;Demonstrates proper use of MDI's  Oxygen Re-Evaluation:  Oxygen Re-Evaluation     Row Name 04/29/23 0855 05/28/23 1440           Program Oxygen Prescription   Program Oxygen Prescription None None        Home Oxygen   Home Oxygen Device None None      Sleep Oxygen Prescription None None      Home Exercise Oxygen Prescription None None      Home Resting Oxygen Prescription None None        Goals/Expected Outcomes   Short Term Goals To learn and understand importance of maintaining oxygen saturations>88%;To learn and demonstrate proper use of respiratory medications;To learn and understand importance of monitoring SPO2 with pulse oximeter and demonstrate accurate use of the pulse oximeter.;To learn and demonstrate proper pursed lip breathing techniques or other breathing techniques.  To learn and understand importance of maintaining oxygen saturations>88%;To learn and demonstrate  proper use of respiratory medications;To learn and understand importance of monitoring SPO2 with pulse oximeter and demonstrate accurate use of the pulse oximeter.;To learn and demonstrate proper pursed lip breathing techniques or other breathing techniques.       Long  Term Goals Verbalizes importance of monitoring SPO2 with pulse oximeter and return demonstration;Maintenance of O2 saturations>88%;Exhibits proper breathing techniques, such as pursed lip breathing or other method taught during program session;Compliance with respiratory medication;Demonstrates proper use of MDI's Verbalizes importance of monitoring SPO2 with pulse oximeter and return demonstration;Maintenance of O2 saturations>88%;Exhibits proper breathing techniques, such as pursed lip breathing or other method taught during program session;Compliance with respiratory medication;Demonstrates proper use of MDI's      Goals/Expected Outcomes Compliance and understanding of oxygen saturation monitoring and breath techniques to decrease shortness of breath. Compliance and understanding of oxygen saturation monitoring and breath techniques to decrease shortness of breath.               Oxygen Discharge (Final Oxygen Re-Evaluation):  Oxygen Re-Evaluation - 05/28/23 1440       Program Oxygen Prescription   Program Oxygen Prescription None      Home Oxygen   Home Oxygen Device None    Sleep Oxygen Prescription None    Home Exercise Oxygen Prescription None    Home Resting Oxygen Prescription None      Goals/Expected Outcomes   Short Term Goals To learn and understand importance of maintaining oxygen saturations>88%;To learn and demonstrate proper use of respiratory medications;To learn and understand importance of monitoring SPO2 with pulse oximeter and demonstrate accurate use of the pulse oximeter.;To learn and demonstrate proper pursed lip breathing techniques or other breathing techniques.     Long  Term Goals Verbalizes  importance of monitoring SPO2 with pulse oximeter and return demonstration;Maintenance of O2 saturations>88%;Exhibits proper breathing techniques, such as pursed lip breathing or other method taught during program session;Compliance with respiratory medication;Demonstrates proper use of MDI's    Goals/Expected Outcomes Compliance and understanding of oxygen saturation monitoring and breath techniques to decrease shortness of breath.             Initial Exercise Prescription:  Initial Exercise Prescription - 04/04/23 1000       Date of Initial Exercise RX and Referring Provider   Date 04/04/23    Referring Provider Vivia Budge   Expected Discharge Date 07/01/23      Recumbant Bike   Level 1    RPM 63    Watts 38    Minutes 15    METs 2  NuStep   Level 1    SPM 79    Minutes 15    METs 2.1      Prescription Details   Frequency (times per week) 2    Duration Progress to 30 minutes of continuous aerobic without signs/symptoms of physical distress      Intensity   THRR 40-80% of Max Heartrate 59-118    Ratings of Perceived Exertion 11-13    Perceived Dyspnea 0-4      Progression   Progression Continue to progress workloads to maintain intensity without signs/symptoms of physical distress.      Resistance Training   Training Prescription Yes    Weight blue bands    Reps 10-15             Perform Capillary Blood Glucose checks as needed.  Exercise Prescription Changes:   Exercise Prescription Changes     Row Name 04/08/23 1200 04/22/23 1100 04/22/23 1500 05/01/23 1500 05/20/23 1500     Response to Exercise   Blood Pressure (Admit) 98/58 112/62 110/64 94/60 112/58   Blood Pressure (Exercise) 100/62 -- 122/60 -- 126/60   Blood Pressure (Exit) 104/60 114/62 98/60 92/66  116/60   Heart Rate (Admit) 95 bpm 96 bpm 95 bpm 100 bpm 97 bpm   Heart Rate (Exercise) 106 bpm 104 bpm 107 bpm 123 bpm 113 bpm   Heart Rate (Exit) 111 bpm 100 bpm 105 bpm 113 bpm 108  bpm   Oxygen Saturation (Admit) 98 % 98 % 99 % 98 % 97 %   Oxygen Saturation (Exercise) 98 % 98 % 98 % 98 % 95 %   Oxygen Saturation (Exit) 98 % 99 % 99 % 98 % 97 %   Rating of Perceived Exertion (Exercise) 11 11 11 11 14    Perceived Dyspnea (Exercise) 1 1 1 1 1    Duration Progress to 30 minutes of  aerobic without signs/symptoms of physical distress Continue with 30 min of aerobic exercise without signs/symptoms of physical distress. Continue with 30 min of aerobic exercise without signs/symptoms of physical distress. Continue with 30 min of aerobic exercise without signs/symptoms of physical distress. Continue with 30 min of aerobic exercise without signs/symptoms of physical distress.   Intensity THRR unchanged THRR unchanged THRR unchanged THRR unchanged THRR unchanged     Progression   Progression Continue to progress workloads to maintain intensity without signs/symptoms of physical distress. Continue to progress workloads to maintain intensity without signs/symptoms of physical distress. Continue to progress workloads to maintain intensity without signs/symptoms of physical distress. Continue to progress workloads to maintain intensity without signs/symptoms of physical distress. Continue to progress workloads to maintain intensity without signs/symptoms of physical distress.     Resistance Training   Training Prescription Yes Yes Yes Yes Yes   Weight blue bands blue bands blue bands blue bands blue bands   Reps 10-15 10-15 10-15 10-15 10-15   Time 10 Minutes 10 Minutes 10 Minutes 10 Minutes 10 Minutes     Recumbant Bike   Level 1 3 3 3 3    RPM 46 45 -- -- 33   Watts 12 25 -- -- 24   Minutes 15 15 15 15 15    METs 1.9 2.5 2.8 2.8 2.7     NuStep   Level 1 3 3 3 4    SPM 80 81 82 -- 87   Minutes 15 15 15 15 15    METs 2 2.3 2.4 2.2 2.7     Home Exercise Plan  Plans to continue exercise at -- -- -- Home (comment)  walking or norditrack --   Frequency -- -- -- Add 1 additional day  to program exercise sessions. --   Initial Home Exercises Provided -- -- -- 05/01/23 --    Row Name 06/03/23 1500             Response to Exercise   Blood Pressure (Admit) 104/60       Blood Pressure (Exercise) 122/62       Blood Pressure (Exit) 96/60       Heart Rate (Admit) 98 bpm       Heart Rate (Exercise) 97 bpm       Heart Rate (Exit) 115 bpm       Oxygen Saturation (Admit) 98 %       Oxygen Saturation (Exercise) 98 %       Oxygen Saturation (Exit) 97 %       Rating of Perceived Exertion (Exercise) 11       Perceived Dyspnea (Exercise) 1       Duration Continue with 30 min of aerobic exercise without signs/symptoms of physical distress.       Intensity THRR unchanged         Progression   Progression Continue to progress workloads to maintain intensity without signs/symptoms of physical distress.         Resistance Training   Training Prescription Yes       Weight blue bands       Reps 10-15       Time 10 Minutes         Recumbant Bike   Level 4       RPM 78       Minutes 15       METs 2.6         NuStep   Level 5       Minutes 15       METs 2.9                Exercise Comments:   Exercise Comments     Row Name 04/08/23 1216 05/01/23 1515         Exercise Comments Pt completed first day of group exercise. He exercised on the recumbent bike for 15 min, level 1, METs 1.9. He then exercised on the recumbent stepper for 15 min, level 1, 2.0 METs. Pt tolerated well, able to perform warm up and cool down standing, including squats. Discussed METs with decent reception. Discussed with pt home exercise plan. He is currently active but not exercising. He has a nordictrack that he can do for 2-3 min. We discussed increasing time as able or walking in his house or store. Encouraged increasing time to 30 min 1-2 days a week. Pt does state that he feels he gets enough exercise with his activity. We discussed the difference. Gave pt information on home exercise  programs. He is not interested in a facility.               Exercise Goals and Review:   Exercise Goals     Row Name 04/04/23 7414             Exercise Goals   Increase Physical Activity Yes       Intervention Provide advice, education, support and counseling about physical activity/exercise needs.;Develop an individualized exercise prescription for aerobic and resistive training based on initial evaluation findings, risk stratification, comorbidities and participant's personal goals.  Expected Outcomes Short Term: Attend rehab on a regular basis to increase amount of physical activity.;Long Term: Exercising regularly at least 3-5 days a week.;Long Term: Add in home exercise to make exercise part of routine and to increase amount of physical activity.       Increase Strength and Stamina Yes       Intervention Provide advice, education, support and counseling about physical activity/exercise needs.;Develop an individualized exercise prescription for aerobic and resistive training based on initial evaluation findings, risk stratification, comorbidities and participant's personal goals.       Expected Outcomes Short Term: Increase workloads from initial exercise prescription for resistance, speed, and METs.;Short Term: Perform resistance training exercises routinely during rehab and add in resistance training at home;Long Term: Improve cardiorespiratory fitness, muscular endurance and strength as measured by increased METs and functional capacity ( )       Able to understand and use rate of perceived exertion (RPE) scale Yes       Intervention Provide education and explanation on how to use RPE scale       Expected Outcomes Short Term: Able to use RPE daily in rehab to express subjective intensity level;Long Term:  Able to use RPE to guide intensity level when exercising independently       Able to understand and use Dyspnea scale Yes       Intervention Provide education and  explanation on how to use Dyspnea scale       Expected Outcomes Short Term: Able to use Dyspnea scale daily in rehab to express subjective sense of shortness of breath during exertion;Long Term: Able to use Dyspnea scale to guide intensity level when exercising independently       Knowledge and understanding of Target Heart Rate Range (THRR) Yes       Intervention Provide education and explanation of THRR including how the numbers were predicted and where they are located for reference       Expected Outcomes Short Term: Able to state/look up THRR;Long Term: Able to use THRR to govern intensity when exercising independently;Short Term: Able to use daily as guideline for intensity in rehab       Understanding of Exercise Prescription Yes       Intervention Provide education, explanation, and written materials on patient's individual exercise prescription       Expected Outcomes Short Term: Able to explain program exercise prescription;Long Term: Able to explain home exercise prescription to exercise independently                Exercise Goals Re-Evaluation :  Exercise Goals Re-Evaluation     Row Name 04/04/23 7829 04/29/23 0852 05/28/23 1440         Exercise Goal Re-Evaluation   Exercise Goals Review Increase Physical Activity;Able to understand and use Dyspnea scale;Understanding of Exercise Prescription;Increase Strength and Stamina;Knowledge and understanding of Target Heart Rate Range (THRR);Able to understand and use rate of perceived exertion (RPE) scale Increase Physical Activity;Able to understand and use Dyspnea scale;Understanding of Exercise Prescription;Increase Strength and Stamina;Knowledge and understanding of Target Heart Rate Range (THRR);Able to understand and use rate of perceived exertion (RPE) scale Increase Physical Activity;Able to understand and use Dyspnea scale;Understanding of Exercise Prescription;Increase Strength and Stamina;Knowledge and understanding of Target  Heart Rate Range (THRR);Able to understand and use rate of perceived exertion (RPE) scale     Comments Nathan Palmer is scheduled to begin exercise on 12/24. Pt has completed 5 days of group exercise, missing 1 for an appointment. He  is exercising on the recumbent bike for 15 min, level 3, METs  2.8 with good progression. He then is exercising on the recumbent stepper for 15 min, level 3, 2.4 METs. He is using blue bands currently, 5.8 lbs. He performs warm up and cool down without limitations, including squats. Will progress as tolerated. Pt has completed 12 days of group exercise, missing 2 for appointments. He is exercising on the recumbent bike for 15 min, level 3, METs  2.9. He then is exercising on the recumbent stepper for 15 min, level 4, 2.7 METs. He is using blue bands currently, 5.8 lbs. He performs warm up and cool down without limitations, including squats. His METs have plateaued therefore will attempt to increase level.     Expected Outcomes Through exercise in rehab and at home, the patient will decrease shortness of breath with daily activities and feel confident in doing an exercise regimen at home. Through exercise in rehab and at home, the patient will decrease shortness of breath with daily activities and feel confident in doing an exercise regimen at home. Through exercise in rehab and at home, the patient will decrease shortness of breath with daily activities and feel confident in doing an exercise regimen at home.              Discharge Exercise Prescription (Final Exercise Prescription Changes):  Exercise Prescription Changes - 06/03/23 1500       Response to Exercise   Blood Pressure (Admit) 104/60    Blood Pressure (Exercise) 122/62    Blood Pressure (Exit) 96/60    Heart Rate (Admit) 98 bpm    Heart Rate (Exercise) 97 bpm    Heart Rate (Exit) 115 bpm    Oxygen Saturation (Admit) 98 %    Oxygen Saturation (Exercise) 98 %    Oxygen Saturation (Exit) 97 %    Rating of  Perceived Exertion (Exercise) 11    Perceived Dyspnea (Exercise) 1    Duration Continue with 30 min of aerobic exercise without signs/symptoms of physical distress.    Intensity THRR unchanged      Progression   Progression Continue to progress workloads to maintain intensity without signs/symptoms of physical distress.      Resistance Training   Training Prescription Yes    Weight blue bands    Reps 10-15    Time 10 Minutes      Recumbant Bike   Level 4    RPM 78    Minutes 15    METs 2.6      NuStep   Level 5    Minutes 15    METs 2.9             Nutrition:  Target Goals: Understanding of nutrition guidelines, daily intake of sodium 1500mg , cholesterol 200mg , calories 30% from fat and 7% or less from saturated fats, daily to have 5 or more servings of fruits and vegetables.  Biometrics:   Renee Ramus - 04/04/23 0909        Post  Biometrics   Grip Strength 22 kg             Nutrition Therapy Plan and Nutrition Goals:   Nutrition Assessments:  MEDIFICTS Score Key: >=70 Need to make dietary changes  40-70 Heart Healthy Diet <= 40 Therapeutic Level Cholesterol Diet   Picture Your Plate Scores: <33 Unhealthy dietary pattern with much room for improvement. 41-50 Dietary pattern unlikely to meet recommendations for good health and room for improvement. 51-60 More  healthful dietary pattern, with some room for improvement.  >60 Healthy dietary pattern, although there may be some specific behaviors that could be improved.    Nutrition Goals Re-Evaluation:   Nutrition Goals Discharge (Final Nutrition Goals Re-Evaluation):   Psychosocial: Target Goals: Acknowledge presence or absence of significant depression and/or stress, maximize coping skills, provide positive support system. Participant is able to verbalize types and ability to use techniques and skills needed for reducing stress and depression.  Initial Review & Psychosocial Screening:   Initial Psych Review & Screening - 04/04/23 0919       Initial Review   Current issues with None Identified      Family Dynamics   Good Support System? Yes    Comments Children      Barriers   Psychosocial barriers to participate in program There are no identifiable barriers or psychosocial needs.      Screening Interventions   Interventions Encouraged to exercise             Quality of Life Scores:  Scores of 19 and below usually indicate a poorer quality of life in these areas.  A difference of  2-3 points is a clinically meaningful difference.  A difference of 2-3 points in the total score of the Quality of Life Index has been associated with significant improvement in overall quality of life, self-image, physical symptoms, and general health in studies assessing change in quality of life.  PHQ-9: Review Flowsheet       04/04/2023  Depression screen PHQ 2/9  Decreased Interest 0  Down, Depressed, Hopeless 0  PHQ - 2 Score 0  Altered sleeping 0  Tired, decreased energy 0  Change in appetite 0  Feeling bad or failure about yourself  0  Trouble concentrating 0  Moving slowly or fidgety/restless 0  Suicidal thoughts 0  PHQ-9 Score 0   Interpretation of Total Score  Total Score Depression Severity:  1-4 = Minimal depression, 5-9 = Mild depression, 10-14 = Moderate depression, 15-19 = Moderately severe depression, 20-27 = Severe depression   Psychosocial Evaluation and Intervention:  Psychosocial Evaluation - 04/04/23 0919       Psychosocial Evaluation & Interventions   Interventions Encouraged to exercise with the program and follow exercise prescription    Comments Pt denies any psychosocial barrieres at this time. Will continue to monitor.    Expected Outcomes For Nathan Palmer to participate in rehab free of psychosocial concerns.    Continue Psychosocial Services  No Follow up required             Psychosocial Re-Evaluation:  Psychosocial Re-Evaluation      Row Name 04/07/23 0946 04/28/23 0840 05/28/23 1216         Psychosocial Re-Evaluation   Current issues with None Identified None Identified None Identified     Comments No changes since orientation. Nathan Palmer is scheduled to start the program on 04/08/23 At 30-day re-evaluation, Nathan Palmer still denies any psy/soc issues or barriers At 30-day re-evaluation, Nathan Palmer still denies any psy/soc issues or barriers     Expected Outcomes For Nathan Palmer to participate in rehab free of psychosocial concerns. For Nathan Palmer to participate in rehab free of psychosocial concerns. For Nathan Palmer to participate in rehab free of psychosocial concerns.     Interventions Encouraged to attend Pulmonary Rehabilitation for the exercise Encouraged to attend Pulmonary Rehabilitation for the exercise Encouraged to attend Pulmonary Rehabilitation for the exercise     Continue Psychosocial Services  No Follow up required No Follow  up required No Follow up required              Psychosocial Discharge (Final Psychosocial Re-Evaluation):  Psychosocial Re-Evaluation - 05/28/23 1216       Psychosocial Re-Evaluation   Current issues with None Identified    Comments At 30-day re-evaluation, Nathan Palmer still denies any psy/soc issues or barriers    Expected Outcomes For Nathan Palmer to participate in rehab free of psychosocial concerns.    Interventions Encouraged to attend Pulmonary Rehabilitation for the exercise    Continue Psychosocial Services  No Follow up required             Education: Education Goals: Education classes will be provided on a weekly basis, covering required topics. Participant will state understanding/return demonstration of topics presented.  Learning Barriers/Preferences:  Learning Barriers/Preferences - 04/04/23 1610       Learning Barriers/Preferences   Learning Barriers Sight    Learning Preferences None             Education Topics: Know Your Numbers Group instruction that is supported by a  PowerPoint presentation. Instructor discusses importance of knowing and understanding resting, exercise, and post-exercise oxygen saturation, heart rate, and blood pressure. Oxygen saturation, heart rate, blood pressure, rating of perceived exertion, and dyspnea are reviewed along with a normal range for these values.  Flowsheet Row PULMONARY REHAB CHRONIC OBSTRUCTIVE PULMONARY DISEASE from 04/17/2023 in [facility]  Date 04/17/23  Educator EP  Instruction Review Code 1- Verbalizes Understanding       Exercise for the Pulmonary Patient Group instruction that is supported by a PowerPoint presentation. Instructor discusses benefits of exercise, core components of exercise, frequency, duration, and intensity of an exercise routine, importance of utilizing pulse oximetry during exercise, safety while exercising, and options of places to exercise outside of rehab.  Flowsheet Row PULMONARY REHAB CHRONIC OBSTRUCTIVE PULMONARY DISEASE from 04/10/2023 in [facility]  Date 04/10/23  Educator EP  Instruction Review Code 1- Verbalizes Understanding       MET Level  Group instruction provided by PowerPoint, verbal discussion, and written material to support subject matter. Instructor reviews what METs are and how to increase METs.    Pulmonary Medications Verbally interactive group education provided by instructor with focus on inhaled medications and proper administration.   Anatomy and Physiology of the Respiratory System Group instruction provided by PowerPoint, verbal discussion, and written material to support subject matter. Instructor reviews respiratory cycle and anatomical components of the respiratory system and their functions. Instructor also reviews differences in obstructive and restrictive respiratory diseases with examples of each.    Oxygen Safety Group instruction  provided by PowerPoint, verbal discussion, and written material to support subject matter. There is an overview of "What is Oxygen" and "Why do we need it".  Instructor also reviews how to create a safe environment for oxygen use, the importance of using oxygen as prescribed, and the risks of noncompliance. There is a brief discussion on traveling with oxygen and resources the patient may utilize.   Oxygen Use Group instruction provided by PowerPoint, verbal discussion, and written material to discuss how supplemental oxygen is prescribed and different types of oxygen supply systems. Resources for more information are provided.  Flowsheet Row PULMONARY REHAB CHRONIC OBSTRUCTIVE PULMONARY DISEASE from 05/01/2023 in [facility]  Date 05/01/23  Educator RT  Instruction Review  Code 1- Verbalizes Understanding       Breathing Techniques Group instruction that is supported by demonstration and informational handouts. Instructor discusses the benefits of pursed lip and diaphragmatic breathing and detailed demonstration on how to perform both.  Flowsheet Row PULMONARY REHAB CHRONIC OBSTRUCTIVE PULMONARY DISEASE from 05/08/2023 in [facility], [facility]  Date 05/08/23  Educator RN  Instruction Review Code 1- Verbalizes Understanding        Risk Factor Reduction Group instruction that is supported by a PowerPoint presentation. Instructor discusses the definition of a risk factor, different risk factors for pulmonary disease, and how the heart and lungs work together. Flowsheet Row PULMONARY REHAB CHRONIC OBSTRUCTIVE PULMONARY DISEASE from 05/29/2023 in [facility]  Date 05/29/23  Educator EP  Instruction Review Code 1- Verbalizes Understanding       Pulmonary Diseases Group instruction provided by PowerPoint, verbal discussion, and  written material to support subject matter. Instructor gives an overview of the different type of pulmonary diseases. There is also a discussion on risk factors and symptoms as well as ways to manage the diseases.   Stress and Energy Conservation Group instruction provided by PowerPoint, verbal discussion, and written material to support subject matter. Instructor gives an overview of stress and the impact it can have on the body. Instructor also reviews ways to reduce stress. There is also a discussion on energy conservation and ways to conserve energy throughout the day. Flowsheet Row PULMONARY REHAB CHRONIC OBSTRUCTIVE PULMONARY DISEASE from 05/15/2023 in [facility], [facility]  Date 05/15/23  Educator RN  Instruction Review Code 1- Verbalizes Understanding       Warning Signs and Symptoms Group instruction provided by PowerPoint, verbal discussion, and written material to support subject matter. Instructor reviews warning signs and symptoms of stroke, heart attack, cold and flu. Instructor also reviews ways to prevent the spread of infection. Flowsheet Row PULMONARY REHAB CHRONIC OBSTRUCTIVE PULMONARY DISEASE from 05/22/2023 in [facility], Vascular, [facility]  Date 05/22/23  Educator RN  Instruction Review Code 1- Verbalizes Understanding       Other Education Group or individual verbal, written, or video instructions that support the educational goals of the pulmonary rehab program.    Knowledge Questionnaire Score:  Knowledge Questionnaire Score - 04/04/23 0940       Knowledge Questionnaire Score   Pre Score 12/18             Core Components/Risk Factors/Patient Goals at Admission:  Personal Goals and Risk Factors at Admission - 04/04/23 0921       Core Components/Risk Factors/Patient Goals on Admission   Tobacco Cessation Yes    Number of packs per day 2-3 cigs/day    Intervention  Assist the participant in steps to quit. Provide individualized education and counseling about committing to Tobacco Cessation, relapse prevention, and pharmacological support that can be provided by physician.;Education officer, environmental, assist with locating and accessing local/national Quit Smoking programs, and support quit date choice.    Expected Outcomes Short Term: Will demonstrate readiness to quit, by selecting a quit date.;Short Term: Will quit all tobacco product use, adhering to prevention of relapse plan.;Long Term: Complete abstinence from all tobacco products for at least 12 months from quit date.    Improve shortness of breath with ADL's Yes    Intervention Provide education, individualized exercise  plan and daily activity instruction to help decrease symptoms of SOB with activities of daily living.    Expected Outcomes Short Term: Improve cardiorespiratory fitness to achieve a reduction of symptoms when performing ADLs;Long Term: Be able to perform more ADLs without symptoms or delay the onset of symptoms             Core Components/Risk Factors/Patient Goals Review:   Goals and Risk Factor Review     Row Name 04/07/23 0947 04/15/23 1519 04/28/23 0843 04/29/23 1512 05/28/23 1218     Core Components/Risk Factors/Patient Goals Review   Personal Goals Review Improve shortness of breath with ADL's;Develop more efficient breathing techniques such as purse lipped breathing and diaphragmatic breathing and practicing self-pacing with activity.;Tobacco Cessation Tobacco Cessation Improve shortness of breath with ADL's;Develop more efficient breathing techniques such as purse lipped breathing and diaphragmatic breathing and practicing self-pacing with activity.;Tobacco Cessation Tobacco Cessation Improve shortness of breath with ADL's;Tobacco Cessation   Review Unable to assess goals. Nathan Palmer is scheduled to start the program on 04/08/23 Nathan Palmer is currently smoking a total of 6 cigarettes  a day. He is struggling to quit however he wants to quit. Discussed tips and methods. He is contemplative on a quit date. Gave resources for him to review. Will f/u. At 30-day re-evaluation for core components/risk factors/patient goals review are as follows: Goal met on developing more efficient breathing techniques such as purse lipped breathing and diaphragmatic breathing; and practicing self-pacing with activity. Nathan Palmer is able to initiate PLB on his own with exertion. He also knows how to slow down and pace himself when he is short of breath. Goal in progress on improving his shortness of breath with ADLs. He has increased both his workload and METs while maintaining his oxygen saturation >88% on room air. Goal in action for tobacco cessation. Nathan Palmer is down to smoking 6 cigarettes/day. We are continually working with Nathan Palmer on a plan to quit. Resources and support have been provided. We will continue to monitor and assess Nathan Palmer's progress in the program. He will continue to benefit from [facility] for nutrition, education, exercise, and lifestyle modification. Spoke with pt regarding his readiness for tobacco cessation. Pt feels like he is making progress with decreasing the amount he is smoking. He reports he is only smoking 1/2 of the cigarette and putting it out until later. Discussed with pt looking for other ways to decrease ie waiting 5 min when he has a craving or distracting himself. Encouraged pt to set short term and long term quit dates. He agreed but is still contemplative on a firm date. He sts he is taking "pills" from the [place] for smoking cessation but he has not seen a difference in his desire to smoke. At 30-day re-evaluation for core components/risk factors/patient goals review are as follows: Goal in progress on improving his shortness of breath with ADLs. He has increased both his workload and METs while maintaining his oxygen saturation >88% on room air. Goal in action for tobacco cessation. Nathan Palmer  is down to smoking 1 cigarette/day. We are continually working with Nathan Palmer on a plan to quit. Resources and support have been provided. We will continue to monitor and assess Nathan Palmer's progress in the program. He will continue to benefit from [facility] for nutrition, education, exercise, and lifestyle modification.   Expected Outcomes For Nathan Palmer to develop more efficient breathing techniques such as purse lipped breathing and diaphragmatic breathing, and self pacing with activity. For Nathan Palmer to improve his shortness of breath with  ADLs and to stop smoking For pt to set a quit date. For Nathan Palmer to develop more efficient breathing techniques such as purse lipped breathing and diaphragmatic breathing, and self pacing with activity. For Nathan Palmer to improve his shortness of breath with ADLs and to stop smoking For pt to set a quit date. For Nathan Palmer to develop more efficient breathing techniques such as purse lipped breathing and diaphragmatic breathing, and self pacing with activity. For Nathan Palmer to improve his shortness of breath with ADLs and to stop smoking            Core Components/Risk Factors/Patient Goals at Discharge (Final Review):   Goals and Risk Factor Review - 05/28/23 1218       Core Components/Risk Factors/Patient Goals Review   Personal Goals Review Improve shortness of breath with ADL's;Tobacco Cessation    Review At 30-day re-evaluation for core components/risk factors/patient goals review are as follows: Goal in progress on improving his shortness of breath with ADLs. He has increased both his workload and METs while maintaining his oxygen saturation >88% on room air. Goal in action for tobacco cessation. Nathan Palmer is down to smoking 1 cigarette/day. We are continually working with Nathan Palmer on a plan to quit. Resources and support have been provided. We will continue to monitor and assess Nathan Palmer's progress in the program. He will continue to benefit from [facility] for nutrition, education, exercise, and  lifestyle modification.    Expected Outcomes For Nathan Palmer to develop more efficient breathing techniques such as purse lipped breathing and diaphragmatic breathing, and self pacing with activity. For Nathan Palmer to improve his shortness of breath with ADLs and to stop smoking             ITP Comments: Pt is making expected progress toward Pulmonary Rehab goals after completing 14 session(s). Recommend continued exercise, life style modification, education, and utilization of breathing techniques to increase stamina and strength, while also decreasing shortness of breath with exertion.  Dr. Mechele Collin is Medical Director for Pulmonary Rehab at [facility].

## 2023-06-05 ENCOUNTER — Encounter (HOSPITAL_COMMUNITY): Payer: No Typology Code available for payment source

## 2023-06-09 ENCOUNTER — Telehealth (HOSPITAL_COMMUNITY): Payer: Self-pay | Admitting: *Deleted

## 2023-06-09 NOTE — Telephone Encounter (Signed)
 Due to restrictions post cataract surgery for 1-week ( no straining). Will not be attending Pulmonary Rehab Tuesday or Thursday this week Plans to return next Tuesday.

## 2023-06-10 ENCOUNTER — Telehealth (HOSPITAL_COMMUNITY): Payer: Self-pay

## 2023-06-10 ENCOUNTER — Encounter (HOSPITAL_COMMUNITY): Payer: No Typology Code available for payment source

## 2023-06-10 NOTE — Telephone Encounter (Signed)
 Pt called and stated he will be out until after 3/10 due to having eye surgery. Adv pt he will need clearance before returning to [facility] and someone from [facility] will follow up with him.

## 2023-06-12 ENCOUNTER — Encounter (HOSPITAL_COMMUNITY): Payer: No Typology Code available for payment source

## 2023-06-17 ENCOUNTER — Encounter (HOSPITAL_COMMUNITY): Payer: No Typology Code available for payment source

## 2023-06-19 ENCOUNTER — Encounter (HOSPITAL_COMMUNITY): Payer: No Typology Code available for payment source

## 2023-06-24 ENCOUNTER — Encounter (HOSPITAL_COMMUNITY): Payer: No Typology Code available for payment source

## 2023-06-25 ENCOUNTER — Telehealth (HOSPITAL_COMMUNITY): Payer: Self-pay

## 2023-06-25 NOTE — Progress Notes (Signed)
 Discharge Progress Report  Patient Details  Name: Nathan Icard Sr. MRN: [id number] Date of Birth: 10/17/1950 Referring Provider:   Doristine Devoid [facility] Walk Test from 04/04/2023 in [facility]  Referring Provider [name]        Number of Visits: 14  Reason for Discharge:  Early Exit:     Smoking History:  Social History   Tobacco Use  Smoking Status Every Day   Current packs/day: 0.00   Average packs/day: 0.3 packs/day for 40.0 years (10.0 ttl pk-yrs)   Types: Cigarettes   Start date: 12/04/1977   Last attempt to quit: 12/04/2017   Years since quitting: 5.5  Smokeless Tobacco Never  Tobacco Comments   Smokes 3-4 cigs/ day    Diagnosis:  Chronic obstructive pulmonary disease, unspecified COPD type (HCC)  ADL UCSD:  Pulmonary Assessment Scores     Row Name 04/04/23 0922         ADL UCSD   ADL Phase Entry     SOB Score total 61       CAT Score   CAT Score 18       mMRC Score   mMRC Score 4              Initial Exercise Prescription:  Initial Exercise Prescription - 04/04/23 1000       Date of Initial Exercise RX and Referring Provider   Date 04/04/23    Referring Provider Vivia Budge   Expected Discharge Date 07/01/23      Recumbant Bike   Level 1    RPM 63    Watts 38    Minutes 15    METs 2      NuStep   Level 1    SPM 79    Minutes 15    METs 2.1      Prescription Details   Frequency (times per week) 2    Duration Progress to 30 minutes of continuous aerobic without signs/symptoms of physical distress      Intensity   THRR 40-80% of Max Heartrate 59-118    Ratings of Perceived Exertion 11-13    Perceived Dyspnea 0-4      Progression   Progression Continue to progress workloads to maintain intensity without signs/symptoms of physical distress.      Resistance Training   Training Prescription Yes    Weight blue bands    Reps 10-15              Discharge Exercise Prescription (Final Exercise Prescription Changes):  Exercise Prescription Changes - 06/03/23 1500       Response to Exercise   Blood Pressure (Admit) 104/60    Blood Pressure (Exercise) 122/62    Blood Pressure (Exit) 96/60    Heart Rate (Admit) 98 bpm    Heart Rate (Exercise) 97 bpm    Heart Rate (Exit) 115 bpm    Oxygen Saturation (Admit) 98 %    Oxygen Saturation (Exercise) 98 %    Oxygen Saturation (Exit) 97 %    Rating of Perceived Exertion (Exercise) 11    Perceived Dyspnea (Exercise) 1    Duration Continue with 30 min of aerobic exercise without signs/symptoms of physical distress.    Intensity THRR unchanged      Progression   Progression Continue to progress workloads to maintain intensity without signs/symptoms of physical distress.      Paramedic  Prescription Yes    Weight blue bands    Reps 10-15    Time 10 Minutes      Recumbant Bike   Level 4    RPM 78    Minutes 15    METs 2.6      NuStep   Level 5    Minutes 15    METs 2.9             Functional Capacity:  6 Minute Walk     Row Name 04/04/23 1011         6 Minute Walk   Phase Initial     Distance 960 feet     Walk Time 6 minutes     # of Rest Breaks 0     MPH 1.82     METS 2.54     RPE 11     Perceived Dyspnea  1     VO2 Peak 8.9     Symptoms No     Resting HR 100 bpm     Resting BP 104/66     Resting Oxygen Saturation  100 %     Exercise Oxygen Saturation  during 6 min walk 97 %     Max Ex. HR 106 bpm     Max Ex. BP 102/62     2 Minute Post BP 110/64       Interval HR   1 Minute HR 100     2 Minute HR 101     3 Minute HR 106     4 Minute HR 100     5 Minute HR 98     6 Minute HR 98     2 Minute Post HR 95     Interval Heart Rate? Yes       Interval Oxygen   Interval Oxygen? Yes     Baseline Oxygen Saturation % 100 %     1 Minute Oxygen Saturation % 98 %     1 Minute Liters of Oxygen 0 L     2 Minute Oxygen  Saturation % 97 %     2 Minute Liters of Oxygen 0 L     3 Minute Oxygen Saturation % 97 %     3 Minute Liters of Oxygen 0 L     4 Minute Oxygen Saturation % 97 %     4 Minute Liters of Oxygen 0 L     5 Minute Oxygen Saturation % 97 %     5 Minute Liters of Oxygen 0 L     6 Minute Oxygen Saturation % 97 %     6 Minute Liters of Oxygen 0 L     2 Minute Post Oxygen Saturation % 99 %     2 Minute Post Liters of Oxygen 0 L              Psychological, QOL, Others - Outcomes: PHQ 2/9:    04/04/2023    9:18 AM  Depression screen PHQ 2/9  Decreased Interest 0  Down, Depressed, Hopeless 0  PHQ - 2 Score 0  Altered sleeping 0  Tired, decreased energy 0  Change in appetite 0  Feeling bad or failure about yourself  0  Trouble concentrating 0  Moving slowly or fidgety/restless 0  Suicidal thoughts 0  PHQ-9 Score 0    Quality of Life:   Personal Goals: Goals established at orientation with interventions provided to work toward goal.  Personal Goals and  Risk Factors at Admission - 04/04/23 0921       Core Components/Risk Factors/Patient Goals on Admission   Tobacco Cessation Yes    Number of packs per day 2-3 cigs/day    Intervention Assist the participant in steps to quit. Provide individualized education and counseling about committing to Tobacco Cessation, relapse prevention, and pharmacological support that can be provided by physician.;Education officer, environmental, assist with locating and accessing local/national Quit Smoking programs, and support quit date choice.    Expected Outcomes Short Term: Will demonstrate readiness to quit, by selecting a quit date.;Short Term: Will quit all tobacco product use, adhering to prevention of relapse plan.;Long Term: Complete abstinence from all tobacco products for at least 12 months from quit date.    Improve shortness of breath with ADL's Yes    Intervention Provide education, individualized exercise plan and daily activity  instruction to help decrease symptoms of SOB with activities of daily living.    Expected Outcomes Short Term: Improve cardiorespiratory fitness to achieve a reduction of symptoms when performing ADLs;Long Term: Be able to perform more ADLs without symptoms or delay the onset of symptoms              Personal Goals Discharge:  Goals and Risk Factor Review     Row Name 04/07/23 0947 04/15/23 1519 04/28/23 0843 04/29/23 1512 05/28/23 1218     Core Components/Risk Factors/Patient Goals Review   Personal Goals Review Improve shortness of breath with ADL's;Develop more efficient breathing techniques such as purse lipped breathing and diaphragmatic breathing and practicing self-pacing with activity.;Tobacco Cessation Tobacco Cessation Improve shortness of breath with ADL's;Develop more efficient breathing techniques such as purse lipped breathing and diaphragmatic breathing and practicing self-pacing with activity.;Tobacco Cessation Tobacco Cessation Improve shortness of breath with ADL's;Tobacco Cessation   Review Unable to assess goals. Nathan Palmer is scheduled to start the program on 04/08/23 Nathan Palmer is currently smoking a total of 6 cigarettes a day. He is struggling to quit however he wants to quit. Discussed tips and methods. He is contemplative on a quit date. Gave resources for him to review. Will f/u. At 30-day re-evaluation for core components/risk factors/patient goals review are as follows: Goal met on developing more efficient breathing techniques such as purse lipped breathing and diaphragmatic breathing; and practicing self-pacing with activity. Nathan Palmer is able to initiate PLB on his own with exertion. He also knows how to slow down and pace himself when he is short of breath. Goal in progress on improving his shortness of breath with ADLs. He has increased both his workload and METs while maintaining his oxygen saturation >88% on room air. Goal in action for tobacco cessation. Nathan Palmer is down to  smoking 6 cigarettes/day. We are continually working with Nathan Palmer on a plan to quit. Resources and support have been provided. We will continue to monitor and assess Nathan Palmer's progress in the program. He will continue to benefit from PR for nutrition, education, exercise, and lifestyle modification. Spoke with pt regarding his readiness for tobacco cessation. Pt feels like he is making progress with decreasing the amount he is smoking. He reports he is only smoking 1/2 of the cigarette and putting it out until later. Discussed with pt looking for other ways to decrease ie waiting 5 min when he has a craving or distracting himself. Encouraged pt to set short term and long term quit dates. He agreed but is still contemplative on a firm date. He sts he is taking "pills" from the [place] for smoking  cessation but he has not seen a difference in his desire to smoke. At 30-day re-evaluation for core components/risk factors/patient goals review are as follows: Goal in progress on improving his shortness of breath with ADLs. He has increased both his workload and METs while maintaining his oxygen saturation >88% on room air. Goal in action for tobacco cessation. Nathan Palmer is down to smoking 1 cigarette/day. We are continually working with Nathan Palmer on a plan to quit. Resources and support have been provided. We will continue to monitor and assess Nathan Palmer's progress in the program. He will continue to benefit from PR for nutrition, education, exercise, and lifestyle modification.   Expected Outcomes For Nathan Palmer to develop more efficient breathing techniques such as purse lipped breathing and diaphragmatic breathing, and self pacing with activity. For Nathan Palmer to improve his shortness of breath with ADLs and to stop smoking For pt to set a quit date. For Nathan Palmer to develop more efficient breathing techniques such as purse lipped breathing and diaphragmatic breathing, and self pacing with activity. For Nathan Palmer to improve his shortness of breath  with ADLs and to stop smoking For pt to set a quit date. For Nathan Palmer to develop more efficient breathing techniques such as purse lipped breathing and diaphragmatic breathing, and self pacing with activity. For Nathan Palmer to improve his shortness of breath with ADLs and to stop smoking    Row Name 06/25/23 1414             Core Components/Risk Factors/Patient Goals Review   Personal Goals Review Improve shortness of breath with ADL's       Review Nathan Palmer was discharged from the PR program on 06/25/23 due to having eye surgeries. Unfortunaetly Nathan Palmer did not meet his goal for tobacco cessation. Goal was met for improving shortness of breath with ADL's.       Expected Outcomes Patient will continue to benefit from ongoing nutrition, exercise, and lifestyle modification                Exercise Goals and Review:  Exercise Goals     Row Name 04/04/23 9811             Exercise Goals   Increase Physical Activity Yes       Intervention Provide advice, education, support and counseling about physical activity/exercise needs.;Develop an individualized exercise prescription for aerobic and resistive training based on initial evaluation findings, risk stratification, comorbidities and participant's personal goals.       Expected Outcomes Short Term: Attend rehab on a regular basis to increase amount of physical activity.;Long Term: Exercising regularly at least 3-5 days a week.;Long Term: Add in home exercise to make exercise part of routine and to increase amount of physical activity.       Increase Strength and Stamina Yes       Intervention Provide advice, education, support and counseling about physical activity/exercise needs.;Develop an individualized exercise prescription for aerobic and resistive training based on initial evaluation findings, risk stratification, comorbidities and participant's personal goals.       Expected Outcomes Short Term: Increase workloads from initial exercise  prescription for resistance, speed, and METs.;Short Term: Perform resistance training exercises routinely during rehab and add in resistance training at home;Long Term: Improve cardiorespiratory fitness, muscular endurance and strength as measured by increased METs and functional capacity ( )       Able to understand and use rate of perceived exertion (RPE) scale Yes       Intervention Provide education and explanation on how  to use RPE scale       Expected Outcomes Short Term: Able to use RPE daily in rehab to express subjective intensity level;Long Term:  Able to use RPE to guide intensity level when exercising independently       Able to understand and use Dyspnea scale Yes       Intervention Provide education and explanation on how to use Dyspnea scale       Expected Outcomes Short Term: Able to use Dyspnea scale daily in rehab to express subjective sense of shortness of breath during exertion;Long Term: Able to use Dyspnea scale to guide intensity level when exercising independently       Knowledge and understanding of Target Heart Rate Range (THRR) Yes       Intervention Provide education and explanation of THRR including how the numbers were predicted and where they are located for reference       Expected Outcomes Short Term: Able to state/look up THRR;Long Term: Able to use THRR to govern intensity when exercising independently;Short Term: Able to use daily as guideline for intensity in rehab       Understanding of Exercise Prescription Yes       Intervention Provide education, explanation, and written materials on patient's individual exercise prescription       Expected Outcomes Short Term: Able to explain program exercise prescription;Long Term: Able to explain home exercise prescription to exercise independently                Exercise Goals Re-Evaluation:  Exercise Goals Re-Evaluation     Row Name 04/04/23 1610 04/29/23 0852 05/28/23 1440 06/25/23 1357       Exercise Goal  Re-Evaluation   Exercise Goals Review Increase Physical Activity;Able to understand and use Dyspnea scale;Understanding of Exercise Prescription;Increase Strength and Stamina;Knowledge and understanding of Target Heart Rate Range (THRR);Able to understand and use rate of perceived exertion (RPE) scale Increase Physical Activity;Able to understand and use Dyspnea scale;Understanding of Exercise Prescription;Increase Strength and Stamina;Knowledge and understanding of Target Heart Rate Range (THRR);Able to understand and use rate of perceived exertion (RPE) scale Increase Physical Activity;Able to understand and use Dyspnea scale;Understanding of Exercise Prescription;Increase Strength and Stamina;Knowledge and understanding of Target Heart Rate Range (THRR);Able to understand and use rate of perceived exertion (RPE) scale Increase Physical Activity;Able to understand and use Dyspnea scale;Understanding of Exercise Prescription;Increase Strength and Stamina;Knowledge and understanding of Target Heart Rate Range (THRR);Able to understand and use rate of perceived exertion (RPE) scale    Comments Nathan Palmer is scheduled to begin exercise on 12/24. Pt has completed 5 days of group exercise, missing 1 for an appointment. He is exercising on the recumbent bike for 15 min, level 3, METs  2.8 with good progression. He then is exercising on the recumbent stepper for 15 min, level 3, 2.4 METs. He is using blue bands currently, 5.8 lbs. He performs warm up and cool down without limitations, including squats. Will progress as tolerated. Pt has completed 12 days of group exercise, missing 2 for appointments. He is exercising on the recumbent bike for 15 min, level 3, METs  2.9. He then is exercising on the recumbent stepper for 15 min, level 4, 2.7 METs. He is using blue bands currently, 5.8 lbs. He performs warm up and cool down without limitations, including squats. His METs have plateaued therefore will attempt to increase  level. Pt completed 14 days of group exercise, missing 2 for appointments. He had cataract surgery and needed to be  out for a long duration therefore discharged. He exercised on the recumbent bike for 15 min, level 4, METs  2.6. He then is exercised on the recumbent stepper for 15 min, level 5, 2.8 METs. He used blue bands currently, 5.8 lbs. He was not able to do a 6 min walk test at discharge nor the SOB, CAT questionnaires. Encouraged pt to continue exercising.    Expected Outcomes Through exercise in rehab and at home, the patient will decrease shortness of breath with daily activities and feel confident in doing an exercise regimen at home. Through exercise in rehab and at home, the patient will decrease shortness of breath with daily activities and feel confident in doing an exercise regimen at home. Through exercise in rehab and at home, the patient will decrease shortness of breath with daily activities and feel confident in doing an exercise regimen at home. Through exercise in rehab and at home, the patient will decrease shortness of breath with daily activities and feel confident in doing an exercise regimen at home.             Nutrition & Weight - Outcomes:   Post Biometrics - 04/04/23 0909        Post  Biometrics   Grip Strength 22 kg             Nutrition:   Nutrition Discharge:   Education Questionnaire Score:  Knowledge Questionnaire Score - 04/04/23 0940       Knowledge Questionnaire Score   Pre Score 12/18             Goals reviewed with patient; copy given to patient.

## 2023-06-25 NOTE — Telephone Encounter (Signed)
 Called pt to check on him and discuss his return to class. Pt states he had cataract surgery on 06/23/23. He is due to graduate the PR program on 07/01/23, but will not be able to attend. We discussed going ahead and discharging from the program and he was in agreement to this.

## 2023-06-26 ENCOUNTER — Encounter (HOSPITAL_COMMUNITY): Payer: No Typology Code available for payment source

## 2023-07-01 ENCOUNTER — Encounter (HOSPITAL_COMMUNITY): Payer: No Typology Code available for payment source

## 2023-08-08 ENCOUNTER — Encounter: Payer: Self-pay | Admitting: Neurology

## 2023-08-15 ENCOUNTER — Other Ambulatory Visit: Payer: Self-pay

## 2023-08-15 DIAGNOSIS — R202 Paresthesia of skin: Secondary | ICD-10-CM

## 2023-08-21 ENCOUNTER — Ambulatory Visit (INDEPENDENT_AMBULATORY_CARE_PROVIDER_SITE_OTHER): Admitting: Neurology

## 2023-08-21 DIAGNOSIS — R202 Paresthesia of skin: Secondary | ICD-10-CM | POA: Diagnosis not present

## 2023-08-21 NOTE — Procedures (Signed)
 [facility]  [address] [place], [address]  [place], [place] [address] Tel: [phone] Fax: [phone] Test Date:  08/21/2023  Patient: Nathan Palmer, Sr. DOB: 07/12/1950 Physician: Reyna Cava, DO  Sex: Male Height: 5\' 8"  Ref Phys: Milderd Alken, MD  ID#: [id number]   Technician:    History: This is a 73 year old man referred for evaluation of bilateral feet paresthesias.  NCV & EMG Findings: Electrodiagnostic testing of the right lower extremity and additional studies of the left shows: Bilateral sural and superficial peroneal sensory responses are within normal limits. Bilateral peroneal and tibial motor responses are within normal limits. Bilateral tibial H reflex studies are within normal limits. There is no evidence of active or chronic motor axonal changes affecting any of the tested muscles.  Motor unit configuration and recruitment pattern is within normal limits.   Impression: This is a normal study of the lower extremities.  In particular, there is no evidence of a large fiber sensorimotor polyneuropathy or lumbosacral radiculopathy.   ___________________________ Reyna Cava, DO    Nerve Conduction Studies   Stim Site NR Peak (ms) Norm Peak (ms) O-P Amp (V) Norm O-P Amp  Left Sup Peroneal Anti Sensory (Ant Lat Mall)  32 C  12 cm    2.7 <4.6 7.0 >3  Right Sup Peroneal Anti Sensory (Ant Lat Mall)  32 C  12 cm    2.8 <4.6 7.5 >3  Left Sural Anti Sensory (Lat Mall)  32 C  Calf    2.9 <4.6 7.6 >3  Right Sural Anti Sensory (Lat Mall)  32 C  Calf    2.7 <4.6 9.9 >3     Stim Site NR Onset (ms) Norm Onset (ms) O-P Amp (mV) Norm O-P Amp Site1 Site2 Delta-0 (ms) Dist (cm) Vel (m/s) Norm Vel (m/s)  Left Peroneal Motor (Ext Dig Brev)  32 C  Ankle    4.2 <6.0 3.3 >2.5 B Fib Ankle 8.9 37.0 42 >40  B Fib    13.1  2.5  Poplt B Fib 2.2 9.0 41 >40  Poplt    15.3  2.3         Right Peroneal Motor (Ext Dig Brev)  32 C  Ankle    3.3 <6.0 2.7 >2.5 B Fib  Ankle 9.0 37.0 41 >40  B Fib    12.3  2.1  Poplt B Fib 2.0 8.0 40 >40  Poplt    14.3  1.8         Left Tibial Motor (Abd Hall Brev)  32 C  Ankle    3.4 <6.0 11.5 >4 Knee Ankle 9.9 42.0 42 >40  Knee    13.3  8.1         Right Tibial Motor (Abd Hall Brev)  32 C  Ankle    3.8 <6.0 12.5 >4 Knee Ankle 9.4 42.0 45 >40  Knee    13.2  8.2          Electromyography   Side Muscle Ins.Act Fibs Fasc Recrt Amp Dur Poly Activation Comment  Right AntTibialis Nml Nml Nml Nml Nml Nml Nml Nml N/A  Right Gastroc Nml Nml Nml Nml Nml Nml Nml Nml N/A  Right Flex Dig Long Nml Nml Nml Nml Nml Nml Nml Nml N/A  Right RectFemoris Nml Nml Nml Nml Nml Nml Nml Nml N/A  Right BicepsFemS Nml Nml Nml Nml Nml Nml Nml Nml N/A  Left AntTibialis Nml Nml Nml Nml Nml Nml Nml Nml N/A  Left  Gastroc Nml Nml Nml Nml Nml Nml Nml Nml N/A  Left Flex Dig Long Nml Nml Nml Nml Nml Nml Nml Nml N/A  Left BicepsFemS Nml Nml Nml Nml Nml Nml Nml Nml N/A  Left RectFemoris Nml Nml Nml Nml Nml Nml Nml Nml N/A      Waveforms:

## 2023-09-18 ENCOUNTER — Encounter: Admitting: Neurology

## 2024-01-20 ENCOUNTER — Encounter: Payer: Self-pay | Admitting: Cardiovascular Disease

## 2024-01-20 ENCOUNTER — Ambulatory Visit: Attending: Cardiovascular Disease | Admitting: Cardiovascular Disease

## 2024-01-20 VITALS — BP 100/60 | HR 98 | Ht 67.5 in | Wt 128.0 lb

## 2024-01-20 DIAGNOSIS — Z720 Tobacco use: Secondary | ICD-10-CM | POA: Diagnosis not present

## 2024-01-20 DIAGNOSIS — E782 Mixed hyperlipidemia: Secondary | ICD-10-CM

## 2024-01-20 DIAGNOSIS — I739 Peripheral vascular disease, unspecified: Secondary | ICD-10-CM

## 2024-01-20 DIAGNOSIS — I10 Essential (primary) hypertension: Secondary | ICD-10-CM | POA: Diagnosis not present

## 2024-01-20 NOTE — Progress Notes (Signed)
 01/20/2024 Nathan JONELLE Cable Sr.   09-30-1950  [id number]  Primary Physician Borum, Corean GRADE, MD Primary Cardiologist: Dorn JINNY Lesches MD GENI CODY LYNITA ILAH  HPI:  Nathan JONELLE Cable Sr. is a 73 y.o.  thin appearing divorced African-American male father of 3, grandfather one grandchild referred by the [facility] for evaluation of claudication.    I last saw him in the office   08/28/2022.  He has a history of treated hypertension, untreated mild hyperlipidemia and greater than 50 pack years of tobacco abuse currently smoking 1 pack/day.  There is no family history of heart disease.  Never had a heart attack or stroke and denies chest pain or shortness of breath.  He has complained of right greater than left lower extremity lifestyle limiting claudication less than 1 block for the last year and was referred here for further evaluation of this.   I performed peripheral angiography 12/04/2017 revealing bilateral iliac disease as well as right common femoral SFA and popliteal disease.  I performed diamondback orbital rotational atherectomy, PTA and covered stenting (VBX) of his left common and external iliac artery.  His ABIs did not change but his velocities improved.  His claudication on the left has markedly improved as well.    He did have residual right common and external iliac artery calcified high-grade stenoses as well as an occluded right common femoral artery that was calcified, focal mid right SFA and popliteal artery stenosis.   I referred him to Dr. Serene who performed endarterectomy and patch angioplasty as well as diamondback orbital rotational atherectomy and stenting of his right common and external neck artery as well as his right common femoral artery with an excellent result.  His wound is well-healed.  His Dopplers have improved.  His claudication has resolved.  He is on aspirin  Plavix .  He is reduced his smoking from 1 pack to 1/2 pack/day, and most  recently to 1 pack/week.   Since I saw him in the office a year and a half ago he continues to do well.  He is still smoking 1 pack/week.  He denies chest pain.  Does have reactive airways disease on inhaled bronchodilator.  He denies claudication.  His Doppler suggest continued patency of his iliac stents.   Current Meds  Medication Sig   acetaminophen  (TYLENOL ) 500 MG tablet Take 500-1,000 mg by mouth 3 (three) times daily as needed for mild pain, headache or moderate pain.   albuterol  (VENTOLIN  HFA) 108 (90 Base) MCG/ACT inhaler Inhale into the lungs.   allopurinol  (ZYLOPRIM ) 300 MG tablet Take 300 mg by mouth daily.   amLODipine (NORVASC) 5 MG tablet Take 5 mg by mouth daily.   aspirin  EC 81 MG tablet Take 81 mg by mouth daily.   atorvastatin  (LIPITOR ) 80 MG tablet Take 1 tablet (80 mg total) by mouth daily at 6 PM.   buPROPion  (ZYBAN ) 150 MG 12 hr tablet Take 150 mg by mouth every other day.   clopidogrel  (PLAVIX ) 75 MG tablet Take 1 tablet (75 mg total) by mouth daily with breakfast.   diclofenac  Sodium (VOLTAREN ) 1 % GEL Apply 2 g topically 4 (four) times daily as needed (pain).   docusate sodium  (COLACE) 100 MG capsule Take 200 mg by mouth daily.   finasteride  (PROSCAR ) 5 MG tablet Take 5 mg by mouth daily.   fluticasone  (FLONASE ) 50 MCG/ACT nasal spray Place 2 sprays into both nostrils daily.   ipratropium (ATROVENT) 0.03 % nasal  spray Place into the nose.   latanoprost  (XALATAN ) 0.005 % ophthalmic solution Place 1 drop into both eyes at bedtime.   Mometasone  Furoate 200 MCG/ACT AERO Take by mouth.   montelukast (SINGULAIR) 10 MG tablet Take 1 tablet by mouth every evening.   nicotine  polacrilex (COMMIT) 2 MG lozenge Take by mouth.   omeprazole  (PRILOSEC  OTC) 20 MG tablet Take 1 tablet (20 mg total) by mouth daily.   sulfamethoxazole -trimethoprim  (BACTRIM  DS) 800-160 MG tablet Take 1 tablet by mouth 3 (three) times a week.   tamsulosin  (FLOMAX ) 0.4 MG CAPS capsule Take 2 capsules  by mouth daily.   Tiotropium Bromide-Olodaterol 2.5-2.5 MCG/ACT AERS Take by mouth.     No Known Allergies  Social History   Socioeconomic History   Marital status: Divorced    Spouse name: Not on file   Number of children: Not on file   Years of education: Not on file   Highest education level: High school graduate  Occupational History    Comment: Retired  Tobacco Use   Smoking status: Every Day    Current packs/day: 0.00    Average packs/day: 0.3 packs/day for 40.0 years (10.0 ttl pk-yrs)    Types: Cigarettes    Start date: 12/04/1977    Last attempt to quit: 12/04/2017    Years since quitting: 6.1   Smokeless tobacco: Never   Tobacco comments:    Smokes 3-4 cigs/ day  Vaping Use   Vaping status: Never Used  Substance and Sexual Activity   Alcohol use: Yes    Comment:  40 ounce a week   Drug use: Yes    Types: Marijuana    Comment: Smokes 1 blunt everyday   Sexual activity: Not on file  Other Topics Concern   Not on file  Social History Narrative   Not on file   Social Drivers of Health   Financial Resource Strain: Not on file  Food Insecurity: No Food Insecurity (08/11/2022)   Hunger Vital Sign    Worried About Running Out of Food in the Last Year: Never true    Ran Out of Food in the Last Year: Never true  Transportation Needs: No Transportation Needs (08/11/2022)   PRAPARE - Administrator, Civil Service (Medical): No    Lack of Transportation (Non-Medical): No  Physical Activity: Not on file  Stress: Not on file  Social Connections: Not on file  Intimate Partner Violence: Not At Risk (08/11/2022)   Humiliation, Afraid, Rape, and Kick questionnaire    Fear of Current or Ex-Partner: No    Emotionally Abused: No    Physically Abused: No    Sexually Abused: No     Review of Systems: General: negative for chills, fever, night sweats or weight changes.  Cardiovascular: negative for chest pain, dyspnea on exertion, edema, orthopnea, palpitations,  paroxysmal nocturnal dyspnea or shortness of breath Dermatological: negative for rash Respiratory: negative for cough or wheezing Urologic: negative for hematuria Abdominal: negative for nausea, vomiting, diarrhea, bright red blood per rectum, melena, or hematemesis Neurologic: negative for visual changes, syncope, or dizziness All other systems reviewed and are otherwise negative except as noted above.    Blood pressure 100/60, pulse 98, height 5' 7.5 (1.715 m), weight 128 lb (58.1 kg), SpO2 96%.  General appearance: alert and no distress Neck: no adenopathy, no carotid bruit, no JVD, supple, symmetrical, trachea midline, and thyroid not enlarged, symmetric, no tenderness/mass/nodules Lungs: clear to auscultation bilaterally Heart: regular rate and rhythm, S1,  S2 normal, no murmur, click, rub or gallop Extremities: extremities normal, atraumatic, no cyanosis or edema Pulses: 2+ and symmetric Skin: Skin color, texture, turgor normal. No rashes or lesions Neurologic: Grossly normal  EKG EKG Interpretation Date/Time:  Tuesday January 20 2024 15:49:18 EDT Ventricular Rate:  99 PR Interval:  174 QRS Duration:  62 QT Interval:  314 QTC Calculation: 402 R Axis:   23  Text Interpretation: Normal sinus rhythm Normal ECG When compared with ECG of 11-Aug-2022 18:54, PREVIOUS ECG IS PRESENT Confirmed by Court Carrier [phone]) on 01/20/2024 3:54:12 PM    ASSESSMENT AND PLAN:   Essential hypertension History of essential hypertension with blood pressure measured today at 100/60.  He is on amlodipine.   Claudication in peripheral vascular disease History of PAD status post peripheral angiography by myself 12/04/2017 revealing bilateral iliac disease as well as right common femoral, SFA and popliteal disease.  I performed diamondback orbital rotational atherectomy, PTA and covered stenting of his left common and external iliac arteries.  His ABIs did not change but his velocities improved as  his claudication on the left side.  Because of his residual right side disease I referred him to Dr. Serene who performed a right common femoral endarterectomy and patch angioplasty as well as orbital atherectomy and stenting of the right common and external iliac artery.  The claudication resolved.  We been following his Doppler studies which have demonstrated these to be widely patent as of June 2024.  Will repeat his Doppler studies.  Hyperlipidemia History of hyperlipidemia on statin therapy with lipid profile performed 03/12/2023 revealing a total cholesterol of 04/15/1930, LDL 38 and HDL of 76.  Tobacco abuse Ongoing tobacco abuse of 1 pack/week.     Carrier DOROTHA Court MD FACP,FACC,FAHA, [facility] 01/20/2024 4:06 PM

## 2024-01-20 NOTE — Assessment & Plan Note (Signed)
 History of essential hypertension with blood pressure measured today at 100/60.  He is on amlodipine.

## 2024-01-20 NOTE — Patient Instructions (Signed)
 Medication Instructions:  Your physician recommends that you continue on your current medications as directed. Please refer to the Current Medication list given to you today.  *If you need a refill on your cardiac medications before your next appointment, please call your pharmacy*   Testing/Procedures: Your physician has requested that you have an Aorta/Iliac Duplex. This will take place at [address] 4th floor  No food after 11PM the night before.  Water is OK. (Don't drink liquids if you have been instructed not to for ANOTHER test) Avoid foods that produce bowel gas, for 24 hours prior to exam (see below). No breakfast, no chewing gum, no smoking or carbonated beverages. Patient may take morning medications with water. Come in for test at least 15 minutes early to register.  Please note: We ask at that you not bring children with you during ultrasound (echo/ vascular) testing. Due to room size and safety concerns, children are not allowed in the ultrasound rooms during exams. Our front office staff cannot provide observation of children in our lobby area while testing is being conducted. An adult accompanying a patient to their appointment will only be allowed in the ultrasound room at the discretion of the ultrasound technician under special circumstances. We apologize for any inconvenience.  Your physician has requested that you have a lower extremity arterial duplex. During this test, ultrasound is used to evaluate arterial blood flow in the legs. Allow one hour for this exam. There are no restrictions or special instructions. This will take place at [address] 4th floor  Please note: We ask at that you not bring children with you during ultrasound (echo/ vascular) testing. Due to room size and safety concerns, children are not allowed in the ultrasound rooms during exams. Our front office staff cannot provide observation of children in our lobby area while testing is being  conducted. An adult accompanying a patient to their appointment will only be allowed in the ultrasound room at the discretion of the ultrasound technician under special circumstances. We apologize for any inconvenience.  Your physician has requested that you have an ankle brachial index (ABI). During this test an ultrasound and blood pressure cuff are used to evaluate the arteries that supply the arms and legs with blood. Allow thirty minutes for this exam. There are no restrictions or special instructions. This will take place at [address] 4th floor   Please note: We ask at that you not bring children with you during ultrasound (echo/ vascular) testing. Due to room size and safety concerns, children are not allowed in the ultrasound rooms during exams. Our front office staff cannot provide observation of children in our lobby area while testing is being conducted. An adult accompanying a patient to their appointment will only be allowed in the ultrasound room at the discretion of the ultrasound technician under special circumstances. We apologize for any inconvenience.   Follow-Up: At [facility], you and your health needs are our priority.  As part of our continuing mission to provide you with exceptional heart care, our providers are all part of one team.  This team includes your primary Cardiologist (physician) and Advanced Practice Providers or APPs (Physician Assistants and Nurse Practitioners) who all work together to provide you with the care you need, when you need it.  Your next appointment:   12 month(s)  Provider:   Dorn Lesches, MD    We recommend signing up for the patient portal called MyChart.  Sign up  information is provided on this After Visit Summary.  MyChart is used to connect with patients for Virtual Visits (Telemedicine).  Patients are able to view lab/test results, encounter notes, upcoming appointments, etc.  Non-urgent messages can be sent to your  provider as well.   To learn more about what you can do with MyChart, go to [email or web address].   Other Instructions

## 2024-01-20 NOTE — Assessment & Plan Note (Signed)
 History of hyperlipidemia on statin therapy with lipid profile performed 03/12/2023 revealing a total cholesterol of 04/15/1930, LDL 38 and HDL of 76.

## 2024-01-20 NOTE — Assessment & Plan Note (Signed)
Ongoing tobacco abuse of 1 pack/week

## 2024-01-20 NOTE — Assessment & Plan Note (Signed)
 History of PAD status post peripheral angiography by myself 12/04/2017 revealing bilateral iliac disease as well as right common femoral, SFA and popliteal disease.  I performed diamondback orbital rotational atherectomy, PTA and covered stenting of his left common and external iliac arteries.  His ABIs did not change but his velocities improved as his claudication on the left side.  Because of his residual right side disease I referred him to Dr. Serene who performed a right common femoral endarterectomy and patch angioplasty as well as orbital atherectomy and stenting of the right common and external iliac artery.  The claudication resolved.  We been following his Doppler studies which have demonstrated these to be widely patent as of June 2024.  Will repeat his Doppler studies.

## 2024-02-25 ENCOUNTER — Encounter (HOSPITAL_COMMUNITY)
